# Patient Record
Sex: Female | Born: 1990 | Race: White | Hispanic: No | Marital: Single | Smoking: Current every day smoker
Health system: Southern US, Community
[De-identification: ages and names within clinical notes are randomized; demographics above are authoritative.]

## PROBLEM LIST (undated history)

## (undated) DIAGNOSIS — I471 Supraventricular tachycardia, unspecified: Secondary | ICD-10-CM

## (undated) DIAGNOSIS — K219 Gastro-esophageal reflux disease without esophagitis: Secondary | ICD-10-CM

## (undated) DIAGNOSIS — M25569 Pain in unspecified knee: Secondary | ICD-10-CM

## (undated) DIAGNOSIS — N83209 Unspecified ovarian cyst, unspecified side: Secondary | ICD-10-CM

## (undated) DIAGNOSIS — Q760 Spina bifida occulta: Secondary | ICD-10-CM

## (undated) DIAGNOSIS — I499 Cardiac arrhythmia, unspecified: Secondary | ICD-10-CM

## (undated) DIAGNOSIS — I2699 Other pulmonary embolism without acute cor pulmonale: Secondary | ICD-10-CM

## (undated) DIAGNOSIS — F419 Anxiety disorder, unspecified: Secondary | ICD-10-CM

## (undated) DIAGNOSIS — T7840XA Allergy, unspecified, initial encounter: Secondary | ICD-10-CM

## (undated) DIAGNOSIS — I82439 Acute embolism and thrombosis of unspecified popliteal vein: Secondary | ICD-10-CM

## (undated) DIAGNOSIS — S82891A Other fracture of right lower leg, initial encounter for closed fracture: Secondary | ICD-10-CM

## (undated) DIAGNOSIS — I82409 Acute embolism and thrombosis of unspecified deep veins of unspecified lower extremity: Secondary | ICD-10-CM

## (undated) DIAGNOSIS — K429 Umbilical hernia without obstruction or gangrene: Secondary | ICD-10-CM

## (undated) DIAGNOSIS — R1032 Left lower quadrant pain: Secondary | ICD-10-CM

## (undated) DIAGNOSIS — K589 Irritable bowel syndrome without diarrhea: Secondary | ICD-10-CM

## (undated) DIAGNOSIS — R0609 Other forms of dyspnea: Secondary | ICD-10-CM

## (undated) DIAGNOSIS — R002 Palpitations: Secondary | ICD-10-CM

## (undated) DIAGNOSIS — E7212 Methylenetetrahydrofolate reductase deficiency: Secondary | ICD-10-CM

## (undated) DIAGNOSIS — M797 Fibromyalgia: Secondary | ICD-10-CM

## (undated) DIAGNOSIS — S82899A Other fracture of unspecified lower leg, initial encounter for closed fracture: Secondary | ICD-10-CM

## (undated) DIAGNOSIS — G43109 Migraine with aura, not intractable, without status migrainosus: Secondary | ICD-10-CM

## (undated) DIAGNOSIS — Z1589 Genetic susceptibility to other disease: Secondary | ICD-10-CM

## (undated) HISTORY — DX: Supraventricular tachycardia, unspecified: I47.10

## (undated) HISTORY — DX: Unspecified ovarian cyst, unspecified side: N83.209

## (undated) HISTORY — DX: Other fracture of unspecified lower leg, initial encounter for closed fracture: S82.899A

## (undated) HISTORY — PX: COLONOSCOPY: SHX174

## (undated) HISTORY — DX: Pain in unspecified knee: M25.569

## (undated) HISTORY — DX: Allergy, unspecified, initial encounter: T78.40XA

## (undated) HISTORY — DX: Supraventricular tachycardia: I47.1

## (undated) HISTORY — DX: Acute embolism and thrombosis of unspecified popliteal vein: I82.439

## (undated) HISTORY — DX: Umbilical hernia without obstruction or gangrene: K42.9

## (undated) HISTORY — DX: Left lower quadrant pain: R10.32

## (undated) HISTORY — DX: Irritable bowel syndrome, unspecified: K58.9

## (undated) HISTORY — PX: OVARIAN CYST SURGERY: SHX726

## (undated) HISTORY — DX: Other fracture of right lower leg, initial encounter for closed fracture: S82.891A

## (undated) HISTORY — DX: Other forms of dyspnea: R06.09

## (undated) HISTORY — DX: Migraine with aura, not intractable, without status migrainosus: G43.109

## (undated) HISTORY — DX: Palpitations: R00.2

## (undated) HISTORY — DX: Spina bifida occulta: Q76.0

## (undated) HISTORY — PX: UPPER GI ENDOSCOPY: SHX6162

## (undated) HISTORY — DX: Other pulmonary embolism without acute cor pulmonale: I26.99

---
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
## 2012-11-06 ENCOUNTER — Emergency Department: Payer: Self-pay | Admitting: Emergency Medicine

## 2012-11-06 LAB — CBC
HGB: 13.7 g/dL (ref 12.0–16.0)
MCH: 30.5 pg (ref 26.0–34.0)
MCHC: 32.8 g/dL (ref 32.0–36.0)
MCV: 93 fL (ref 80–100)

## 2012-11-06 LAB — COMPREHENSIVE METABOLIC PANEL
Albumin: 3.9 g/dL (ref 3.4–5.0)
BUN: 10 mg/dL (ref 7–18)
Chloride: 106 mmol/L (ref 98–107)
EGFR (African American): >60
Glucose: 87 mg/dL (ref 65–99)
Osmolality: 278 (ref 275–301)
Potassium: 3.6 mmol/L (ref 3.5–5.1)
SGOT(AST): 19 U/L (ref 15–37)
SGPT (ALT): 20 U/L (ref 12–78)
Sodium: 140 mmol/L (ref 136–145)
Total Protein: 7.7 g/dL (ref 6.4–8.2)

## 2012-11-06 LAB — URINALYSIS, COMPLETE
Bilirubin,UR: NEGATIVE
Glucose,UR: NEGATIVE mg/dL (ref 0–75)
Ketone: NEGATIVE
Ph: 8 (ref 4.5–8.0)
Protein: NEGATIVE
Specific Gravity: 1.017 (ref 1.003–1.030)
WBC UR: 2 /HPF (ref 0–5)

## 2013-02-02 ENCOUNTER — Emergency Department: Payer: Self-pay | Admitting: Internal Medicine

## 2013-02-27 ENCOUNTER — Emergency Department: Payer: Self-pay | Admitting: Emergency Medicine

## 2013-02-27 LAB — COMPREHENSIVE METABOLIC PANEL
Albumin: 3.9 g/dL (ref 3.4–5.0)
Calcium, Total: 8.7 mg/dL (ref 8.5–10.1)
Co2: 24 mmol/L (ref 21–32)
Creatinine: 0.76 mg/dL (ref 0.60–1.30)
EGFR (African American): >60
Glucose: 83 mg/dL (ref 65–99)
Osmolality: 282 (ref 275–301)
SGOT(AST): 20 U/L (ref 15–37)
SGPT (ALT): 17 U/L (ref 12–78)
Sodium: 142 mmol/L (ref 136–145)
Total Protein: 7.4 g/dL (ref 6.4–8.2)

## 2013-02-27 LAB — URINALYSIS, COMPLETE
Bilirubin,UR: NEGATIVE
Blood: NEGATIVE
Glucose,UR: NEGATIVE mg/dL (ref 0–75)
Ketone: NEGATIVE
Nitrite: NEGATIVE
Ph: 6 (ref 4.5–8.0)
RBC,UR: <1 /HPF (ref 0–5)
Specific Gravity: 1.021 (ref 1.003–1.030)
Squamous Epithelial: 2

## 2013-02-27 LAB — CBC
HCT: 38.2 % (ref 35.0–47.0)
HGB: 13.4 g/dL (ref 12.0–16.0)
MCHC: 34.9 g/dL (ref 32.0–36.0)
MCV: 90 fL (ref 80–100)
RBC: 4.25 10*6/uL (ref 3.80–5.20)
WBC: 7 10*3/uL (ref 3.6–11.0)

## 2013-03-01 ENCOUNTER — Ambulatory Visit: Payer: Self-pay | Admitting: Family Medicine

## 2013-03-01 LAB — CBC WITH DIFFERENTIAL/PLATELET
Basophil #: 0 10*3/uL (ref 0.0–0.1)
Basophil %: 0.4 %
Eosinophil #: 0.1 10*3/uL (ref 0.0–0.7)
Eosinophil %: 1.8 %
HCT: 39.3 % (ref 35.0–47.0)
HGB: 13.3 g/dL (ref 12.0–16.0)
Lymphocyte #: 2.3 10*3/uL (ref 1.0–3.6)
Lymphocyte %: 29.8 %
MCHC: 34 g/dL (ref 32.0–36.0)
MCV: 91 fL (ref 80–100)
Monocyte #: 0.5 x10 3/mm (ref 0.2–0.9)
Neutrophil %: 61.9 %
Platelet: 158 10*3/uL (ref 150–440)
RDW: 12.7 % (ref 11.5–14.5)
WBC: 7.9 10*3/uL (ref 3.6–11.0)

## 2013-03-01 LAB — BASIC METABOLIC PANEL
Anion Gap: 6 — ABNORMAL LOW (ref 7–16)
BUN: 14 mg/dL (ref 7–18)
Calcium, Total: 8.5 mg/dL (ref 8.5–10.1)
Chloride: 111 mmol/L — ABNORMAL HIGH (ref 98–107)
Co2: 25 mmol/L (ref 21–32)
EGFR (Non-African Amer.): >60
Glucose: 83 mg/dL (ref 65–99)
Osmolality: 283 (ref 275–301)
Potassium: 3.4 mmol/L — ABNORMAL LOW (ref 3.5–5.1)
Sodium: 142 mmol/L (ref 136–145)

## 2013-03-01 LAB — HEPATIC FUNCTION PANEL A (ARMC)
Albumin: 4 g/dL (ref 3.4–5.0)
Alkaline Phosphatase: 72 U/L (ref 50–136)
Bilirubin, Direct: 0.3 mg/dL — ABNORMAL HIGH (ref 0.00–0.20)
SGOT(AST): 20 U/L (ref 15–37)
SGPT (ALT): 18 U/L (ref 12–78)
Total Protein: 7.4 g/dL (ref 6.4–8.2)

## 2013-03-01 LAB — AMYLASE: Amylase: 41 U/L (ref 25–115)

## 2013-03-02 ENCOUNTER — Emergency Department: Payer: Self-pay | Admitting: Unknown Physician Specialty

## 2013-03-02 LAB — CBC
MCH: 31.5 pg (ref 26.0–34.0)
MCV: 91 fL (ref 80–100)
Platelet: 139 10*3/uL — ABNORMAL LOW (ref 150–440)
RBC: 4.12 10*6/uL (ref 3.80–5.20)
RDW: 12.5 % (ref 11.5–14.5)
WBC: 8.7 10*3/uL (ref 3.6–11.0)

## 2013-03-02 LAB — COMPREHENSIVE METABOLIC PANEL
Albumin: 3.8 g/dL (ref 3.4–5.0)
Alkaline Phosphatase: 78 U/L (ref 50–136)
Anion Gap: 6 — ABNORMAL LOW (ref 7–16)
BUN: 11 mg/dL (ref 7–18)
Bilirubin,Total: 2 mg/dL — ABNORMAL HIGH (ref 0.2–1.0)
Creatinine: 0.77 mg/dL (ref 0.60–1.30)
EGFR (African American): >60
EGFR (Non-African Amer.): >60
Osmolality: 279 (ref 275–301)
Potassium: 3.5 mmol/L (ref 3.5–5.1)
Sodium: 141 mmol/L (ref 136–145)
Total Protein: 7.1 g/dL (ref 6.4–8.2)

## 2013-03-02 LAB — SEDIMENTATION RATE: Erythrocyte Sed Rate: 6 mm/hr (ref 0–20)

## 2013-03-02 LAB — LIPASE, BLOOD: Lipase: 123 U/L (ref 73–393)

## 2013-03-16 ENCOUNTER — Inpatient Hospital Stay: Payer: Self-pay | Admitting: Surgery

## 2013-03-16 LAB — URINALYSIS, COMPLETE
Bilirubin,UR: NEGATIVE
Blood: NEGATIVE
Ketone: NEGATIVE
Nitrite: NEGATIVE
Ph: 8 (ref 4.5–8.0)
Protein: NEGATIVE
RBC,UR: 1 /HPF (ref 0–5)
Specific Gravity: 1.02 (ref 1.003–1.030)
Squamous Epithelial: 1

## 2013-03-16 LAB — CBC
HCT: 38.6 % (ref 35.0–47.0)
MCH: 31.5 pg (ref 26.0–34.0)
MCV: 91 fL (ref 80–100)
Platelet: 133 10*3/uL — ABNORMAL LOW (ref 150–440)
RDW: 12.7 % (ref 11.5–14.5)
WBC: 6.2 10*3/uL (ref 3.6–11.0)

## 2013-03-16 LAB — COMPREHENSIVE METABOLIC PANEL
Alkaline Phosphatase: 60 U/L (ref 50–136)
Anion Gap: 6 — ABNORMAL LOW (ref 7–16)
Calcium, Total: 8.6 mg/dL (ref 8.5–10.1)
Co2: 27 mmol/L (ref 21–32)
Creatinine: 0.78 mg/dL (ref 0.60–1.30)
EGFR (African American): >60
Glucose: 76 mg/dL (ref 65–99)
Potassium: 3.8 mmol/L (ref 3.5–5.1)
SGOT(AST): 23 U/L (ref 15–37)

## 2013-03-16 LAB — LIPASE, BLOOD: Lipase: 143 U/L (ref 73–393)

## 2013-03-17 LAB — COMPREHENSIVE METABOLIC PANEL
Albumin: 3.8 g/dL (ref 3.4–5.0)
Alkaline Phosphatase: 63 U/L (ref 50–136)
Anion Gap: 6 — ABNORMAL LOW (ref 7–16)
Bilirubin,Total: 1.8 mg/dL — ABNORMAL HIGH (ref 0.2–1.0)
Co2: 27 mmol/L (ref 21–32)
EGFR (Non-African Amer.): >60
Glucose: 78 mg/dL (ref 65–99)
Osmolality: 280 (ref 275–301)
Potassium: 4.1 mmol/L (ref 3.5–5.1)
SGPT (ALT): 152 U/L — ABNORMAL HIGH (ref 12–78)
Sodium: 141 mmol/L (ref 136–145)
Total Protein: 7 g/dL (ref 6.4–8.2)

## 2013-03-17 LAB — CBC WITH DIFFERENTIAL/PLATELET
Comment - H1-Com1: NORMAL
Eosinophil: 5 %
HCT: 40.1 % (ref 35.0–47.0)
Lymphocytes: 44 %
MCH: 31.1 pg (ref 26.0–34.0)
MCHC: 34.3 g/dL (ref 32.0–36.0)
MCV: 91 fL (ref 80–100)
Platelet: 139 10*3/uL — ABNORMAL LOW (ref 150–440)
RBC: 4.42 10*6/uL (ref 3.80–5.20)
RDW: 12.8 % (ref 11.5–14.5)
WBC: 6.4 10*3/uL (ref 3.6–11.0)

## 2013-03-22 ENCOUNTER — Emergency Department: Payer: Self-pay | Admitting: Emergency Medicine

## 2013-03-22 ENCOUNTER — Ambulatory Visit: Payer: Self-pay | Admitting: Gastroenterology

## 2013-03-22 LAB — CBC WITH DIFFERENTIAL/PLATELET
Basophil #: 0 10*3/uL (ref 0.0–0.1)
Basophil %: 0.4 %
Eosinophil #: 0.3 10*3/uL (ref 0.0–0.7)
Eosinophil %: 3.9 %
HCT: 39.1 % (ref 35.0–47.0)
HGB: 13.4 g/dL (ref 12.0–16.0)
Lymphocyte #: 3.1 10*3/uL (ref 1.0–3.6)
MCV: 90 fL (ref 80–100)
Monocyte #: 0.5 x10 3/mm (ref 0.2–0.9)
Monocyte %: 6.6 %
Neutrophil #: 3.2 10*3/uL (ref 1.4–6.5)
Neutrophil %: 45.3 %
Platelet: 133 10*3/uL — ABNORMAL LOW (ref 150–440)
RDW: 12.9 % (ref 11.5–14.5)

## 2013-03-22 LAB — URINALYSIS, COMPLETE
Glucose,UR: NEGATIVE mg/dL (ref 0–75)
Ketone: NEGATIVE
Leukocyte Esterase: NEGATIVE
Nitrite: NEGATIVE
Protein: NEGATIVE
Squamous Epithelial: NONE SEEN

## 2013-03-22 LAB — GC/CHLAMYDIA PROBE AMP

## 2013-05-12 ENCOUNTER — Emergency Department: Payer: Self-pay | Admitting: Emergency Medicine

## 2013-05-12 LAB — COMPREHENSIVE METABOLIC PANEL
Alkaline Phosphatase: 83 U/L (ref 50–136)
Anion Gap: 6 — ABNORMAL LOW (ref 7–16)
BUN: 11 mg/dL (ref 7–18)
Bilirubin,Total: 0.8 mg/dL (ref 0.2–1.0)
Calcium, Total: 8.9 mg/dL (ref 8.5–10.1)
Chloride: 109 mmol/L — ABNORMAL HIGH (ref 98–107)
EGFR (African American): >60
EGFR (Non-African Amer.): >60
Glucose: 82 mg/dL (ref 65–99)
Osmolality: 278 (ref 275–301)
SGOT(AST): 17 U/L (ref 15–37)
SGPT (ALT): 14 U/L (ref 12–78)
Total Protein: 7.5 g/dL (ref 6.4–8.2)

## 2013-05-12 LAB — CBC
HCT: 39.1 % (ref 35.0–47.0)
HGB: 13.3 g/dL (ref 12.0–16.0)
Platelet: 149 10*3/uL — ABNORMAL LOW (ref 150–440)
RDW: 12.3 % (ref 11.5–14.5)
WBC: 7.2 10*3/uL (ref 3.6–11.0)

## 2013-06-01 ENCOUNTER — Emergency Department: Payer: Self-pay | Admitting: Emergency Medicine

## 2013-06-01 LAB — BASIC METABOLIC PANEL
BUN: 12 mg/dL (ref 7–18)
Calcium, Total: 8.9 mg/dL (ref 8.5–10.1)
Chloride: 110 mmol/L — ABNORMAL HIGH (ref 98–107)
EGFR (African American): >60
EGFR (Non-African Amer.): >60
Glucose: 106 mg/dL — ABNORMAL HIGH (ref 65–99)
Osmolality: 280 (ref 275–301)
Potassium: 3.8 mmol/L (ref 3.5–5.1)

## 2013-06-01 LAB — CBC
HCT: 38.2 % (ref 35.0–47.0)
HGB: 13.1 g/dL (ref 12.0–16.0)
MCH: 31.2 pg (ref 26.0–34.0)
MCHC: 34.3 g/dL (ref 32.0–36.0)
MCV: 91 fL (ref 80–100)
Platelet: 153 10*3/uL (ref 150–440)
WBC: 7.7 10*3/uL (ref 3.6–11.0)

## 2013-06-01 LAB — TROPONIN I: Troponin-I: <0.02 ng/mL

## 2013-07-14 ENCOUNTER — Emergency Department: Payer: Self-pay | Admitting: Internal Medicine

## 2013-08-04 ENCOUNTER — Emergency Department: Payer: Self-pay | Admitting: Emergency Medicine

## 2013-08-23 ENCOUNTER — Emergency Department: Payer: Self-pay | Admitting: Emergency Medicine

## 2013-08-24 HISTORY — PX: APPENDECTOMY: SHX54

## 2013-08-24 HISTORY — PX: KNEE ARTHROSCOPY: SUR90

## 2013-09-17 ENCOUNTER — Emergency Department: Payer: Self-pay | Admitting: Emergency Medicine

## 2013-09-17 LAB — CBC
HCT: 41.4 % (ref 35.0–47.0)
HGB: 13.9 g/dL (ref 12.0–16.0)
MCH: 30.6 pg (ref 26.0–34.0)
MCHC: 33.6 g/dL (ref 32.0–36.0)
MCV: 91 fL (ref 80–100)
PLATELETS: 154 10*3/uL (ref 150–440)
RBC: 4.54 10*6/uL (ref 3.80–5.20)
RDW: 12.2 % (ref 11.5–14.5)
WBC: 6.9 10*3/uL (ref 3.6–11.0)

## 2013-09-17 LAB — COMPREHENSIVE METABOLIC PANEL
ALK PHOS: 71 U/L
Albumin: 3.9 g/dL (ref 3.4–5.0)
Anion Gap: 7 (ref 7–16)
BUN: 13 mg/dL (ref 7–18)
Bilirubin,Total: 0.4 mg/dL (ref 0.2–1.0)
Calcium, Total: 8.8 mg/dL (ref 8.5–10.1)
Chloride: 109 mmol/L — ABNORMAL HIGH (ref 98–107)
Co2: 22 mmol/L (ref 21–32)
Creatinine: 0.66 mg/dL (ref 0.60–1.30)
EGFR (African American): >60
EGFR (Non-African Amer.): >60
GLUCOSE: 86 mg/dL (ref 65–99)
Osmolality: 275 (ref 275–301)
POTASSIUM: 3.3 mmol/L — AB (ref 3.5–5.1)
SGOT(AST): 21 U/L (ref 15–37)
SGPT (ALT): 15 U/L (ref 12–78)
Sodium: 138 mmol/L (ref 136–145)
TOTAL PROTEIN: 7.5 g/dL (ref 6.4–8.2)

## 2013-09-17 LAB — TROPONIN I: Troponin-I: <0.02 ng/mL

## 2013-09-25 ENCOUNTER — Emergency Department: Payer: Self-pay | Admitting: Emergency Medicine

## 2013-11-08 ENCOUNTER — Ambulatory Visit: Payer: Self-pay | Admitting: Surgery

## 2013-11-13 ENCOUNTER — Ambulatory Visit: Payer: Self-pay | Admitting: Surgery

## 2013-11-14 ENCOUNTER — Emergency Department: Payer: Self-pay | Admitting: Emergency Medicine

## 2013-11-14 LAB — COMPREHENSIVE METABOLIC PANEL
ALT: 52 U/L (ref 12–78)
ANION GAP: 7 (ref 7–16)
Albumin: 4 g/dL (ref 3.4–5.0)
Alkaline Phosphatase: 76 U/L
BILIRUBIN TOTAL: 2.2 mg/dL — AB (ref 0.2–1.0)
BUN: 17 mg/dL (ref 7–18)
CO2: 23 mmol/L (ref 21–32)
Calcium, Total: 8.5 mg/dL (ref 8.5–10.1)
Chloride: 108 mmol/L — ABNORMAL HIGH (ref 98–107)
Creatinine: 0.84 mg/dL (ref 0.60–1.30)
Glucose: 85 mg/dL (ref 65–99)
Osmolality: 276 (ref 275–301)
Potassium: 3 mmol/L — ABNORMAL LOW (ref 3.5–5.1)
SGOT(AST): 85 U/L — ABNORMAL HIGH (ref 15–37)
Sodium: 138 mmol/L (ref 136–145)
Total Protein: 7.5 g/dL (ref 6.4–8.2)

## 2013-11-14 LAB — DRUG SCREEN, URINE
AMPHETAMINES, UR SCREEN: NEGATIVE (ref ?–1000)
BARBITURATES, UR SCREEN: NEGATIVE (ref ?–200)
Benzodiazepine, Ur Scrn: NEGATIVE (ref ?–200)
Cannabinoid 50 Ng, Ur ~~LOC~~: NEGATIVE (ref ?–50)
Cocaine Metabolite,Ur ~~LOC~~: NEGATIVE (ref ?–300)
MDMA (ECSTASY) UR SCREEN: NEGATIVE (ref ?–500)
METHADONE, UR SCREEN: NEGATIVE (ref ?–300)
OPIATE, UR SCREEN: NEGATIVE (ref ?–300)
PHENCYCLIDINE (PCP) UR S: NEGATIVE (ref ?–25)
TRICYCLIC, UR SCREEN: NEGATIVE (ref ?–1000)

## 2013-11-14 LAB — URINALYSIS, COMPLETE
BILIRUBIN, UR: NEGATIVE
Bacteria: NONE SEEN
Blood: NEGATIVE
Glucose,UR: NEGATIVE mg/dL (ref 0–75)
LEUKOCYTE ESTERASE: NEGATIVE
Nitrite: NEGATIVE
PH: 5 (ref 4.5–8.0)
Protein: NEGATIVE
RBC,UR: 1 /HPF (ref 0–5)
SPECIFIC GRAVITY: 1.028 (ref 1.003–1.030)

## 2013-11-14 LAB — LIPASE, BLOOD: LIPASE: 212 U/L (ref 73–393)

## 2013-11-14 LAB — CBC
HCT: 41.1 % (ref 35.0–47.0)
HGB: 13.8 g/dL (ref 12.0–16.0)
MCH: 30.6 pg (ref 26.0–34.0)
MCHC: 33.5 g/dL (ref 32.0–36.0)
MCV: 92 fL (ref 80–100)
Platelet: 115 10*3/uL — ABNORMAL LOW (ref 150–440)
RBC: 4.5 10*6/uL (ref 3.80–5.20)
RDW: 12.6 % (ref 11.5–14.5)
WBC: 6.6 10*3/uL (ref 3.6–11.0)

## 2013-11-14 LAB — TROPONIN I

## 2013-12-18 ENCOUNTER — Ambulatory Visit: Payer: Self-pay | Admitting: Surgery

## 2013-12-18 LAB — CBC WITH DIFFERENTIAL/PLATELET
Basophil #: 0 10*3/uL (ref 0.0–0.1)
Basophil %: 0.6 %
Eosinophil #: 0.4 10*3/uL (ref 0.0–0.7)
Eosinophil %: 5.1 %
HCT: 39.8 % (ref 35.0–47.0)
HGB: 13.6 g/dL (ref 12.0–16.0)
Lymphocyte #: 2.1 10*3/uL (ref 1.0–3.6)
Lymphocyte %: 28.6 %
MCH: 31 pg (ref 26.0–34.0)
MCHC: 34.1 g/dL (ref 32.0–36.0)
MCV: 91 fL (ref 80–100)
MONO ABS: 0.4 x10 3/mm (ref 0.2–0.9)
Monocyte %: 5.9 %
Neutrophil #: 4.4 10*3/uL (ref 1.4–6.5)
Neutrophil %: 59.8 %
PLATELETS: 153 10*3/uL (ref 150–440)
RBC: 4.38 10*6/uL (ref 3.80–5.20)
RDW: 12.4 % (ref 11.5–14.5)
WBC: 7.3 10*3/uL (ref 3.6–11.0)

## 2013-12-18 LAB — BASIC METABOLIC PANEL
Anion Gap: 7 (ref 7–16)
BUN: 13 mg/dL (ref 7–18)
Calcium, Total: 8.8 mg/dL (ref 8.5–10.1)
Chloride: 109 mmol/L — ABNORMAL HIGH (ref 98–107)
Co2: 25 mmol/L (ref 21–32)
Creatinine: 0.52 mg/dL — ABNORMAL LOW (ref 0.60–1.30)
EGFR (African American): >60
EGFR (Non-African Amer.): >60
Glucose: 78 mg/dL (ref 65–99)
OSMOLALITY: 280 (ref 275–301)
Potassium: 3.9 mmol/L (ref 3.5–5.1)
Sodium: 141 mmol/L (ref 136–145)

## 2013-12-25 ENCOUNTER — Ambulatory Visit: Payer: Self-pay | Admitting: Surgery

## 2014-01-18 ENCOUNTER — Other Ambulatory Visit: Payer: Self-pay | Admitting: Neurology

## 2014-02-26 ENCOUNTER — Emergency Department: Payer: Self-pay | Admitting: Emergency Medicine

## 2014-05-15 ENCOUNTER — Ambulatory Visit: Payer: Self-pay | Admitting: Family Medicine

## 2014-06-15 ENCOUNTER — Ambulatory Visit: Payer: Self-pay

## 2014-06-21 ENCOUNTER — Ambulatory Visit: Payer: Self-pay | Admitting: Gastroenterology

## 2014-06-23 ENCOUNTER — Emergency Department: Payer: Self-pay | Admitting: Emergency Medicine

## 2014-07-02 ENCOUNTER — Ambulatory Visit: Payer: Self-pay | Admitting: Family Medicine

## 2014-07-20 ENCOUNTER — Emergency Department: Payer: Self-pay | Admitting: Emergency Medicine

## 2014-07-20 LAB — HEPATIC FUNCTION PANEL A (ARMC)
ALBUMIN: 3.9 g/dL (ref 3.4–5.0)
ALK PHOS: 80 U/L
BILIRUBIN DIRECT: 0.2 mg/dL (ref 0.0–0.2)
BILIRUBIN TOTAL: 1.1 mg/dL — AB (ref 0.2–1.0)
SGOT(AST): 13 U/L — ABNORMAL LOW (ref 15–37)
SGPT (ALT): 17 U/L
TOTAL PROTEIN: 7.3 g/dL (ref 6.4–8.2)

## 2014-07-20 LAB — URINALYSIS, COMPLETE
BILIRUBIN, UR: NEGATIVE
BLOOD: NEGATIVE
GLUCOSE, UR: NEGATIVE mg/dL (ref 0–75)
Leukocyte Esterase: NEGATIVE
NITRITE: NEGATIVE
PROTEIN: NEGATIVE
Ph: 5 (ref 4.5–8.0)
RBC,UR: 2 /HPF (ref 0–5)
SPECIFIC GRAVITY: 1.023 (ref 1.003–1.030)
WBC UR: 1 /HPF (ref 0–5)

## 2014-07-20 LAB — CBC
HCT: 39.9 % (ref 35.0–47.0)
HGB: 13.1 g/dL (ref 12.0–16.0)
MCH: 30.5 pg (ref 26.0–34.0)
MCHC: 33 g/dL (ref 32.0–36.0)
MCV: 93 fL (ref 80–100)
PLATELETS: 149 10*3/uL — AB (ref 150–440)
RBC: 4.3 10*6/uL (ref 3.80–5.20)
RDW: 12.4 % (ref 11.5–14.5)
WBC: 8.2 10*3/uL (ref 3.6–11.0)

## 2014-07-20 LAB — BASIC METABOLIC PANEL
ANION GAP: 7 (ref 7–16)
BUN: 12 mg/dL (ref 7–18)
Calcium, Total: 8.5 mg/dL (ref 8.5–10.1)
Chloride: 109 mmol/L — ABNORMAL HIGH (ref 98–107)
Co2: 24 mmol/L (ref 21–32)
Creatinine: 0.74 mg/dL (ref 0.60–1.30)
EGFR (Non-African Amer.): >60
Glucose: 91 mg/dL (ref 65–99)
Osmolality: 279 (ref 275–301)
POTASSIUM: 3.3 mmol/L — AB (ref 3.5–5.1)
SODIUM: 140 mmol/L (ref 136–145)

## 2014-07-20 LAB — LIPASE, BLOOD: Lipase: 155 U/L (ref 73–393)

## 2014-07-20 LAB — TROPONIN I

## 2014-08-24 HISTORY — PX: HERNIA REPAIR: SHX51

## 2014-10-16 DIAGNOSIS — M25569 Pain in unspecified knee: Secondary | ICD-10-CM

## 2014-10-16 HISTORY — DX: Pain in unspecified knee: M25.569

## 2014-11-05 ENCOUNTER — Emergency Department: Payer: Self-pay | Admitting: Student

## 2014-12-14 NOTE — Consult Note (Signed)
Brief Consult Note: Diagnosis: RLQ pain.   Patient was seen by consultant.   Discussed with Attending MD.   Comments: 5 day h/o RLQ pain, getting worse, no change in character or location. Assoc w/ nausea, no vomiting, and 3 days of diarrhea. Stool color now almost white. No fever. No sick contacts. abdomen slightly distended, completely soft and very non-tender, even to forceful RLQ palpation. No rebound, no guarding, no peritoneal irritation. Reviewed CT and report. Extremely low probability for acute appendicitis. Advised hydration, avoiding milk and milk products for 1 - 2 weeks, return for fever (> 100.5), and f/u w/ GI doc as scheduled in 13 days.  Electronic Signatures: Consuela Mimes (MD)  (Signed 10-Jul-14 18:55)  Authored: Brief Consult Note   Last Updated: 10-Jul-14 18:55 by Consuela Mimes (MD)

## 2014-12-14 NOTE — H&P (Signed)
   Subjective/Chief Complaint RLQ pain x 3 weeks, subjective chills, poor appetite, ? pericecal mass vs appendix vs ovary on CT   History of Present Illness Ellen Jackson is a pleasant 24 yo F with a history of ovarian cysts and cesarian sections who presents with now 2-3 weeks of RLQ pain.  She presented initially on July 7.  Ultrasound did not visualize right ovary and CT scan showed pericecal soft tissue mass.  clinically not felt to have appendicitis at that time.  Since then she has seen Dr. Star Age of [facility] who felt that this was not consistent with ovarian cysts.  She has subjective chills, Tempt to 99.6.  Poor appetite.  Has been constipated secondary to PO pain meds and has been taken miralax.  Is convinced that it is her appendix.  CT shows similar findings as before.   Past History s/p cesarian sections x 2 h/o ovarian cysts h/o MTHFR heterogenicity s/p cholecystectomy   Past Med/Surgical Hx:  MTHFR:   Ovarian Cyst:   Irritable Bowel Syndrome:   Migraines:   Cyst on Ovaries:   SVT:   gallbladder removed:   ALLERGIES:  Morphine: Chest Pain, Hives  nubain: Resp. Distress  shakes,  allegery to compaizine: Other, Agitation  ondansetron: Hives  Codeine: Dizzy/Fainting  Latex: Hives, Itching  Reglan: Other  Phenergan: Other  Family and Social History:  Family History Hypertension  Diabetes Mellitus  Cancer  F/h of MTHFR mutation in mother, pancreatitic cancer, hereditary pancreatitis   Social History positive  tobacco, negative ETOH, 2-3 cigarettes per day   + Tobacco Current (within 1 year)   Place of Living [place] children   Review of Systems:  Subjective/Chief Complaint RLQ pain, anorexia, subjective chillls   Fever/Chills Yes   Cough No   Sputum No   Abdominal Pain Yes   Diarrhea No   Constipation Yes   Nausea/Vomiting Yes   SOB/DOE No   Chest Pain No   Dysuria No   Tolerating Diet No  Nauseated   Physical Exam:  GEN well  developed, well nourished, no acute distress, thin   HEENT pink conjunctivae, PERRL, hearing intact to voice   RESP normal resp effort  clear BS  no use of accessory muscles   CARD regular rate  no murmur  no thrills  No LE edema   ABD positive tenderness  denies Flank Tenderness  no liver/spleen enlargement  no hernia  soft  distended  normal BS  no Adominal Mass   SKIN normal to palpation, No rashes, No ulcers   NEURO cranial nerves intact, negative Babinski R/L, negative rigidity, negative tremor, follows commands   PSYCH A+O to time, place, person, good insight, anxious    Assessment/Admission Diagnosis Ms. Penix is a pleasant 24 yo F with persistent RLQ pain.  ? ovarian cyst.  Low suspicion for appendicitis but will consider diagnostic laparoscopy with appendectomy.  Would like GYN input and availability in the case of non general surgery finding.   Plan Admit for workup of RLQ pain, GYN consult, possible diagnostic laparoscopy with appendectomy.   Electronic Signatures: Floyde Parkins (MD)  (Signed 24-Jul-14 17:42)  Authored: CHIEF COMPLAINT and HISTORY, PAST MEDICAL/SURGIAL HISTORY, ALLERGIES, FAMILY AND SOCIAL HISTORY, REVIEW OF SYSTEMS, PHYSICAL EXAM, ASSESSMENT AND PLAN   Last Updated: 24-Jul-14 17:42 by Floyde Parkins (MD)

## 2014-12-14 NOTE — Consult Note (Signed)
Brief Consult Note: Diagnosis: pelvic pain right sided.   Patient was seen by consultant.   Consult note dictated.   Comments: doubt ovarian cyst etiology of the pain, the cyst is only 1.3 cm. no signs of torsion or restricted blood flow.  Electronic Signatures: Erik Obey (MD)  (Signed 24-Jul-14 22:39)  Authored: Brief Consult Note   Last Updated: 24-Jul-14 22:39 by Erik Obey (MD)

## 2014-12-14 NOTE — Op Note (Signed)
PATIENT NAME:  Ellen Jackson, Ellen Jackson MR#:  [id number] DATE OF BIRTH:  09/12/1990  DATE OF PROCEDURE:  03/17/2013  PREOPERATIVE DIAGNOSIS:  Right lower quadrant pain, ovarian cyst on ultrasound, questionable right pelvic mass.   POSTOPERATIVE DIAGNOSIS:  Right lower quadrant pain.   PROCEDURE PERFORMED:  Diagnostic laparoscopy and appendectomy.   ANESTHESIA:  General.   SPECIMENS:  Appendix.   INTRAOPERATIVE FINDINGS:  Unremarkable appendix.  No pelvic mass on exploration.  Normal but "boggy" uterus.  Normal ovaries.  No Meckel's diverticulum.   SURGEON:  Dr. Rexene Edison.   ASSISTANTGardiner Sleeper, PA, student.   INTRAOPERATIVE CONSULTATIONS:  Dr. Ferne Reus for evaluation of the uterus and adnexal structures.   ESTIMATED BLOOD LOSS:  Minimal.   COMPLICATIONS:  None.   INDICATION FOR SURGERY:  Ellen Jackson is a pleasant 24 year old female with recurrent persistent right lower quadrant pain.  She had been evaluated previously on multiple occasions in our Emergency Room and there was concern for a right lower quadrant pelvic mass which could be causing her pain.  There was also a concern that this could be due to a periappendiceal abscess or periappendiceal mass as her pain did not resolve.  I had spoken with her about diagnostic laparoscopy with incidental appendectomy and management dependent on operative findings.   DETAILS OF PROCEDURE:  Informed consent was obtained.  Ellen Jackson was laid supine on the Operating Room table.  She was induced.  Endotracheal tube was placed, general anesthesia was administered.  Her abdomen was then prepped and draped in a standard surgical fashion.  A timeout was then performed correctly identifying patient name, operative site and procedure to be performed.  A supraumbilical incision was made.  This was deepened down to the fascia.  The fascia was incised.  The peritoneum was entered.  Two stay sutures were placed through the fasciotomy and a Hassan trocar was placed in the  abdomen.  The abdomen was insufflated.  A left lower quadrant and trocars were placed.  I began my examination by evaluating the patient's appendix at the base of the cecum.  Cecum was in the pelvis.  Appendix did not appear to be grossly inflamed, however on multiple occasions it had been discussed that the appendix may be a source of pain and therefore I did remove her appendix laparoscopically.  I used a IT consultant to place a hole in the base of the mesoappendix.  I then placed an endoscopic stapler with a blue load across the base of the appendix.  I then used a single fire of a laparoscopic white load to take the mesoappendix.  I then evaluated the appendiceal stump and mesoappendix and found that it was hemostatic.  I then ran approximately 2 to 3 feet of ileum to look for Meckel's diverticulum and also to look for creeping fat associated with Crohn's.  This bowel was unremarkable.  I then focused my attention to the pelvic floor and could follow the sigmoid colon down to the pelvic outlet and did not see any obvious masses.  A looked extensively at both pelvic sidewalls and did not see any masses.  I then used a grasper to lift up the uterus and looked all around the uterus.  I did evaluate her uterus.  I also evaluated her ovaries.  There were no obvious abnormalities.  There were no obvious chocolate cysts associated with endometriosis on the ovaries or adnexal or anywhere in the pelvis.  I then looked completely around her abdomen and did  not see any obvious abnormalities.  There are no hernias.  No adhesions, relatively whatsoever.  I then consulted Dr. Ferne Reus with gynecology to evaluate her adnexal structures and uterus to ensure that were not abnormalities beyond what I was able to see.  She did mention that the uterus was slightly boggy, but there were no obvious abnormalities.  No chocolate cyst.  Did mention there may be the beginning of a corpus luteum cyst on the patient's right ovary,  but otherwise no obvious etiology for patient's pain.  At that time I elected to remove all trocars and decrease the patient's pneumoperitoneum.  I did close the supraumbilical fascia site with a figure-of-eight 0 Vicryl suture in ER 6.  I then closed all skin incisions with an inverted 4-0 Monocryl deep dermal suture.  I then placed dressings over the wounds consisting of Steri-Strips, Telfa gauze and Tegaderm.  The patient was then awoken, extubated and brought to the postanesthesia care unit.  There were no immediate complications.  Needle, sponge and instrument counts were correct at the end of the procedure.    ____________________________ Ellen Jackson. Ellen Houpt, MD cal:ea D: 03/17/2013 18:00:43 ET T: 03/18/2013 02:19:08 ET JOB#: [id number]  cc: Harrell Gave A. Jozalyn Baglio, MD, <Dictator> Floyde Parkins MD ELECTRONICALLY SIGNED 03/18/2013 11:31

## 2014-12-15 NOTE — Op Note (Signed)
PATIENT NAME:  Ellen Jackson, Ellen Jackson MR#:  [id number] DATE OF BIRTH:  [birth year]  DATE OF PROCEDURE:  12/25/2013  PREOPERATIVE DIAGNOSIS: Symptomatic ventral incisional hernia centered at the umbilicus.   POSTOPERATIVE DIAGNOSIS: Symptomatic ventral incisional hernia centered at the umbilicus.   PROCEDURE PERFORMED: Ventral hernia repair with mesh, 4.3 cm circular Ventralex mesh.   SPECIMENS: None.   ESTIMATED BLOOD LOSS: Minimal.   DESCRIPTION OF PROCEDURE: With informed consent, supine position and general endotracheal anesthesia, the patient's abdomen was widely clipped of hair, prepped and draped with ChloraPrep solution followed by [place]. Timeout was observed. A curvilinear incision was fashioned below the umbilicus and carried down with sharp dissection to the fascia. The umbilical stalk was then encircled with blunt technique. The umbilical stalk was then transected off the hernia sac with a scalpel. Preperitoneal fat was then excised and discarded from the hernia sac contents. The hernia sac was resected back to the fascial edges. The fascial edges measured approximately a centimeter. The fascia was then incised superiorly along the midline for 1 cm. Finger was then inserted into the peritoneal cavity demonstrating no evidence of adhesions.  A 4.3 cm Ventralex patch was brought into the field, soaked in antibiotic solution, and inserted coated side down and secured at 4 points utilizing #0 Ethibond sutures. The hernia sac and fascia were then reapproximated along a transverse orientation utilizing interrupted figure-of-eight #0 Vicryl suture. The umbilical skin is reapproximated to the fascia utilizing 2-0 Vicryl suture. Inverted deep dermal 2-0 Vicryl sutures were placed followed by 4-0 Vicryl subcuticular in the skin followed by Steri-Strips. Sterile dressing was then applied. The patient was subsequently extubated and taken to the recovery room in stable and satisfactory condition by anesthesia  service.    ____________________________ Jeannette How Marina Gravel, MD mab:dd D: 12/25/2013 17:43:32 ET T: 12/26/2013 05:36:01 ET JOB#: [id number]  cc: Elta Guadeloupe A. Marina Gravel, MD, <Dictator> Hortencia Conradi MD ELECTRONICALLY SIGNED 12/28/2013 17:09

## 2014-12-17 LAB — SURGICAL PATHOLOGY

## 2015-01-08 ENCOUNTER — Other Ambulatory Visit: Payer: Self-pay | Admitting: Family Medicine

## 2015-01-08 DIAGNOSIS — R634 Abnormal weight loss: Secondary | ICD-10-CM

## 2015-01-08 DIAGNOSIS — R1011 Right upper quadrant pain: Secondary | ICD-10-CM

## 2015-01-08 DIAGNOSIS — R1013 Epigastric pain: Secondary | ICD-10-CM

## 2015-01-10 ENCOUNTER — Ambulatory Visit: Payer: Medicaid Other

## 2015-01-31 ENCOUNTER — Ambulatory Visit (INDEPENDENT_AMBULATORY_CARE_PROVIDER_SITE_OTHER): Payer: Medicaid Other

## 2015-01-31 DIAGNOSIS — Z308 Encounter for other contraceptive management: Secondary | ICD-10-CM

## 2015-01-31 MED ORDER — MEDROXYPROGESTERONE ACETATE 150 MG/ML IM SUSP
150.0000 mg | Freq: Once | INTRAMUSCULAR | Status: AC
  Administered 2015-01-31: 150 mg via INTRAMUSCULAR

## 2015-02-05 ENCOUNTER — Encounter: Payer: Self-pay | Admitting: Family Medicine

## 2015-02-05 ENCOUNTER — Other Ambulatory Visit: Payer: Self-pay | Admitting: Family Medicine

## 2015-02-05 ENCOUNTER — Telehealth: Payer: Self-pay | Admitting: Family Medicine

## 2015-02-05 ENCOUNTER — Ambulatory Visit (INDEPENDENT_AMBULATORY_CARE_PROVIDER_SITE_OTHER): Payer: Medicaid Other | Admitting: Family Medicine

## 2015-02-05 ENCOUNTER — Ambulatory Visit
Admission: RE | Admit: 2015-02-05 | Discharge: 2015-02-05 | Disposition: A | Discharge status: Home / Self Care | Payer: Medicaid Other | Source: Ambulatory Visit | Attending: Family Medicine | Admitting: Family Medicine

## 2015-02-05 VITALS — BP 110/72 | HR 70 | Temp 98.3°F | Ht 66.5 in | Wt 128.4 lb

## 2015-02-05 DIAGNOSIS — F329 Major depressive disorder, single episode, unspecified: Secondary | ICD-10-CM | POA: Insufficient documentation

## 2015-02-05 DIAGNOSIS — F419 Anxiety disorder, unspecified: Secondary | ICD-10-CM | POA: Insufficient documentation

## 2015-02-05 DIAGNOSIS — G43909 Migraine, unspecified, not intractable, without status migrainosus: Secondary | ICD-10-CM | POA: Insufficient documentation

## 2015-02-05 DIAGNOSIS — K589 Irritable bowel syndrome without diarrhea: Secondary | ICD-10-CM | POA: Insufficient documentation

## 2015-02-05 DIAGNOSIS — Z885 Allergy status to narcotic agent status: Secondary | ICD-10-CM | POA: Insufficient documentation

## 2015-02-05 DIAGNOSIS — R1032 Left lower quadrant pain: Secondary | ICD-10-CM

## 2015-02-05 DIAGNOSIS — Z1589 Genetic susceptibility to other disease: Secondary | ICD-10-CM | POA: Insufficient documentation

## 2015-02-05 DIAGNOSIS — Z888 Allergy status to other drugs, medicaments and biological substances status: Secondary | ICD-10-CM | POA: Diagnosis not present

## 2015-02-05 DIAGNOSIS — F32A Depression, unspecified: Secondary | ICD-10-CM | POA: Insufficient documentation

## 2015-02-05 DIAGNOSIS — R634 Abnormal weight loss: Secondary | ICD-10-CM | POA: Diagnosis present

## 2015-02-05 DIAGNOSIS — Z880 Allergy status to penicillin: Secondary | ICD-10-CM | POA: Insufficient documentation

## 2015-02-05 DIAGNOSIS — E7212 Methylenetetrahydrofolate reductase deficiency: Secondary | ICD-10-CM | POA: Insufficient documentation

## 2015-02-05 DIAGNOSIS — Z91040 Latex allergy status: Secondary | ICD-10-CM | POA: Insufficient documentation

## 2015-02-05 DIAGNOSIS — R109 Unspecified abdominal pain: Secondary | ICD-10-CM | POA: Insufficient documentation

## 2015-02-05 DIAGNOSIS — I471 Supraventricular tachycardia: Secondary | ICD-10-CM | POA: Insufficient documentation

## 2015-02-05 HISTORY — DX: Left lower quadrant pain: R10.32

## 2015-02-05 MED ORDER — OXYCODONE-ACETAMINOPHEN 5-325 MG PO TABS: 1.0000 | ORAL_TABLET | Freq: Three times a day (TID) | ORAL | Status: DC | PRN

## 2015-02-05 NOTE — Patient Instructions (Addendum)
Ovarian Cyst An ovarian cyst is a sac filled with fluid or blood. This sac is attached to the ovary. Some cysts go away on their own. Other cysts need treatment.  HOME CARE   Only take medicine as told by your doctor.  Follow up with your doctor as told.  Get regular pelvic exams and Pap tests. GET HELP IF:  Your periods are late, not regular, or painful.  You stop having periods.  Your belly (abdominal) or pelvic pain does not go away.  Your belly becomes large or puffy (swollen).  You have a hard time peeing (totally emptying your bladder).  You have pressure on your bladder.  You have pain during sex.  You feel fullness, pressure, or discomfort in your belly.  You lose weight for no reason.  You feel sick most of the time.  You have a hard time pooping (constipation).  You do not feel like eating.  You develop pimples (acne).  You have an increase in hair on your body and face.  You are gaining weight for no reason.  You think you are pregnant. GET HELP RIGHT AWAY IF:   Your belly pain gets worse.  You feel sick to your stomach (nauseous), and you throw up (vomit).  You have a fever that comes on fast.  You have belly pain while pooping (bowel movement).  Your periods are heavier than usual. MAKE SURE YOU:   Understand these instructions.  Will watch your condition.  Will get help right away if you are not doing well or get worse. Document Released: 01/27/2008 Document Revised: 05/31/2013 Document Reviewed: 04/17/2013 [facility] Patient Information 2015 [place], [place]. This information is not intended to replace advice given to you by your health care provider. Make sure you discuss any questions you have with your health care provider. Ovarian Cyst An ovarian cyst is a sac filled with fluid or blood. This sac is attached to the ovary. Some cysts go away on their own. Other cysts need treatment.  HOME CARE   Only take medicine as told by your  doctor.  Follow up with your doctor as told.  Get regular pelvic exams and Pap tests. GET HELP IF:  Your periods are late, not regular, or painful.  You stop having periods.  Your belly (abdominal) or pelvic pain does not go away.  Your belly becomes large or puffy (swollen).  You have a hard time peeing (totally emptying your bladder).  You have pressure on your bladder.  You have pain during sex.  You feel fullness, pressure, or discomfort in your belly.  You lose weight for no reason.  You feel sick most of the time.  You have a hard time pooping (constipation).  You do not feel like eating.  You develop pimples (acne).  You have an increase in hair on your body and face.  You are gaining weight for no reason.  You think you are pregnant. GET HELP RIGHT AWAY IF:   Your belly pain gets worse.  You feel sick to your stomach (nauseous), and you throw up (vomit).  You have a fever that comes on fast.  You have belly pain while pooping (bowel movement).  Your periods are heavier than usual. MAKE SURE YOU:   Understand these instructions.  Will watch your condition.  Will get help right away if you are not doing well or get worse. Document Released: 01/27/2008 Document Revised: 05/31/2013 Document Reviewed: 04/17/2013 [facility] Patient Information 2015 [place], [place]. This information  is not intended to replace advice given to you by your health care provider. Make sure you discuss any questions you have with your health care provider.  

## 2015-02-05 NOTE — Telephone Encounter (Signed)
Called to let patient know that ultrasound was normal. No sign of any cysts. No torsion. No need to go to ER. Will call again tomorrow and discuss further work up. Possibly related to her IBS. OK to fill percocet for pain control.

## 2015-02-05 NOTE — Assessment & Plan Note (Addendum)
Patient has history of ovarian cysts and several surgeries related to them in the past. Will obtain ultrasound with doppler to rule out torsion and cyst at this time. UA negative today- unlikely kidney stone without hematuria. Pregnancy test negative. Will await results of ultrasound and treat accordingly, Rx for percocet given today- only to be filled if cyst and patient not sent to the ER.

## 2015-02-05 NOTE — Progress Notes (Signed)
BP 110/72 mmHg  Pulse 70  Temp(Src) 98.3 F (36.8 C)  Ht 5' 6.5" (1.689 m)  Wt 128 lb 6.4 oz (58.242 kg)  BMI 20.42 kg/m2  SpO2 100%  LMP 01/24/2015 (Exact Date)   Subjective:    Patient ID: Ellen Jackson, female    DOB: 01-28-1991, 24 y.o.   MRN: [id number]  HPI: Ellen Jackson is a 24 y.o. female  Chief Complaint  Patient presents with  . Abdominal Pain    left lower abdomen   ABDOMINAL PAIN- Ellen Jackson notes that she has been having LLQ abdominal pain that has been getting progressively worse over the past 2 weeks and is now a 6-7/10. She notes that she has had several cysts in the past and thought it would be better when she got her depo shot on the 9th, but it didn't, it got worse. She notes that she is afraid it has torsed as she has had several surgeries and cysts removed in the past. She is otherwise feeling OK with no other concerns or complaints at this time.   Duration:2 weeks but has been getting progressively worse over that time.  Onset: gradual Severity: 6/10 Quality: sharp Location:  LLQ  Radiation: down into her thigh and into her back Frequency: constant Alleviating factors: laying on the L side Aggravating factors: bending over and tight pants Status: worse Treatments attempted: ibuprofen, advil, tylenol Fever: no Nausea: yes Vomiting: no Weight loss: no Decreased appetite: yes Diarrhea: no Constipation: no Blood in stool: no Heartburn: no Jaundice: no Rash: no Dysuria/urinary frequency: no Hematuria: no History of sexually transmitted disease: no Recurrent NSAID use: no  Relevant past medical, surgical, family and social history reviewed and updated as indicated. Interim medical history since our last visit reviewed. Allergies and medications reviewed and updated.  Review of Systems  Constitutional: Negative.   Respiratory: Negative.   Cardiovascular: Negative.   Gastrointestinal: Positive for nausea and abdominal pain. Negative for vomiting,  diarrhea, constipation, blood in stool, abdominal distention, anal bleeding and rectal pain.  Genitourinary: Positive for pelvic pain. Negative for dysuria, urgency, frequency, hematuria, flank pain, decreased urine volume, vaginal bleeding, vaginal discharge, enuresis, difficulty urinating, genital sores, vaginal pain, menstrual problem and dyspareunia.  Psychiatric/Behavioral: Negative.     Per HPI unless specifically indicated above     Objective:    BP 110/72 mmHg  Pulse 70  Temp(Src) 98.3 F (36.8 C)  Ht 5' 6.5" (1.689 m)  Wt 128 lb 6.4 oz (58.242 kg)  BMI 20.42 kg/m2  SpO2 100%  LMP 01/24/2015 (Exact Date)  Wt Readings from Last 3 Encounters:  02/05/15 128 lb 6.4 oz (58.242 kg)  01/04/15 128 lb (58.06 kg)    Physical Exam  Constitutional: She appears well-developed and well-nourished. She appears distressed.  Visibly uncomfortable, leaning forward, not wanting to walk  Cardiovascular: Normal rate, regular rhythm and normal heart sounds.  Exam reveals no gallop and no friction rub.   No murmur heard. Pulmonary/Chest: Effort normal and breath sounds normal. No respiratory distress. She has no wheezes. She has no rales. She exhibits no tenderness.  Abdominal: Soft. Bowel sounds are normal. She exhibits no distension and no mass. There is tenderness. There is guarding. There is no rebound.  Tenderness in the LLQ with some guarding, no rebound  Skin: Skin is warm and dry. She is not diaphoretic.  Psychiatric: She has a normal mood and affect. Her behavior is normal. Judgment and thought content normal.  Assessment & Plan:   Problem List Items Addressed This Visit      Other   LLQ abdominal pain - Primary    Patient has history of ovarian cysts and several surgeries related to them in the past. Will obtain ultrasound with doppler to rule out torsion and cyst at this time. UA negative today- unlikely kidney stone without hematuria. Pregnancy test negative. Will await  results of ultrasound and treat accordingly, Rx for percocet given today- only to be filled if cyst and patient not sent to the ER.       Relevant Orders   US Transvaginal Non-OB   US Pelvis Complete   UA/M w/rflx Culture, Routine   [place] Art/Ven Flow Abd Pelv Doppler   Pregnancy, urine       Follow up plan: Return if symptoms worsen or fail to improve. and pending results of her tests.

## 2015-02-06 ENCOUNTER — Telehealth: Payer: Self-pay | Admitting: Family Medicine

## 2015-02-06 LAB — UA/M W/RFLX CULTURE, ROUTINE
BILIRUBIN UA: NEGATIVE
Glucose, UA: NEGATIVE
KETONES UA: NEGATIVE
Leukocytes, UA: NEGATIVE
NITRITE UA: NEGATIVE
PH UA: 6.5 (ref 5.0–7.5)
Protein, UA: NEGATIVE
RBC UA: NEGATIVE
SPEC GRAV UA: 1.02 (ref 1.005–1.030)
UUROB: 0.2 mg/dL (ref 0.2–1.0)

## 2015-02-06 LAB — PREGNANCY, URINE: Preg Test, Ur: NEGATIVE

## 2015-02-06 NOTE — Telephone Encounter (Signed)
Called and spoke to patient. Ultrasound normal. Pain may be due to IBS. If not feeling better in a couple of days, she will let us know and we will do further work up.

## 2015-03-07 ENCOUNTER — Emergency Department: Payer: Medicaid Other

## 2015-03-07 ENCOUNTER — Encounter: Payer: Self-pay | Admitting: Emergency Medicine

## 2015-03-07 ENCOUNTER — Emergency Department
Admission: EM | Admit: 2015-03-07 | Discharge: 2015-03-07 | Disposition: A | Discharge status: Home / Self Care | Payer: Medicaid Other | Attending: Emergency Medicine | Admitting: Emergency Medicine

## 2015-03-07 DIAGNOSIS — Z880 Allergy status to penicillin: Secondary | ICD-10-CM | POA: Insufficient documentation

## 2015-03-07 DIAGNOSIS — M7981 Nontraumatic hematoma of soft tissue: Secondary | ICD-10-CM | POA: Insufficient documentation

## 2015-03-07 DIAGNOSIS — Z91040 Latex allergy status: Secondary | ICD-10-CM | POA: Insufficient documentation

## 2015-03-07 DIAGNOSIS — G8918 Other acute postprocedural pain: Secondary | ICD-10-CM | POA: Diagnosis not present

## 2015-03-07 DIAGNOSIS — Z720 Tobacco use: Secondary | ICD-10-CM | POA: Insufficient documentation

## 2015-03-07 DIAGNOSIS — M79672 Pain in left foot: Secondary | ICD-10-CM | POA: Diagnosis present

## 2015-03-07 DIAGNOSIS — M792 Neuralgia and neuritis, unspecified: Secondary | ICD-10-CM | POA: Diagnosis not present

## 2015-03-07 MED ORDER — OXYCODONE-ACETAMINOPHEN 5-325 MG PO TABS
ORAL_TABLET | ORAL | Status: AC
  Administered 2015-03-07: 2 via ORAL
  Filled 2015-03-07: qty 2

## 2015-03-07 MED ORDER — OXYCODONE-ACETAMINOPHEN 5-325 MG PO TABS
2.0000 | ORAL_TABLET | Freq: Once | ORAL | Status: AC
  Administered 2015-03-07: 2 via ORAL

## 2015-03-07 NOTE — Discharge Instructions (Signed)

## 2015-03-07 NOTE — ED Provider Notes (Signed)
[facility] Emergency Department Provider Note     Time seen: ----------------------------------------- 7:31 PM on 03/07/2015 -----------------------------------------    I have reviewed the triage vital signs and the nursing notes.   HISTORY  Chief Complaint Foot Pain    HPI Ellen Jackson is a 24 y.o. female who presents ER for pain in her left foot. Patient states she had left knee surgery almost a week ago and is having some left foot pain that starts in her foot and a sense to just above her ankle. She has not had any recent fall or trauma. She does describe some discoloration of the foot, is only here because of worsening pain. She states the left knee has been healing appropriately, has not causing her significant problems.   Past Medical History  Diagnosis Date  . SVT (supraventricular tachycardia)   . Migraine with aura   . Allergy   . Hernia, umbilical   . IBS (irritable bowel syndrome)   . Spina bifida occulta   . Ovarian cyst     Patient Active Problem List   Diagnosis Date Noted  . Anxiety 02/05/2015  . Headache, migraine 02/05/2015  . Clinical depression 02/05/2015  . 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 Paroxysmal supraventricular tachycardia 02/05/2015  . LLQ abdominal pain 02/05/2015  . Gonalgia 10/16/2014    Past Surgical History  Procedure Laterality Date  . Knee arthroscopy      X 3  . Cholecystectomy    . Cesarean section  2011  . Appendectomy    . Hernia repair      Allergies Codeine; Morphine; Nalbuphine; Ondansetron hcl; Penicillins; Latex; Metoclopramide; Prochlorperazine; and Promethazine  Social History History  Substance Use Topics  . Smoking status: Current Every Day Smoker  . Smokeless tobacco: Not on file  . Alcohol Use: No    Review of Systems Constitutional: Negative for fever. Eyes: Negative for visual changes. ENT: Negative for sore  throat. Cardiovascular: Negative for chest pain. Respiratory: Negative for shortness of breath. Gastrointestinal: Negative for abdominal pain, vomiting and diarrhea. Genitourinary: Negative for dysuria. Musculoskeletal: Positive for left foot pain Skin: Negative for rash. Neurological: Negative for headaches, focal weakness or numbness.  10-point ROS otherwise negative.  ____________________________________________   PHYSICAL EXAM:  VITAL SIGNS: ED Triage Vitals  Enc Vitals Group     BP 03/07/15 1856 106/71 mmHg     Pulse Rate 03/07/15 1856 83     Resp 03/07/15 1856 18     Temp 03/07/15 1856 98.1 F (36.7 C)     Temp Source 03/07/15 1856 Oral     SpO2 03/07/15 1856 98 %     Weight 03/07/15 1856 125 lb (56.7 kg)     Height 03/07/15 1856 5\' 7"  (1.702 m)     Head Cir --      Peak Flow --      Pain Score 03/07/15 1857 9     Pain Loc --      Pain Edu? --      Excl. in Home? --     Constitutional: Alert and oriented. Well appearing and in no distress. Eyes: Conjunctivae are normal. PERRL. Normal extraocular movements. ENT   Head: Normocephalic and atraumatic.   Nose: No congestion/rhinnorhea.   Mouth/Throat: Mucous membranes are moist.   Neck: No stridor. Hematological/Lymphatic/Immunilogical: No cervical lymphadenopathy. Cardiovascular: Normal rate, regular rhythm. Normal and symmetric distal pulses are present in all extremities. No murmurs, rubs, or  gallops. Respiratory: Normal respiratory effort without tachypnea nor retractions. Breath sounds are clear and equal bilaterally. No wheezes/rales/rhonchi. Gastrointestinal: Soft and nontender. No distention. No abdominal bruits. There is no CVA tenderness. Musculoskeletal: There is effusion and ecchymosis of the left knee joint, surgical sites appear clean dry and intact. Left lower extremity exams otherwise unremarkable, left foot is tender to touch. Neurologic:  Normal speech and language. No gross focal  neurologic deficits are appreciated. Speech is normal. No gait instability. Skin:  Skin is warm, dry and intact. No rash noted. Some ecchymosis is noted around the left knee Psychiatric: Mood and affect are normal. Speech and behavior are normal. Patient exhibits appropriate insight and judgment. ____________________________________________  ED COURSE:  Pertinent labs & imaging results that were available during my care of the patient were reviewed by me and considered in my medical decision making (see chart for details). I'm unclear as to the etiology of his postoperative pain. Patient ruled receive an ultrasound to rule out DVT in this leg ____________________________________________     RADIOLOGY Images were viewed by me  Ultrasound left lower extremity Left foot x-rays are normal ____________________________________________  FINAL ASSESSMENT AND PLAN  Postoperative left foot and leg pain  Plan: Patient has been given pain medication here, ultrasound finding and x-ray findings as dictated above. Likely patient is having neuropathic pain from left knee surgery and swelling around the left knee.   Earleen Newport, MD    Earleen Newport, MD 03/07/15 [phone]

## 2015-03-07 NOTE — ED Notes (Signed)
Left knee surg on 7/8, today having pain in left foot.  +pulse noted

## 2015-04-04 ENCOUNTER — Encounter: Payer: Self-pay | Admitting: Family Medicine

## 2015-04-04 ENCOUNTER — Other Ambulatory Visit: Payer: Self-pay | Admitting: Family Medicine

## 2015-04-04 ENCOUNTER — Ambulatory Visit (INDEPENDENT_AMBULATORY_CARE_PROVIDER_SITE_OTHER): Payer: Medicaid Other | Admitting: Family Medicine

## 2015-04-04 DIAGNOSIS — R300 Dysuria: Secondary | ICD-10-CM

## 2015-04-04 LAB — UA/M W/RFLX CULTURE, ROUTINE
Bilirubin, UA: NEGATIVE
Glucose, UA: NEGATIVE
LEUKOCYTES UA: NEGATIVE
Nitrite, UA: NEGATIVE
PH UA: 5.5 (ref 5.0–7.5)
RBC, UA: NEGATIVE
Specific Gravity, UA: 1.03 (ref 1.005–1.030)
Urobilinogen, Ur: 0.2 mg/dL (ref 0.2–1.0)

## 2015-04-04 LAB — MICROSCOPIC EXAMINATION

## 2015-04-04 NOTE — Progress Notes (Signed)
BP 104/74 mmHg  Pulse 83  Temp(Src) 98.6 F (37 C)  Wt 127 lb (57.607 kg)  SpO2 97%  LMP 01/24/2015   Subjective:    Patient ID: Ellen Jackson, female    DOB: [birth year], 24 y.o.   MRN: [id number]  HPI: Ellen Jackson is a 25 y.o. female  Chief Complaint  Patient presents with  . Back Pain   URINARY SYMPTOMS- has been having flank pain again for about a week, severe pain, got up to a 10, radiated around to her groin and was giving her sharp pains in her urethra Dysuria: no Urinary frequency: yes Urgency: yes Small volume voids: no Symptom severity: 10/10 at times, but has gotten better. Urinary incontinence: no Foul odor: no Hematuria: no Abdominal pain: no Back pain: yes Suprapubic pain/pressure: no Flank pain: yes Fever:  subjective Vomiting: no Relief with cranberry juice: no Relief with pyridium: no Status: better Previous urinary tract infection: yes Recurrent urinary tract infection: no History of sexually transmitted disease: no Treatments attempted: pyridium, cranberry and increasing fluids   Still getting some neuropathic pain in her foot after surgery, but better now than it was   Relevant past medical, surgical, family and social history reviewed and updated as indicated. Interim medical history since our last visit reviewed. Allergies and medications reviewed and updated.  Review of Systems  Constitutional: Negative.   Respiratory: Negative.   Cardiovascular: Negative.   Gastrointestinal: Negative.   Genitourinary: Negative.   Musculoskeletal: Positive for back pain. Negative for myalgias, joint swelling, arthralgias, gait problem, neck pain and neck stiffness.   Per HPI unless specifically indicated above    Objective:    BP 104/74 mmHg  Pulse 83  Temp(Src) 98.6 F (37 C)  Wt 127 lb (57.607 kg)  SpO2 97%  LMP 01/24/2015  Wt Readings from Last 3 Encounters:  04/04/15 127 lb (57.607 kg)  03/07/15 125 lb (56.7 kg)  02/05/15 128 lb 6.4 oz  (58.242 kg)    Physical Exam  Constitutional: She is oriented to person, place, and time. She appears well-developed and well-nourished. No distress.  HENT:  Head: Normocephalic and atraumatic.  Right Ear: Hearing normal.  Left Ear: Hearing normal.  Nose: Nose normal.  Eyes: Conjunctivae and lids are normal. Right eye exhibits no discharge. Left eye exhibits no discharge. No scleral icterus.  Cardiovascular: Normal rate, regular rhythm and normal heart sounds.  Exam reveals no gallop and no friction rub.   No murmur heard. Pulmonary/Chest: Effort normal and breath sounds normal. No respiratory distress. She has no wheezes. She has no rales. She exhibits no tenderness.  Abdominal: Soft. Bowel sounds are normal. She exhibits no distension and no mass. There is no tenderness. There is no rebound, no guarding and no CVA tenderness.  Mild R CVA tenderness, not severe  Musculoskeletal: Normal range of motion.  Neurological: She is alert and oriented to person, place, and time.  Skin: Skin is warm, dry and intact. No rash noted. No erythema. No pallor.  Psychiatric: She has a normal mood and affect. Her speech is normal and behavior is normal. Judgment and thought content normal. Cognition and memory are normal.  Nursing note and vitals reviewed.   Results for orders placed or performed in visit on 02/05/15  UA/M w/rflx Culture, Routine  Result Value Ref Range   Specific Gravity, UA 1.020 1.005 - 1.030   pH, UA 6.5 5.0 - 7.5   Color, UA Yellow Yellow   Appearance Ur Clear Clear  Leukocytes, UA Negative Negative   Protein, UA Negative Negative/Trace   Glucose, UA Negative Negative   Ketones, UA Negative Negative   RBC, UA Negative Negative   Bilirubin, UA Negative Negative   Urobilinogen, Ur 0.2 0.2 - 1.0 mg/dL   Nitrite, UA Negative Negative  Pregnancy, urine  Result Value Ref Range   Preg Test, Ur Negative Negative      Assessment & Plan:   Problem List Items Addressed This  Visit    None    Visit Diagnoses    Dysuria        Urine normal today, no sign of kidney stone. Could be that she already passed it. Continue to monitor. Dietary guidelines given. F/U PRN.        Follow up plan: Return if symptoms worsen or fail to improve.

## 2015-04-04 NOTE — Patient Instructions (Signed)
Dietary Guidelines to Help Prevent Kidney Stones  Your risk of kidney stones can be decreased by adjusting the foods you eat. The most important thing you can do is drink enough fluid. You should drink enough fluid to keep your urine clear or pale yellow. The following guidelines provide specific information for the type of kidney stone you have had.  GUIDELINES ACCORDING TO TYPE OF KIDNEY STONE  Calcium Oxalate Kidney Stones  · Reduce the amount of salt you eat. Foods that have a lot of salt cause your body to release excess calcium into your urine. The excess calcium can combine with a substance called oxalate to form kidney stones.  · Reduce the amount of animal protein you eat if the amount you eat is excessive. Animal protein causes your body to release excess calcium into your urine. Ask your dietitian how much protein from animal sources you should be eating.  · Avoid foods that are high in oxalates. If you take vitamins, they should have less than 500 mg of vitamin C. Your body turns vitamin C into oxalates. You do not need to avoid fruits and vegetables high in vitamin C.  Calcium Phosphate Kidney Stones  · Reduce the amount of salt you eat to help prevent the release of excess calcium into your urine.  · Reduce the amount of animal protein you eat if the amount you eat is excessive. Animal protein causes your body to release excess calcium into your urine. Ask your dietitian how much protein from animal sources you should be eating.  · Get enough calcium from food or take a calcium supplement (ask your dietitian for recommendations). Food sources of calcium that do not increase your risk of kidney stones include:  ¨ Broccoli.  ¨ Dairy products, such as cheese and yogurt.  ¨ Pudding.  Uric Acid Kidney Stones  · Do not have more than 6 oz of animal protein per day.  FOOD SOURCES  Animal Protein Sources  · Meat (all types).  · Poultry.  · Eggs.  · Fish, seafood.  Foods High in Salt  · Salt seasonings.  · Soy  sauce.  · Teriyaki sauce.  · Cured and processed meats.  · Salted crackers and snack foods.  · Fast food.  · Canned soups and most canned foods.  Foods High in Oxalates  · Grains:  ¨ Amaranth.  ¨ Barley.  ¨ Grits.  ¨ Wheat germ.  ¨ Bran.  ¨ Buckwheat flour.  ¨ All bran cereals.  ¨ Pretzels.  ¨ Whole wheat bread.  · Vegetables:  ¨ Beans (wax).  ¨ Beets and beet greens.  ¨ Collard greens.  ¨ Eggplant.  ¨ Escarole.  ¨ Leeks.  ¨ Okra.  ¨ Parsley.  ¨ Rutabagas.  ¨ Spinach.  ¨ Swiss chard.  ¨ Tomato paste.  ¨ Fried potatoes.  ¨ Sweet potatoes.  · Fruits:  ¨ Red currants.  ¨ Figs.  ¨ Kiwi.  ¨ Rhubarb.  · Meat and Other Protein Sources:  ¨ Beans (dried).  ¨ Soy burgers and other soybean products.  ¨ Miso.  ¨ Nuts (peanuts, almonds, pecans, cashews, hazelnuts).  ¨ Nut butters.  ¨ Sesame seeds and tahini (paste made of sesame seeds).  ¨ Poppy seeds.  · Beverages:  ¨ Chocolate drink mixes.  ¨ Soy milk.  ¨ Instant iced tea.  ¨ Juices made from high-oxalate fruits or vegetables.  · Other:  ¨ Carob.  ¨ Chocolate.  ¨ Fruitcake.  ¨ Marmalades.  Document Released:   12/05/2010 Document Revised: 08/15/2013 Document Reviewed: 07/07/2013  ExitCare® Patient Information ©2015 [employer]. This information is not intended to replace advice given to you by your health care provider. Make sure you discuss any questions you have with your health care provider.

## 2015-04-18 ENCOUNTER — Ambulatory Visit (INDEPENDENT_AMBULATORY_CARE_PROVIDER_SITE_OTHER): Payer: Medicaid Other

## 2015-04-18 DIAGNOSIS — Z308 Encounter for other contraceptive management: Secondary | ICD-10-CM

## 2015-04-18 MED ORDER — MEDROXYPROGESTERONE ACETATE 150 MG/ML IM SUSP
150.0000 mg | Freq: Once | INTRAMUSCULAR | Status: AC
  Administered 2015-04-18: 150 mg via INTRAMUSCULAR

## 2015-06-20 ENCOUNTER — Ambulatory Visit: Payer: Medicaid Other | Attending: Orthopedic Surgery | Admitting: Physical Therapy

## 2015-06-20 ENCOUNTER — Encounter: Payer: Self-pay | Admitting: Physical Therapy

## 2015-06-20 DIAGNOSIS — R29898 Other symptoms and signs involving the musculoskeletal system: Secondary | ICD-10-CM | POA: Diagnosis present

## 2015-06-20 DIAGNOSIS — R262 Difficulty in walking, not elsewhere classified: Secondary | ICD-10-CM | POA: Diagnosis present

## 2015-06-20 DIAGNOSIS — M25562 Pain in left knee: Secondary | ICD-10-CM | POA: Diagnosis present

## 2015-06-20 NOTE — Patient Instructions (Signed)
Quad Set    With other leg bent, foot flat, slowly tighten muscles on thigh of straight leg while counting out loud to __5__. Repeat with other leg. Repeat ___10_ times. Do __2__ sessions per day.  http://gt2.exer.us/276   Copyright  VHI. All rights reserved.  Hip Flexion / Knee Extension: Straight-Leg Raise (Eccentric)    Lie on back. Lift leg with knee straight. Slowly lower leg for 3-5 seconds. _10__ reps per set, _2__ sets per day, _6__ days per week. Lower like elevator, stopping at each floor.   Copyright  VHI. All rights reserved.  Bracing With Heel Slides (Supine)    With neutral spine, tighten pelvic floor and abdominals and hold. Alternating legs, slide heel to bottom. Repeat __12_ times. Do __3_ times a day.   Copyright  VHI. All rights reserved.

## 2015-06-21 NOTE — Therapy (Signed)
[place] MAIN [facility] SERVICES [address] [place], [place], [address] Phone: [phone]   Fax:  [phone]  Physical Therapy Evaluation  Patient Details  Name: Ellen Jackson MRN: [id number] Date of Birth: 1991/06/01 Referring Provider: Stark Jock   Encounter Date: 06/20/2015      PT End of Session - 06/21/15 0734    Visit Number 1   Number of Visits 17   Date for PT Re-Evaluation 08/16/15   PT Start Time 1105   PT Stop Time 1205   PT Time Calculation (min) 60 min   Equipment Utilized During Treatment Gait belt   Activity Tolerance Patient tolerated treatment well   Behavior During Therapy [facility] for tasks assessed/performed      Past Medical History  Diagnosis Date  . SVT (supraventricular tachycardia) ([place])   . Migraine with aura   . Allergy   . Hernia, umbilical   . IBS (irritable bowel syndrome)   . Spina bifida occulta   . Ovarian cyst     Past Surgical History  Procedure Laterality Date  . Knee arthroscopy      X 3  . Cholecystectomy    . Cesarean section  2011  . Appendectomy    . Hernia repair      There were no vitals filed for this visit.  Visit Diagnosis:  Difficulty walking - Plan: PT plan of care cert/re-cert  Left leg weakness - Plan: PT plan of care cert/re-cert  Left knee pain - Plan: PT plan of care cert/re-cert      Subjective Assessment - 06/20/15 1114    Subjective Patient is a pleasant 24 year old female S/P knee arthroscopy to the L LE. She states that he knee has been bothering her since 2001, where she hurt her knee roller skating. She states that she has had 4 surgeries on the L knee including 3 knee "scopes" 1 VMO adjustment. She also reports that she has been doing nearly no exercise of the L LE following surgeries and feels very weak on the L LE.   Patient is accompained by: Family member   Pertinent History Tachycardia, Migranes, multiple knee surgeries, spinabifida,    Limitations  Sitting;Lifting;Standing;Walking;Writing   How long can you sit comfortably? 20-30 minutes    How long can you stand comfortably? 20 minutes at most.    How long can you walk comfortably? 15-20 minutes.    Patient Stated Goals Move aroud more to be able play with children, improve walking to allow return to school, start running.    Currently in Pain? No/denies            [facility] PT Assessment - 06/21/15 0001    Assessment   Medical Diagnosis L knee surgery    Referring Provider Stark Jock    Onset Date/Surgical Date 05/13/15   Hand Dominance Right   Next MD Visit November 2016    Prior Therapy Prior PT for knee pain in 2002. none since them    Precautions   Precautions Knee   Precaution Comments weight bearing as tolerate for first 8 weeks    Required Braces or Orthoses Knee Immobilizer - Left   Balance Screen   Has the patient fallen in the past 6 months No   Has the patient had a decrease in activity level because of a fear of falling?  Yes   Is the patient reluctant to leave their home because of a fear of falling?  Yes   [place] Private residence   Living Arrangements Children;Parent   Available Help at Discharge Family   Type of [place] to enter   Entrance Stairs-Number of Steps 2   Entrance Stairs-Rails None   Home Layout One level   Home Equipment Wheelchair - manual;Tub bench;Crutches   Prior Function   Level of [place] Requirements walk around campus,    Leisure spending time with family, run    Cognition   Overall Cognitive Status Within Functional Limits for tasks assessed   Observation/Other Assessments   Lower Extremity Functional Scale  15/80 ( increased score indicates improved function)    Sensation   Light Touch Impaired by gross assessment   Additional Comments Decreased sensation in the distal L LE compared to the R LE; does not follow  dermatome pattern   Functional Tests   Functional tests Squat   Squat   Comments Decreased weight bearing on the LLE with squats   AROM   Overall AROM Comments L knee extension -44 degrees full extension in sitting . knee flexion 86 degrees. In supine: full knee extension, 93 degrees flexion.      Strength   Overall Strength Comments L LE hip flexion 3-/5, knee flexion 3/5, knee extension 3/5. L hip abduction 4-/5, L hip abduction 4/5. R LE 5/5 for all motions   Palpation   Palpation comment girth at quad 10cm proximal to knee jt line. R 38.6cm L quad 37.8cm    Transfers   Comments Required use of UE to perform transfers.    Ambulation/Gait   Gait Comments ambulates with locking knee brace on the L LE. mild antalgic gait pattern on the L LE, increased hip hike and circumduction on the L for swing through with minimal knee and hip flexion.    Standardized Balance Assessment   Five times sit to stand comments  23 seconds (>12 seconds for age indicates increased fall risk.)    10 Meter Walk .75 m/s ( less than 1.53m/s indicates decreaesed community ambulation and increaed fall risk.)        Treatment:   L LE quad set x 12  L LE Heel slides x 10 with sheet under foot to reduce friction L LE SLR x 10  Squats x 5 with knee brace at 110 degrees flexion   PT provided Moderate verbal and tactile instruction to improve exercise technique including increased quadriceps activation, improved weight bearing and increased ROM as tolerates. Patient demonstrated improved quad activation and increased weight bearing following instruction from PT.                     PT Education - 06/21/15 0733    Education provided Yes   Education Details Plan of care. L LE strengthening exercises.    Person(s) Educated Patient;Parent(s)   Methods Explanation;Demonstration;Tactile cues;Verbal cues   Comprehension Verbalized understanding;Returned demonstration;Verbal cues required;Tactile cues  required             PT Long Term Goals - 06/21/15 0744    PT LONG TERM GOAL #1   Title Patient will be independent with HEP to increase strength and gait to allow return to PLOF. by 08/16/15   Time 8   Period Weeks   Status New   PT LONG TERM GOAL #2   Title Patient will decrease 5 x sit<>stand to < 12 seconds to  indicate improved LE function and decreased fall risk by 08/16/15   Baseline 28 seconds   Time 8   Period Weeks   Status New   PT LONG TERM GOAL #3   Title Patient will improve AROM of knee flexion and extension in sitting to within normal limitis to allow increaed play on the floor with children by 08/16/15   Baseline Extension -44 degrees. Flexion 89 degrees.    Time 8   Period Weeks   Status New   PT LONG TERM GOAL #4   Title Patient will increase knee extension and flexion strength to at least 4+/5 to allow her to negotiate stairs at school by 08/16/15   Baseline Extension and flexion 3/5    Time 8   Period Weeks   Status New   PT LONG TERM GOAL #5   Title Patient will improve Gait speed to >1.0 m/s without knee brace to indicate improved function in the community and decreased fall risk by 08/16/15    Baseline .59m/s   Time 8   Period Weeks   Status New               Plan - 06/21/15 0735    Clinical Impression Statement Patient is a 24 year old female that reports to rehab secondary to L knee arthroscopy. Patient demonstrates significant decrease in L LE strength via manual muscle testing. Gait speed was reduced and found to have significant deviations including antalgic pattern on the L LE, increased hip hike and circumduction, as well as decreased hip and knee flexion with swing through. Functional testing including squats and 5 x STS were also found to be impaired with decreased weight bearing on the L LE and increased time indicating fall risk. Pt was instructed in home exercises to improve knee strength and stability which required moderate verbal  and tactile instructions to perform properly. Based on impairments found at PT evaluation, this patient would benefit from skilled PT in order to increase LE strength, and improve gait, to allow return to PLOF.   Pt will benefit from skilled therapeutic intervention in order to improve on the following deficits Decreased activity tolerance;Decreased balance;Decreased endurance;Decreased knowledge of precautions;Decreased mobility;Decreased range of motion;Decreased safety awareness;Decreased scar mobility;Decreased strength;Difficulty walking;Hypomobility;Increased fascial restricitons;Impaired perceived functional ability;Impaired sensation;Improper body mechanics;Postural dysfunction;Pain   Rehab Potential Good   Clinical Impairments Affecting Rehab Potential Positive: age, motivated to improve.  Negative: smoking, multiple knee surgeries,    PT Frequency 2x / week   PT Duration 8 weeks   PT Treatment/Interventions ADLs/Self Care Home Management;Cryotherapy;Electrical Stimulation;Moist Heat;Gait training;Stair training;Functional mobility training;Therapeutic activities;Therapeutic exercise;Balance training;Neuromuscular re-education;Patient/family education;Manual techniques;Compression bandaging;Scar mobilization;Passive range of motion;Energy conservation   PT Next Visit Plan LEFS, 6 minute walk, LE stabiliation exercises, manual    PT Home Exercise Plan see patient instructions.    Consulted and Agree with Plan of Care Patient;Family member/caregiver   Family Member Consulted mother         Problem List Patient Active Problem List   Diagnosis Date Noted  . Anxiety 02/05/2015  . Headache, migraine 02/05/2015  . Clinical depression 02/05/2015  . Disorder of sulfur-bearing amino acid metabolism ([place]) 02/05/2015  . 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 Paroxysmal supraventricular tachycardia ([place]) 02/05/2015  . LLQ abdominal pain 02/05/2015  . Gonalgia 10/16/2014   Barrie Folk  SPT 06/21/2015   10:48 AM  This entire session was performed under direct supervision and direction of a licensed therapist . I have personally read, edited and approve  of the note as written.  Hopkins,Margaret PT, DPT 06/21/2015, 10:48 AM  [place] MAIN [facility] SERVICES [address] [place], [place], [address] Phone: [phone]   Fax:  [phone]  Name: Ellen Jackson MRN: [id number] Date of Birth: 1991/04/27

## 2015-07-03 ENCOUNTER — Encounter: Payer: Self-pay | Admitting: Physical Therapy

## 2015-07-03 ENCOUNTER — Ambulatory Visit: Payer: Medicaid Other | Attending: Orthopedic Surgery | Admitting: Physical Therapy

## 2015-07-03 DIAGNOSIS — M25562 Pain in left knee: Secondary | ICD-10-CM | POA: Diagnosis present

## 2015-07-03 DIAGNOSIS — R262 Difficulty in walking, not elsewhere classified: Secondary | ICD-10-CM | POA: Diagnosis present

## 2015-07-03 DIAGNOSIS — R29898 Other symptoms and signs involving the musculoskeletal system: Secondary | ICD-10-CM

## 2015-07-03 NOTE — Therapy (Signed)
[place] MAIN [facility] SERVICES [address] [place], [place], [address] Phone: [phone]   Fax:  [phone]  Physical Therapy Treatment  Patient Details  Name: Ellen Jackson MRN: [id number] Date of Birth: [birth year] Referring Provider: Stark Jock   Encounter Date: 07/03/2015      PT End of Session - 07/03/15 0852    Visit Number 2   Number of Visits 17   Date for PT Re-Evaluation 08/16/15   PT Start Time 0800   PT Stop Time 0846   PT Time Calculation (min) 46 min   Equipment Utilized During Treatment Gait belt   Activity Tolerance Patient tolerated treatment well   Behavior During Therapy [facility] for tasks assessed/performed      Past Medical History  Diagnosis Date  . SVT (supraventricular tachycardia) ([place])   . Migraine with aura   . Allergy   . Hernia, umbilical   . IBS (irritable bowel syndrome)   . Spina bifida occulta   . Ovarian cyst     Past Surgical History  Procedure Laterality Date  . Knee arthroscopy      X 3  . Cholecystectomy    . Cesarean section  2011  . Appendectomy    . Hernia repair      There were no vitals filed for this visit.  Visit Diagnosis:  Difficulty walking  Left leg weakness  Left knee pain      Subjective Assessment - 07/03/15 0806    Subjective Patient reports that that she is doing well upon arrival to PT. She states that she has been doing her exercises at least 2 times per day. She states that her leg feels stronger, and that her knee is moving much better since PT evaluation.    Patient is accompained by: Family member   Pertinent History Tachycardia, Migranes, multiple knee surgeries, spinabifida,    Limitations Sitting;Lifting;Standing;Walking;Writing   How long can you sit comfortably? 20-30 minutes    How long can you stand comfortably? 20 minutes at most.    How long can you walk comfortably? 15-20 minutes.    Patient Stated Goals Move aroud more to be able play with  children, improve walking to allow return to school, start running.    Currently in Pain? No/denies           supine therex:  Quad set 2x 10 L LE  SLR 2x 10 L LE Bridges 2x 12  Hip abduction red tband  2x 10 BLE   Seated therex Long arc qaud set 2x 10 L LE Hip abduction  Red tband 2x 10 BLE  Sit to stand 2x 10  Marches red tband 2x 10 BLE  PF/DF R LE red tband 2x 12   Standing Terminal knee extension yellow tband  2 x 10 L LE  PT provided moderate verbal instruction for proper LE positioning, decreaes compensation of the hip and trunk, improved speed of movement, increased weight bearing on the L LE with terminal knee extension and sit to stand. Patient responded very well to instruction from PT and was able to perform all exercises with minimal compensation.                          PT Long Term Goals - 06/21/15 0744    PT LONG TERM GOAL #1   Title Patient will be independent with HEP to increase strength and gait to allow return to PLOF.  by 08/16/15   Time 8   Period Weeks   Status New   PT LONG TERM GOAL #2   Title Patient will decrease 5 x sit<>stand to < 12 seconds to indicate improved LE function and decreased fall risk by 08/16/15   Baseline 28 seconds   Time 8   Period Weeks   Status New   PT LONG TERM GOAL #3   Title Patient will improve AROM of knee flexion and extension in sitting to within normal limitis to allow increaed play on the floor with children by 08/16/15   Baseline Extension -44 degrees. Flexion 89 degrees.    Time 8   Period Weeks   Status New   PT LONG TERM GOAL #4   Title Patient will increase knee extension and flexion strength to at least 4+/5 to allow her to negotiate stairs at school by 08/16/15   Baseline Extension and flexion 3/5    Time 8   Period Weeks   Status New   PT LONG TERM GOAL #5   Title Patient will improve Gait speed to >1.0 m/s without knee brace to indicate improved function in the community and  decreased fall risk by 08/16/15    Baseline .24m/s   Time 8   Period Weeks   Status New               Plan - 07/03/15 0933    Clinical Impression Statement Patient educated in continued plan of care due to limited number of visits. PT instructed in LE strengthening and knee stabilization exercises. PT was required to provide min-moderate verbal and tactile instruction for proper exercise positioning, proper speed of movement and decreased compensation from the hip with LE knee and ankle strengthening. Patient responded very well to instruction with improved exercise technique. AROM was assessed on this day and was noted to improve with flexion and extension. Gait mechanics were noted to also have improved with only mild antalgic pattern noted. Continued skilled PT is recommended to improve LE strength and improve gait to allow return to PLOF.   Pt will benefit from skilled therapeutic intervention in order to improve on the following deficits Decreased activity tolerance;Decreased balance;Decreased endurance;Decreased knowledge of precautions;Decreased mobility;Decreased range of motion;Decreased safety awareness;Decreased scar mobility;Decreased strength;Difficulty walking;Hypomobility;Increased fascial restricitons;Impaired perceived functional ability;Impaired sensation;Improper body mechanics;Postural dysfunction;Pain   Rehab Potential Good   Clinical Impairments Affecting Rehab Potential Positive: age, motivated to improve.  Negative: smoking, multiple knee surgeries,    PT Frequency 2x / week   PT Duration 8 weeks   PT Treatment/Interventions ADLs/Self Care Home Management;Cryotherapy;Electrical Stimulation;Moist Heat;Gait training;Stair training;Functional mobility training;Therapeutic activities;Therapeutic exercise;Balance training;Neuromuscular re-education;Patient/family education;Manual techniques;Compression bandaging;Scar mobilization;Passive range of motion;Energy conservation    PT Next Visit Plan LEFS, 6 minute walk, LE stabiliation exercises, manual    PT Home Exercise Plan see patient instructions.    Consulted and Agree with Plan of Care Patient;Family member/caregiver   Family Member Consulted mother        Problem List Patient Active Problem List   Diagnosis Date Noted  . Anxiety 02/05/2015  . Headache, migraine 02/05/2015  . Clinical depression 02/05/2015  . 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 Paroxysmal supraventricular tachycardia ([place]) 02/05/2015  . LLQ abdominal pain 02/05/2015  . Gonalgia 10/16/2014   Barrie Folk SPT 07/04/2015   10:55 AM  This entire session was performed under direct supervision and direction of a licensed therapist . I have personally read,  edited and approve of the note as written.  Hopkins,Margaret PT, DPT 07/04/2015, 10:55 AM  [place] MAIN [facility] SERVICES [address] [place], [place], [address] Phone: [phone]   Fax:  [phone]  Name: Ellen Jackson MRN: [id number] Date of Birth: 1991-07-20

## 2015-07-03 NOTE — Patient Instructions (Signed)
KNEE: Extension, Long Arc Quads - Sitting    Raise leg until knee is straight. _10__ reps per set, _2__ sets per day, _5__ days per week  Copyright  VHI. All rights reserved.  ANKLE: Plantarflexion - Sitting (Band)    Place band around foot; hold other end. Sit at edge of sitting surface. Keep heel in place. Push foot down against band. Hold __2_ seconds. Use __red______ band. _15__ reps per set, 2_ sets per day, _5__ days per week  Copyright  VHI. All rights reserved.  ANKLE: Dorsiflexion (Band)    Sit at edge of surface. Place band around top of foot. Keeping heel on floor, raise toes of banded foot. Hold _2__ seconds. Use ____red____ band. __15_ reps per set, _2__ sets per day, __5_ days per week  Copyright  VHI. All rights reserved.  Bridge    Lie back, legs bent. Inhale, pressing hips up. Keeping ribs in, lengthen lower back. Exhale, rolling down along spine from top. Repeat __12__ times. Do _2___ sessions per day.  http://pm.exer.us/55   Copyright  VHI. All rights reserved.  Quad Strength: Terminal Knee Extension With Tubing    With tubing behind involved knee or just above, and foot out at 45, bend knee to 30. Use thigh muscles to straighten knee. Repeat __10-12__ times Do _2___ sessions per day.  http://cc.exer.us/22   Copyright  VHI. All rights reserved.  Functional Quadriceps: Sit to Stand    Sit on edge of chair, feet flat on floor. Stand upright, extending knees fully. Repeat __10-12__ times per set. Do ___2_ sets per session. Do _2___ sessions per day.  http://orth.exer.us/735   Copyright  VHI. All rights reserved.

## 2015-07-04 ENCOUNTER — Ambulatory Visit (INDEPENDENT_AMBULATORY_CARE_PROVIDER_SITE_OTHER): Payer: Medicaid Other

## 2015-07-04 DIAGNOSIS — Z3042 Encounter for surveillance of injectable contraceptive: Secondary | ICD-10-CM | POA: Diagnosis not present

## 2015-07-04 MED ORDER — MEDROXYPROGESTERONE ACETATE 150 MG/ML IM SUSP: 150.0000 mg | INTRAMUSCULAR | Status: DC

## 2015-07-04 MED ORDER — MEDROXYPROGESTERONE ACETATE 150 MG/ML IM SUSP
150.0000 mg | Freq: Once | INTRAMUSCULAR | Status: AC
  Administered 2015-07-04: 150 mg via INTRAMUSCULAR

## 2015-07-11 ENCOUNTER — Ambulatory Visit: Payer: Medicaid Other | Admitting: Physical Therapy

## 2015-07-11 ENCOUNTER — Encounter: Payer: Self-pay | Admitting: Physical Therapy

## 2015-07-11 DIAGNOSIS — R262 Difficulty in walking, not elsewhere classified: Secondary | ICD-10-CM | POA: Diagnosis not present

## 2015-07-11 DIAGNOSIS — M25562 Pain in left knee: Secondary | ICD-10-CM

## 2015-07-11 DIAGNOSIS — R29898 Other symptoms and signs involving the musculoskeletal system: Secondary | ICD-10-CM

## 2015-07-11 NOTE — Therapy (Signed)
[place] MAIN [facility] SERVICES [address] [place], [place], [address] Phone: [phone]   Fax:  [phone]  Physical Therapy Treatment  Patient Details  Name: Ellen Jackson MRN: [id number] Date of Birth: [birth year] Referring Provider: Stark Jock   Encounter Date: 07/11/2015      PT End of Session - 07/11/15 1510    Visit Number 3   Number of Visits 17   Date for PT Re-Evaluation 08/16/15   PT Start Time 1030   PT Stop Time 1115   PT Time Calculation (min) 45 min   Equipment Utilized During Treatment Gait belt   Activity Tolerance Patient tolerated treatment well   Behavior During Therapy [facility] for tasks assessed/performed      Past Medical History  Diagnosis Date  . SVT (supraventricular tachycardia) ([place])   . Migraine with aura   . Allergy   . Hernia, umbilical   . IBS (irritable bowel syndrome)   . Spina bifida occulta   . Ovarian cyst     Past Surgical History  Procedure Laterality Date  . Knee arthroscopy      X 3  . Cholecystectomy    . Cesarean section  2011  . Appendectomy    . Hernia repair      There were no vitals filed for this visit.  Visit Diagnosis:  Difficulty walking  Left leg weakness  Left knee pain      Subjective Assessment - 07/11/15 1034    Subjective Patient reports that she is in a little pain upon arrival to PT. She reports that she has some pain in the lateral knee that radiates down the side of the distal LE. As well as pain in the posterior aspect of the knee. She reports that she fully discontinued use of brace last Saturday.    Patient is accompained by: Family member   Pertinent History Tachycardia, Migranes, multiple knee surgeries, spinabifida,    Limitations Sitting;Lifting;Standing;Walking;Writing   How long can you sit comfortably? 20-30 minutes    How long can you stand comfortably? 20 minutes at most.    How long can you walk comfortably? 15-20 minutes.    Patient  Stated Goals Move aroud more to be able play with children, improve walking to allow return to school, start running.    Currently in Pain? Yes   Pain Score 6    Pain Location Knee   Pain Orientation Right   Pain Descriptors / Indicators Shooting;Aching;Dull   Pain Type Acute pain   Pain Onset In the past 7 days   Pain Frequency Intermittent            Manual therapy  STM with trigger point release with "the stick" x 5 minutes  AROM assessed LLE Knee:  flexion in prone 125 degrees,  Extension in sitting -4 degrees.  Patient reports that she has decreased pain in the knee at insertion of HS and gastroc following manual therapy.   Standing Calf stretch at stairs 2 x 20 seconds BLE Standing Hamstring stretch 2x 20 seconds BLE Prone quad stretch 2x 20 seconds with theraband pull. LLE  Quantum leg press 30# 2x 10 L LE Supine L LE SLR 2x 10  Sitting long arc quad 2x 10 LLE Standing terminal knee extension x 10 with red tband, x 8 green tband LLE Side lunge x 7 L LE.  Step ups to 5 inch step x 8 LLE Mini squat x 8   Cues for  improved posterior chain movement with squat, and lunge, cues also provided for improved positioning and speed of movement. Patient responded very well to instruction and was able to demonstrate increased control of the knee                           PT Long Term Goals - 06/21/15 0744    PT LONG TERM GOAL #1   Title Patient will be independent with HEP to increase strength and gait to allow return to PLOF. by 08/16/15   Time 8   Period Weeks   Status New   PT LONG TERM GOAL #2   Title Patient will decrease 5 x sit<>stand to < 12 seconds to indicate improved LE function and decreased fall risk by 08/16/15   Baseline 28 seconds   Time 8   Period Weeks   Status New   PT LONG TERM GOAL #3   Title Patient will improve AROM of knee flexion and extension in sitting to within normal limitis to allow increaed play on the floor with  children by 08/16/15   Baseline Extension -44 degrees. Flexion 89 degrees.    Time 8   Period Weeks   Status New   PT LONG TERM GOAL #4   Title Patient will increase knee extension and flexion strength to at least 4+/5 to allow her to negotiate stairs at school by 08/16/15   Baseline Extension and flexion 3/5    Time 8   Period Weeks   Status New   PT LONG TERM GOAL #5   Title Patient will improve Gait speed to >1.0 m/s without knee brace to indicate improved function in the community and decreased fall risk by 08/16/15    Baseline .67m/s   Time 8   Period Weeks   Status New               Plan - 07/11/15 1510    Clinical Impression Statement Patient instructed in LE strengthening/stabilization exercises. Pt was able to demonstrate improved gait pattern with normalized stance time on BLE. Patient was able to demonstrate proper exercise technique for advanced LE strengthening follow cues from PT to increase hip and knee control as well as increase positioning to reduce stress on knee. Continued skilled PT is recommended to improve Strength, knee stability, and gait to allow return to PLOF.   Pt will benefit from skilled therapeutic intervention in order to improve on the following deficits Decreased activity tolerance;Decreased balance;Decreased endurance;Decreased knowledge of precautions;Decreased mobility;Decreased range of motion;Decreased safety awareness;Decreased scar mobility;Decreased strength;Difficulty walking;Hypomobility;Increased fascial restricitons;Impaired perceived functional ability;Impaired sensation;Improper body mechanics;Postural dysfunction;Pain   Rehab Potential Good   Clinical Impairments Affecting Rehab Potential Positive: age, motivated to improve.  Negative: smoking, multiple knee surgeries,    PT Frequency 2x / week   PT Duration 8 weeks   PT Treatment/Interventions ADLs/Self Care Home Management;Cryotherapy;Electrical Stimulation;Moist Heat;Gait  training;Stair training;Functional mobility training;Therapeutic activities;Therapeutic exercise;Balance training;Neuromuscular re-education;Patient/family education;Manual techniques;Compression bandaging;Scar mobilization;Passive range of motion;Energy conservation   PT Next Visit Plan Check GOALS. [place] [facility]?   PT Home Exercise Plan see patient instructions.    Consulted and Agree with Plan of Care Patient;Family member/caregiver   Family Member Consulted mother        Problem List Patient Active Problem List   Diagnosis Date Noted  . Anxiety 02/05/2015  . Headache, migraine 02/05/2015  . Clinical depression 02/05/2015  . 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 Paroxysmal supraventricular tachycardia ([place]) 02/05/2015  . LLQ abdominal pain 02/05/2015  . Gonalgia 10/16/2014   Barrie Folk SPT 07/11/2015   6:17 PM  This entire session was performed under direct supervision and direction of a licensed therapist/therapist assistant . I have personally read, edited and approve of the note as written.  Hopkins,Margaret PT, DPT 07/11/2015, 6:17 PM  [place] MAIN [facility] SERVICES [address] [place], [place], [address] Phone: [phone]   Fax:  [phone]  Name: Ellen Jackson MRN: [id number] Date of Birth: 02/16/1991

## 2015-07-23 ENCOUNTER — Ambulatory Visit: Payer: Medicaid Other | Admitting: Physical Therapy

## 2015-07-24 ENCOUNTER — Ambulatory Visit: Payer: Medicaid Other | Admitting: Physical Therapy

## 2015-07-30 ENCOUNTER — Ambulatory Visit: Payer: Medicaid Other | Attending: Orthopedic Surgery | Admitting: Physical Therapy

## 2015-07-30 ENCOUNTER — Encounter: Payer: Self-pay | Admitting: Physical Therapy

## 2015-07-30 DIAGNOSIS — R29898 Other symptoms and signs involving the musculoskeletal system: Secondary | ICD-10-CM | POA: Diagnosis present

## 2015-07-30 DIAGNOSIS — R262 Difficulty in walking, not elsewhere classified: Secondary | ICD-10-CM | POA: Diagnosis not present

## 2015-07-30 DIAGNOSIS — M25562 Pain in left knee: Secondary | ICD-10-CM

## 2015-07-31 NOTE — Therapy (Signed)
[place] MAIN [facility] SERVICES [address] [place], [place], [address] Phone: [phone]   Fax:  [phone]  Physical Therapy Treatment/Discharge summary  Patient Details  Name: Ellen Jackson MRN: [id number] Date of Birth: 18-Apr-1991 Referring Provider: Stark Jock   Encounter Date: 07/30/2015      PT End of Session - 07/31/15 1116    Visit Number 4   Number of Visits 17   Date for PT Re-Evaluation 08/16/15   PT Start Time 1110   PT Stop Time 1155   PT Time Calculation (min) 45 min   Equipment Utilized During Treatment Gait belt   Activity Tolerance Patient tolerated treatment well   Behavior During Therapy [facility] for tasks assessed/performed      Past Medical History  Diagnosis Date  . SVT (supraventricular tachycardia) ([place])   . Migraine with aura   . Allergy   . Hernia, umbilical   . IBS (irritable bowel syndrome)   . Spina bifida occulta   . Ovarian cyst     Past Surgical History  Procedure Laterality Date  . Knee arthroscopy      X 3  . Cholecystectomy    . Cesarean section  2011  . Appendectomy    . Hernia repair      There were no vitals filed for this visit.  Visit Diagnosis:  Difficulty walking  Left leg weakness  Left knee pain      Subjective Assessment - 07/30/15 1115    Subjective Patient reports that her leg is getting a little better. She feels stronger and can see an improvement in her walking. Patient reports compliance with HEP; Worst pain of 5/10 in left knee   Patient is accompained by: Family member   Pertinent History Tachycardia, Migranes, multiple knee surgeries, spinabifida,    Limitations Sitting;Lifting;Standing;Walking;Writing   How long can you sit comfortably? 20-30 minutes    How long can you stand comfortably? 20 minutes at most.    How long can you walk comfortably? 15-20 minutes.    Patient Stated Goals Move aroud more to be able play with children, improve walking to allow  return to school, start running.    Currently in Pain? No/denies   Pain Onset In the past 7 days            [facility] PT Assessment - 07/31/15 0001    Observation/Other Assessments   Lower Extremity Functional Scale  40/80 The higher the score the less disability; impoved   as compared to initial eval on 06/20/15 which was 15/80   AROM   Overall AROM Comments in supine: LLE: knee extension 0 degrees, knee flexion 136 degrees; RLE: knee extension 0 degrees, knee flexion 142 degrees;    Strength   Overall Strength Comments RLE 5/5; LLE: hip grossly 4/5, knee: ext 4/5, flex 4-/5, ankle 4/5   Standardized Balance Assessment   Five times sit to stand comments  17 sec without HHA (>10 sec indicates increased fall risk); improved from initial eval on 06/20/15 which was 23 sec;   10 Meter Walk 1.25 m/s without AD; community ambulator; improved from initial eval on 06/20/15 which was0.75 m/s       Warm up on Nustep level 2 BUE/BLE x4 min (Unbilled);  Instructed patient in LEFs, 5 times sit<>Stand and 10 meter walk etc to address progress towards goals; Please see above. She required min VCs for correct activity technique;  Also assessed knee AROM and strength, see above.  Patient exhibits increased discomfort along left medial hamstring tendon. Educated patient and caregiver on cross friction massage and importance of ice/rest/elevation to reduce discomfort. PT concerned that pain could be attributed to inflammation from overuse. Educated patient on safety of LE strengthening exercise and importance of hamstring stretches to reduce tightness.  Also educated patient on ways to progress LE strengthening within community such as using a Physiological scientist at the gym and safe gym equipment to use.                  PT Education - 07/31/15 1115    Education provided Yes   Education Details progress towards goals, plan of care, ways to increase HEP advancement   Person(s) Educated Patient    Methods Explanation;Verbal cues   Comprehension Verbalized understanding;Returned demonstration;Verbal cues required             PT Long Term Goals - 07/30/15 1124    PT LONG TERM GOAL #1   Title Patient will be independent with HEP to increase strength and gait to allow return to PLOF. by 08/16/15   Baseline initiated HEP, still advancing   Time 8   Period Weeks   Status On-going   PT LONG TERM GOAL #2   Title Patient will decrease 5 x sit<>stand to < 12 seconds to indicate improved LE function and decreased fall risk by 08/16/15   Baseline 17 seconds improved from 23 sec at evaluation   Time 8   Period Weeks   Status Partially Met   PT LONG TERM GOAL #3   Title Patient will improve AROM of knee flexion and extension in sitting to within normal limitis to allow increaed play on the floor with children by 08/16/15   Baseline extension 0 degrees, flexion 136 degrees   Time 8   Period Weeks   Status Achieved   PT LONG TERM GOAL #4   Title Patient will increase knee extension and flexion strength to at least 4+/5 to allow her to negotiate stairs at school by 08/16/15   Baseline RLE 5/5; LLE: hip grossly 4/5, knee: ext 4/5, flex 4-/5, ankle 4/5   Time 8   Period Weeks   Status Partially Met   PT LONG TERM GOAL #5   Title Patient will improve Gait speed to >1.0 m/s without knee brace to indicate improved function in the community and decreased fall risk by 08/16/15    Baseline 1.25 m/s   Time 8   Period Weeks   Status Achieved               Plan - 07/31/15 1116    Clinical Impression Statement Patient instructed in outcome measures to assess progress towards goals. She demonstrates significant improvement in LLE knee AROM. Patient also demonstrates improved LE strength although still has weakness in knee flexors. Patient is able to ambulate at community ambulator speed without AD demonstrating good gait safety. She also reports improved functional mobility as evidenced  on LEFs. Patient has made progress towards all goals but has not quite met all of them. However she has exhausted all insurance visits. PT recommends discharge from therapy at this time as patient is unable to self pay for additional therapy. Patient agreeable.   Pt will benefit from skilled therapeutic intervention in order to improve on the following deficits Decreased activity tolerance;Decreased balance;Decreased endurance;Decreased knowledge of precautions;Decreased mobility;Decreased range of motion;Decreased safety awareness;Decreased scar mobility;Decreased strength;Difficulty walking;Hypomobility;Increased fascial restricitons;Impaired perceived functional ability;Impaired sensation;Improper body mechanics;Postural dysfunction;Pain  Rehab Potential Good   Clinical Impairments Affecting Rehab Potential Positive: age, motivated to improve.  Negative: smoking, multiple knee surgeries,    PT Frequency 2x / week   PT Duration 8 weeks   PT Treatment/Interventions ADLs/Self Care Home Management;Cryotherapy;Electrical Stimulation;Moist Heat;Gait training;Stair training;Functional mobility training;Therapeutic activities;Therapeutic exercise;Balance training;Neuromuscular re-education;Patient/family education;Manual techniques;Compression bandaging;Scar mobilization;Passive range of motion;Energy conservation   PT Next Visit Plan discharge from PT at this time   PT Home Exercise Plan continue as previously given, educated patient on safe ways to exercise at local gym   Consulted and Agree with Plan of Care Patient;Family member/caregiver   Family Member Consulted mother        Problem List Patient Active Problem List   Diagnosis Date Noted  . Anxiety 02/05/2015  . Headache, migraine 02/05/2015  . Clinical depression 02/05/2015  . 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 Paroxysmal supraventricular tachycardia ([place]) 02/05/2015  . LLQ  abdominal pain 02/05/2015  . Gonalgia 10/16/2014    Cailen Mihalik PT, DPT 07/31/2015, 11:19 AM  [place] MAIN [facility] SERVICES [address] [place], [place], [address] Phone: [phone]   Fax:  [phone]  Name: Ellen Jackson MRN: [id number] Date of Birth: 07-05-1991

## 2015-08-20 ENCOUNTER — Emergency Department
Admission: EM | Admit: 2015-08-20 | Discharge: 2015-08-20 | Disposition: A | Discharge status: Home / Self Care | Payer: Medicaid Other | Attending: Emergency Medicine | Admitting: Emergency Medicine

## 2015-08-20 ENCOUNTER — Emergency Department: Payer: Medicaid Other

## 2015-08-20 ENCOUNTER — Encounter: Payer: Self-pay | Admitting: Emergency Medicine

## 2015-08-20 DIAGNOSIS — F17200 Nicotine dependence, unspecified, uncomplicated: Secondary | ICD-10-CM | POA: Insufficient documentation

## 2015-08-20 DIAGNOSIS — Y9301 Activity, walking, marching and hiking: Secondary | ICD-10-CM | POA: Diagnosis not present

## 2015-08-20 DIAGNOSIS — S93401A Sprain of unspecified ligament of right ankle, initial encounter: Secondary | ICD-10-CM | POA: Insufficient documentation

## 2015-08-20 DIAGNOSIS — Y92830 Public park as the place of occurrence of the external cause: Secondary | ICD-10-CM | POA: Diagnosis not present

## 2015-08-20 DIAGNOSIS — Y998 Other external cause status: Secondary | ICD-10-CM | POA: Diagnosis not present

## 2015-08-20 DIAGNOSIS — Z880 Allergy status to penicillin: Secondary | ICD-10-CM | POA: Diagnosis not present

## 2015-08-20 DIAGNOSIS — S9031XA Contusion of right foot, initial encounter: Secondary | ICD-10-CM | POA: Diagnosis not present

## 2015-08-20 DIAGNOSIS — Z91040 Latex allergy status: Secondary | ICD-10-CM | POA: Insufficient documentation

## 2015-08-20 DIAGNOSIS — S99911A Unspecified injury of right ankle, initial encounter: Secondary | ICD-10-CM | POA: Diagnosis present

## 2015-08-20 DIAGNOSIS — X58XXXA Exposure to other specified factors, initial encounter: Secondary | ICD-10-CM | POA: Diagnosis not present

## 2015-08-20 DIAGNOSIS — Z79899 Other long term (current) drug therapy: Secondary | ICD-10-CM | POA: Insufficient documentation

## 2015-08-20 MED ORDER — TRAMADOL HCL 50 MG PO TABS: 50.0000 mg | ORAL_TABLET | Freq: Two times a day (BID) | ORAL | Status: DC

## 2015-08-20 NOTE — ED Notes (Signed)
Pt discharged home after verbalizing understanding of discharge instructions; nad noted. 

## 2015-08-20 NOTE — ED Notes (Signed)
Pt reports that she rolled her ankle while on a visit to a park on Thursday. Pt was seen at [facility] and referred to her pcp for follow up but can't get an appointment until 29th. Pt states that pain is getting worse and that she is having pressure and tingling in the foot. Pt alert & oriented with NAD noted.

## 2015-08-20 NOTE — Discharge Instructions (Signed)
Ankle Sprain °An ankle sprain is an injury to the strong, fibrous tissues (ligaments) that hold the bones of your ankle joint together.  °CAUSES °An ankle sprain is usually caused by a fall or by twisting your ankle. Ankle sprains most commonly occur when you step on the outer edge of your foot, and your ankle turns inward. People who participate in sports are more prone to these types of injuries.  °SYMPTOMS  °· Pain in your ankle. The pain may be present at rest or only when you are trying to stand or walk. °· Swelling. °· Bruising. Bruising may develop immediately or within 1 to 2 days after your injury. °· Difficulty standing or walking, particularly when turning corners or changing directions. °DIAGNOSIS  °Your caregiver will ask you details about your injury and perform a physical exam of your ankle to determine if you have an ankle sprain. During the physical exam, your caregiver will press on and apply pressure to specific areas of your foot and ankle. Your caregiver will try to move your ankle in certain ways. An X-ray exam may be done to be sure a bone was not broken or a ligament did not separate from one of the bones in your ankle (avulsion fracture).  °TREATMENT  °Certain types of braces can help stabilize your ankle. Your caregiver can make a recommendation for this. Your caregiver may recommend the use of medicine for pain. If your sprain is severe, your caregiver may refer you to a surgeon who helps to restore function to parts of your skeletal system (orthopedist) or a physical therapist. °HOME CARE INSTRUCTIONS  °· Apply ice to your injury for 1-2 days or as directed by your caregiver. Applying ice helps to reduce inflammation and pain. °· Put ice in a plastic bag. °· Place a towel between your skin and the bag. °· Leave the ice on for 15-20 minutes at a time, every 2 hours while you are awake. °· Only take over-the-counter or prescription medicines for pain, discomfort, or fever as directed by  your caregiver. °· Elevate your injured ankle above the level of your heart as much as possible for 2-3 days. °· If your caregiver recommends crutches, use them as instructed. Gradually put weight on the affected ankle. Continue to use crutches or a cane until you can walk without feeling pain in your ankle. °· If you have a plaster splint, wear the splint as directed by your caregiver. Do not rest it on anything harder than a pillow for the first 24 hours. Do not put weight on it. Do not get it wet. You may take it off to take a shower or bath. °· You may have been given an elastic bandage to wear around your ankle to provide support. If the elastic bandage is too tight (you have numbness or tingling in your foot or your foot becomes cold and blue), adjust the bandage to make it comfortable. °· If you have an air splint, you may blow more air into it or let air out to make it more comfortable. You may take your splint off at night and before taking a shower or bath. Wiggle your toes in the splint several times per day to decrease swelling. °SEEK MEDICAL CARE IF:  °· You have rapidly increasing bruising or swelling. °· Your toes feel extremely cold or you lose feeling in your foot. °· Your pain is not relieved with medicine. °SEEK IMMEDIATE MEDICAL CARE IF: °· Your toes are numb or blue. °·   You have severe pain that is increasing. MAKE SURE YOU:   Understand these instructions.  Will watch your condition.  Will get help right away if you are not doing well or get worse.   This information is not intended to replace advice given to you by your health care provider. Make sure you discuss any questions you have with your health care provider.   Document Released: 08/10/2005 Document Revised: 08/31/2014 Document Reviewed: 08/22/2011 Elsevier Interactive Patient Education 2016 [employer]  Generic Ankle Exercises EXERCISES RANGE OF MOTION (ROM) AND STRETCHING EXERCISES These exercises may help you  when beginning to rehabilitate your injury. Your symptoms may resolve with or without further involvement from your physician, physical therapist or athletic trainer. While completing these exercises, remember:   Restoring tissue flexibility helps normal motion to return to the joints. This allows healthier, less painful movement and activity.  An effective stretch should be held for at least 30 seconds.  A stretch should never be painful. You should only feel a gentle lengthening or release in the stretched tissue. RANGE OF MOTION - Dorsi/Plantar Flexion  While sitting with your right / left knee straight, draw the top of your foot upwards by flexing your ankle. Then reverse the motion, pointing your toes downward.  Hold each position for __________ seconds.  After completing your first set of exercises, repeat this exercise with your knee bent. Repeat __________ times. Complete this exercise __________ times per day.  RANGE OF MOTION - Ankle Alphabet  Imagine your right / left big toe is a pen.  Keeping your hip and knee still, write out the entire alphabet with your "pen." Make the letters as large as you can without increasing any discomfort. Repeat __________ times. Complete this exercise __________ times per day.  RANGE OF MOTION - Ankle Dorsiflexion, Active Assisted   Remove shoes and sit on a chair that is preferably not on a carpeted surface.  Place right / left foot under knee. Extend your opposite leg for support.  Keeping your heel down, slide your right / left foot back toward the chair until you feel a stretch at your ankle or calf. If you do not feel a stretch, slide your bottom forward to the edge of the chair while still keeping your heel down.  Hold this stretch for __________ seconds. Repeat __________ times. Complete this stretch __________ times per day.  STRENGTHENING EXERCISES  These exercises may help you when beginning to rehabilitate your injury. They may  resolve your symptoms with or without further involvement from your physician, physical therapist or athletic trainer. While completing these exercises, remember:   Muscles can gain both the endurance and the strength needed for everyday activities through controlled exercises.  Complete these exercises as instructed by your physician, physical therapist or athletic trainer. Progress the resistance and repetitions only as guided.  You may experience muscle soreness or fatigue, but the pain or discomfort you are trying to eliminate should never worsen during these exercises. If this pain does worsen, stop and make certain you are following the directions exactly. If the pain is still present after adjustments, discontinue the exercise until you can discuss the trouble with your clinician. STRENGTH - Dorsiflexors  Secure a rubber exercise band/tubing to a fixed object (table, pole) and loop the other end around your right / left foot.  Sit on the floor facing the fixed object. The band/tubing should be slightly tense when your foot is relaxed.  Slowly draw your foot back  toward you using your ankle and toes.  Hold this position for __________ seconds. Slowly release the tension in the band and return your foot to the starting position. Repeat __________ times. Complete this exercise __________ times per day.  STRENGTH - Plantar-flexors  Sit with your right / left leg extended. Holding onto both ends of a rubber exercise band/tubing, loop it around the ball of your foot. Keep a slight tension in the band.  Slowly push your toes away from you, pointing them downward.  Hold this position for __________ seconds. Return slowly, controlling the tension in the band/tubing. Repeat __________ times. Complete this exercise __________ times per day.  STRENGTH - Ankle Eversion  Secure one end of a rubber exercise band/tubing to a fixed object (table, pole). Loop the other end around your foot just  before your toes.  Place your fists between your knees. This will focus your strengthening at your ankle.  Drawing the band/tubing across your opposite foot, slowly, pull your little toe out and up. Make sure the band/tubing is positioned to resist the entire motion.  Hold this position for __________ seconds.  Have your muscles resist the band/tubing as it slowly pulls your foot back to the starting position. Repeat __________ times. Complete this exercise __________ times per day.  STRENGTH - Ankle Inversion  Secure one end of a rubber exercise band/tubing to a fixed object (table, pole). Loop the other end around your foot just before your toes.  Place your fists between your knees. This will focus your strengthening at your ankle.  Slowly, pull your big toe up and in, making sure the band/tubing is positioned to resist the entire motion.  Hold this position for __________ seconds.  Have your muscles resist the band/tubing as it slowly pulls your foot back to the starting position. Repeat __________ times. Complete this exercises __________ times per day.  STRENGTH - Towel Curls  Sit in a chair positioned on a non-carpeted surface.  Place your foot on a towel, keeping your heel on the floor.  Pull the towel toward your heel by only curling your toes. Keep your heel on the floor. If instructed by your physician, physical therapist or athletic trainer, add weight to the end of the towel. Repeat __________ times. Complete this exercise __________ times per day. STRENGTH - Plantar-flexors, Standing  Stand with your feet shoulder width apart. Steady yourself with a wall or table using as little support as needed.  Keeping your weight evenly spread over the width of your feet, rise up on your toes.*  Hold this position for __________ seconds. Repeat __________ times. Complete this exercise __________ times per day.  *If this is too easy, shift your weight toward your right / left  leg until you feel challenged. Ultimately, you may be asked to do this exercise with your right / left foot only. BALANCE - Tandem Walking  Place your uninjured foot on a line 2-4 inches wide and at least 10 feet long.  Keeping your balance without using anything for extra support, place your right / left heel directly in front of your other foot.  Slowly raise your back foot up, lifting from the heel to the toes, and place it directly in front of the right / left foot.  Continue to walk along the line slowly. Walk for ____________________ feet. Repeat ____________________ times. Complete ____________________ times per day.   This information is not intended to replace advice given to you by your health care provider. Make  sure you discuss any questions you have with your health care provider.   Document Released: 06/24/2005 Document Revised: 08/31/2014 Document Reviewed: 11/22/2008 Elsevier Interactive Patient Education 2016 [place] with your provider or Dr. Marry Guan as needed for ongoing symptoms.

## 2015-08-20 NOTE — ED Provider Notes (Signed)
[facility] Emergency Department Provider Note ____________________________________________  Time seen: 67  I have reviewed the triage vital signs and the nursing notes.  HISTORY  Chief Complaint  Ankle Pain  HPI Ellen Jackson is a 24 y.o. female reports to the ED for evaluation of continued pain and swelling to the right ankle. She describes a rolling mechanism to the right ankle on Thursday, days prior to arrival while walking through a park. She was evaluated and treated at the [facility] in [place] with an x-ray, Velcro splint, and crutches.She describes that the ED provider is unclear whether she had a fracture to the ankle or not. She was advised to follow-up with her primary care provider, but advised that she was unable to do so until next month.   Past Medical History  Diagnosis Date  . SVT (supraventricular tachycardia) ([place])   . Migraine with aura   . Allergy   . Hernia, umbilical   . IBS (irritable bowel syndrome)   . Spina bifida occulta   . Ovarian cyst     Patient Active Problem List   Diagnosis Date Noted  . Anxiety 02/05/2015  . Headache, migraine 02/05/2015  . Clinical depression 02/05/2015  . 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 Paroxysmal supraventricular tachycardia ([place]) 02/05/2015  . LLQ abdominal pain 02/05/2015  . Gonalgia 10/16/2014    Past Surgical History  Procedure Laterality Date  . Knee arthroscopy      X 3  . Cholecystectomy    . Cesarean section  2011  . Appendectomy    . Hernia repair      Current Outpatient Rx  Name  Route  Sig  Dispense  Refill  . citalopram (CELEXA) 20 MG tablet               . ibuprofen (ADVIL,MOTRIN) 600 MG tablet      TK 1 T PO Q 6 H PRN P      0   . medroxyPROGESTERone (DEPO-PROVERA) 150 MG/ML injection   Intramuscular   Inject 150 mg into the muscle every 3 (three) months.         . metoprolol  succinate (TOPROL-XL) 25 MG 24 hr tablet   Oral   Take by mouth.         Marland Kitchen omeprazole (PRILOSEC) 40 MG capsule   Oral   Take 40 mg by mouth.         . oxyCODONE-acetaminophen (ROXICET) 5-325 MG per tablet   Oral   Take 1 tablet by mouth every 8 (eight) hours as needed for severe pain. Patient not taking: Reported on 06/20/2015   20 tablet   0   . traMADol (ULTRAM) 50 MG tablet   Oral   Take 1 tablet (50 mg total) by mouth 2 (two) times daily.   10 tablet   0    Allergies Codeine; Morphine; Nalbuphine; Ondansetron hcl; Other; Penicillins; Latex; Metoclopramide; Prochlorperazine; and Promethazine  Family History  Problem Relation Age of Onset  . Arthritis Mother   . Hyperlipidemia Mother   . Hypertension Mother   . Migraines Mother   . Arthritis Father   . Autism Son   . Cancer Maternal Grandfather     pancreatic  . Stroke Maternal Grandfather   . Diabetes Paternal Grandmother     Social History Social History  Substance Use Topics  . Smoking status: Current Every Day Smoker -- 0.50 packs/day for  3 years  . Smokeless tobacco: None  . Alcohol Use: No   Review of Systems  Constitutional: Negative for fever. Eyes: Negative for visual changes. ENT: Negative for sore throat. Cardiovascular: Negative for chest pain. Respiratory: Negative for shortness of breath. Gastrointestinal: Negative for abdominal pain, vomiting and diarrhea. Genitourinary: Negative for dysuria. Musculoskeletal: Right ankle pain as above. Negative for back pain. Skin: Negative for rash. Neurological: Negative for headaches, focal weakness or numbness. ____________________________________________  PHYSICAL EXAM:  VITAL SIGNS: ED Triage Vitals  Enc Vitals Group     BP 08/20/15 1034 103/68 mmHg     Pulse Rate 08/20/15 1034 85     Resp 08/20/15 1034 16     Temp 08/20/15 1034 97.3 F (36.3 C)     Temp Source 08/20/15 1034 Oral     SpO2 08/20/15 1034 99 %     Weight 08/20/15 1034 127  lb (57.607 kg)     Height 08/20/15 1034 5\' 7"  (1.702 m)     Head Cir --      Peak Flow --      Pain Score 08/20/15 1058 8     Pain Loc --      Pain Edu? --      Excl. in [place]? --    Constitutional: Alert and oriented. Well appearing and in no distress. Head: Normocephalic and atraumatic.      Eyes: Conjunctivae are normal. PERRL. Normal extraocular movements      Ears: Canals clear. TMs intact bilaterally.   Nose: No congestion/rhinorrhea.   Mouth/Throat: Mucous membranes are moist.   Neck: Supple. No thyromegaly. Hematological/Lymphatic/Immunological: No cervical lymphadenopathy. Cardiovascular: Normal rate, regular rhythm. Normal distal pulses and cap refill. Respiratory: Normal respiratory effort. No wheezes/rales/rhonchi. Gastrointestinal: Soft and nontender. No distention. Musculoskeletal: Right ankle with obvious swelling noted to the lateral aspects. There is lateral ecchymosis appreciated about the malleolus and distally to the plantar surface of the foot. Patient with normal ankle flexion and extension. Nontender with normal range of motion in all extremities.  Neurologic:  Normal gait without ataxia. Normal speech and language. No gross focal neurologic deficits are appreciated. Skin:  Skin is warm, dry and intact. No rash noted. Psychiatric: Mood and affect are normal. Patient exhibits appropriate insight and judgment. ____________________________________________   RADIOLOGY Right Ankle IMPRESSION: No acute osseous injury of the right ankle.  I, Xenia Nile, Dannielle Karvonen, personally viewed and evaluated these images (plain radiographs) as part of my medical decision making, as well as reviewing the written report by the radiologist. ____________________________________________  PROCEDURES  Ace bandage Replace patient's ankle splint  ____________________________________________  INITIAL IMPRESSION / ASSESSMENT AND PLAN / ED COURSE  Grade 2 ankle sprain without  radiologic evidence of fracture or dislocation. Patient is again discharged to follow-up with her primary care provider as scheduled or orthopedics for ongoing symptoms. She is given instructions on RICE management of ankle sprains.A prescription for Ultram to dose with her daily IBU is provided.  ____________________________________________  FINAL CLINICAL IMPRESSION(S) / ED DIAGNOSES  Final diagnoses:  Ankle sprain, right, initial encounter      Melvenia Needles, PA-C 08/20/15 1315  Lavonia Drafts, MD 08/20/15 [phone]

## 2015-08-22 ENCOUNTER — Ambulatory Visit (INDEPENDENT_AMBULATORY_CARE_PROVIDER_SITE_OTHER): Payer: Medicaid Other | Admitting: Family Medicine

## 2015-08-22 ENCOUNTER — Encounter: Payer: Self-pay | Admitting: Family Medicine

## 2015-08-22 VITALS — BP 123/81 | HR 72 | Temp 98.3°F | Ht 67.3 in | Wt 137.0 lb

## 2015-08-22 DIAGNOSIS — M545 Low back pain, unspecified: Secondary | ICD-10-CM

## 2015-08-22 DIAGNOSIS — Z113 Encounter for screening for infections with a predominantly sexual mode of transmission: Secondary | ICD-10-CM

## 2015-08-22 DIAGNOSIS — S93401A Sprain of unspecified ligament of right ankle, initial encounter: Secondary | ICD-10-CM | POA: Diagnosis not present

## 2015-08-22 DIAGNOSIS — Z23 Encounter for immunization: Secondary | ICD-10-CM | POA: Diagnosis not present

## 2015-08-22 NOTE — Progress Notes (Signed)
BP 123/81 mmHg  Pulse 72  Temp(Src) 98.3 F (36.8 C)  Ht 5' 7.3" (1.709 m)  Wt 137 lb (62.143 kg)  BMI 21.28 kg/m2  SpO2 98%   Subjective:    Patient ID: Ellen Jackson, female    DOB: 1991/04/15, 24 y.o.   MRN: [id number]  HPI: Ellen Jackson is a 24 y.o. female  Chief Complaint  Patient presents with  . Back Pain    Patient would like to be referred to someone that can do back injections, she has had them in the past and they really helped her   BACK PAIN- had SI joint injections in [place] in the past about 5 years ago Duration: chronic, but has been bad for about a month and half Mechanism of injury: no trauma, has been in boot for her ankle today, and has been having her gait off Location: bilateral and low back Onset: sudden Severity: severe Quality: dull and aching Frequency: constant Radiation: none Aggravating factors: walking Alleviating factors: rest, ice, heat, NSAIDs and APAP Status: worse Treatments attempted: rest, ice, heat, APAP, ibuprofen and aleve  Relief with NSAIDs?: no Nighttime pain:  yes Paresthesias / decreased sensation:  no Bowel / bladder incontinence:  no Fevers:  no Dysuria / urinary frequency:  no   Relevant past medical, surgical, family and social history reviewed and updated as indicated. Interim medical history since our last visit reviewed. Allergies and medications reviewed and updated.  Review of Systems  Constitutional: Negative.   Respiratory: Negative.   Cardiovascular: Negative.   Musculoskeletal: Positive for myalgias, back pain, joint swelling, arthralgias and gait problem. Negative for neck pain and neck stiffness.  Psychiatric/Behavioral: Negative.     Per HPI unless specifically indicated above     Objective:    BP 123/81 mmHg  Pulse 72  Temp(Src) 98.3 F (36.8 C)  Ht 5' 7.3" (1.709 m)  Wt 137 lb (62.143 kg)  BMI 21.28 kg/m2  SpO2 98%  Wt Readings from Last 3 Encounters:  08/22/15 137 lb (62.143 kg)  08/20/15  127 lb (57.607 kg)  04/04/15 127 lb (57.607 kg)    Physical Exam  Constitutional: She is oriented to person, place, and time. She appears well-developed and well-nourished. No distress.  HENT:  Head: Normocephalic and atraumatic.  Right Ear: Hearing normal.  Left Ear: Hearing normal.  Nose: Nose normal.  Eyes: Conjunctivae and lids are normal. Right eye exhibits no discharge. Left eye exhibits no discharge. No scleral icterus.  Pulmonary/Chest: Effort normal. No respiratory distress.  Musculoskeletal: She exhibits edema and tenderness.  Severe bruising and decreased ROM, edema and tenderness to palpation of R ankle  Neurological: She is alert and oriented to person, place, and time.  Skin: Skin is warm, dry and intact. No rash noted. No erythema. No pallor.  Psychiatric: She has a normal mood and affect. Her speech is normal and behavior is normal. Judgment and thought content normal. Cognition and memory are normal.  Nursing note and vitals reviewed. Back Exam:    Inspection:  Normal spinal curvature.  No deformity, ecchymosis, erythema, or lesions     Palpation:     Midline spinal tenderness: no      Paralumbar tenderness: yes bilateral     Parathoracic tenderness: no      Buttocks tenderness: no     Range of Motion:      Flexion: Fingers to Knees     Extension:Decreased     Lateral bending:Decreased    Rotation:Decreased  Neuro Exam:Lower extremity DTRs normal & symmetric.  Strength and sensation intact.    Special Tests:      Straight leg raise:negative   Results for orders placed or performed in visit on 08/22/15  HIV antibody  Result Value Ref Range   HIV Screen 4th Generation wRfx Non Reactive Non Reactive      Assessment & Plan:   Problem List Items Addressed This Visit    None    Visit Diagnoses    Bilateral low back pain without sciatica    -  Primary    Acute on chronic low back pain. WIll obtain x-ray of low back. Has done SI joint injections in the past  with good results. Will refer for that. Recheck 2 weeks.    Relevant Orders    AMB referral to orthopedics    DG Lumbar Spine Complete    Immunization due        Tdap and flu given today.     Relevant Orders    Tdap vaccine greater than or equal to 7yo IM (Completed)    Flu vaccine greater than or equal to 3yo preservative free IM (Completed)    Routine screening for STI (sexually transmitted infection)        HIV screening today. Await results.     Relevant Orders    HIV antibody (Completed)    Ankle sprain, right, initial encounter        Off ankle for 2 weeks, RICE. Return in 2 weeks for reeval, if still so painful, consider MRI.         Follow up plan: Return in about 2 weeks (around 09/05/2015) for follow up ankle.

## 2015-08-23 ENCOUNTER — Encounter: Payer: Self-pay | Admitting: Family Medicine

## 2015-08-23 LAB — HIV ANTIBODY (ROUTINE TESTING W REFLEX): HIV Screen 4th Generation wRfx: NONREACTIVE

## 2015-09-05 ENCOUNTER — Ambulatory Visit: Payer: Medicaid Other | Admitting: Family Medicine

## 2015-09-12 ENCOUNTER — Encounter: Payer: Self-pay | Admitting: Emergency Medicine

## 2015-09-12 ENCOUNTER — Emergency Department
Admission: EM | Admit: 2015-09-12 | Discharge: 2015-09-12 | Disposition: A | Discharge status: Home / Self Care | Payer: Medicaid Other | Attending: Emergency Medicine | Admitting: Emergency Medicine

## 2015-09-12 DIAGNOSIS — Z79899 Other long term (current) drug therapy: Secondary | ICD-10-CM | POA: Diagnosis not present

## 2015-09-12 DIAGNOSIS — Z880 Allergy status to penicillin: Secondary | ICD-10-CM | POA: Insufficient documentation

## 2015-09-12 DIAGNOSIS — Z3202 Encounter for pregnancy test, result negative: Secondary | ICD-10-CM | POA: Diagnosis not present

## 2015-09-12 DIAGNOSIS — Z91040 Latex allergy status: Secondary | ICD-10-CM | POA: Diagnosis not present

## 2015-09-12 DIAGNOSIS — Z791 Long term (current) use of non-steroidal anti-inflammatories (NSAID): Secondary | ICD-10-CM | POA: Diagnosis not present

## 2015-09-12 DIAGNOSIS — G43809 Other migraine, not intractable, without status migrainosus: Secondary | ICD-10-CM

## 2015-09-12 DIAGNOSIS — F17200 Nicotine dependence, unspecified, uncomplicated: Secondary | ICD-10-CM | POA: Diagnosis not present

## 2015-09-12 DIAGNOSIS — R51 Headache: Secondary | ICD-10-CM | POA: Diagnosis present

## 2015-09-12 LAB — POCT PREGNANCY, URINE: Preg Test, Ur: NEGATIVE

## 2015-09-12 MED ORDER — KETOROLAC TROMETHAMINE 30 MG/ML IJ SOLN: 30.0000 mg | Freq: Once | INTRAMUSCULAR | Status: DC

## 2015-09-12 MED ORDER — SODIUM CHLORIDE 0.9 % IV BOLUS (SEPSIS)
1000.0000 mL | Freq: Once | INTRAVENOUS | Status: AC
  Administered 2015-09-12: 1000 mL via INTRAVENOUS

## 2015-09-12 MED ORDER — MAGNESIUM SULFATE 2 GM/50ML IV SOLN
2.0000 g | Freq: Once | INTRAVENOUS | Status: AC
  Administered 2015-09-12: 2 g via INTRAVENOUS
  Filled 2015-09-12: qty 50

## 2015-09-12 MED ORDER — KETOROLAC TROMETHAMINE 30 MG/ML IJ SOLN
INTRAMUSCULAR | Status: AC
  Administered 2015-09-12: 30 mg via INTRAVENOUS
  Filled 2015-09-12: qty 1

## 2015-09-12 MED ORDER — KETOROLAC TROMETHAMINE 30 MG/ML IJ SOLN
30.0000 mg | Freq: Once | INTRAMUSCULAR | Status: AC
  Administered 2015-09-12: 30 mg via INTRAVENOUS

## 2015-09-12 NOTE — Discharge Instructions (Signed)
Please seek medical attention for any high fevers, chest pain, shortness of breath, change in behavior, persistent vomiting, bloody stool or any other new or concerning symptoms.   Migraine Headache A migraine headache is an intense, throbbing pain on one or both sides of your head. A migraine can last for 30 minutes to several hours. CAUSES  The exact cause of a migraine headache is not always known. However, a migraine may be caused when nerves in the brain become irritated and release chemicals that cause inflammation. This causes pain. Certain things may also trigger migraines, such as:  Alcohol.  Smoking.  Stress.  Menstruation.  Aged cheeses.  Foods or drinks that contain nitrates, glutamate, aspartame, or tyramine.  Lack of sleep.  Chocolate.  Caffeine.  Hunger.  Physical exertion.  Fatigue.  Medicines used to treat chest pain (nitroglycerine), birth control pills, estrogen, and some blood pressure medicines. SIGNS AND SYMPTOMS  Pain on one or both sides of your head.  Pulsating or throbbing pain.  Severe pain that prevents daily activities.  Pain that is aggravated by any physical activity.  Nausea, vomiting, or both.  Dizziness.  Pain with exposure to bright lights, loud noises, or activity.  General sensitivity to bright lights, loud noises, or smells. Before you get a migraine, you may get warning signs that a migraine is coming (aura). An aura may include:  Seeing flashing lights.  Seeing bright spots, halos, or zigzag lines.  Having tunnel vision or blurred vision.  Having feelings of numbness or tingling.  Having trouble talking.  Having muscle weakness. DIAGNOSIS  A migraine headache is often diagnosed based on:  Symptoms.  Physical exam.  A CT scan or MRI of your head. These imaging tests cannot diagnose migraines, but they can help rule out other causes of headaches. TREATMENT Medicines may be given for pain and nausea.  Medicines can also be given to help prevent recurrent migraines.  HOME CARE INSTRUCTIONS  Only take over-the-counter or prescription medicines for pain or discomfort as directed by your health care provider. The use of long-term narcotics is not recommended.  Lie down in a dark, quiet room when you have a migraine.  Keep a journal to find out what may trigger your migraine headaches. For example, write down:  What you eat and drink.  How much sleep you get.  Any change to your diet or medicines.  Limit alcohol consumption.  Quit smoking if you smoke.  Get 7-9 hours of sleep, or as recommended by your health care provider.  Limit stress.  Keep lights dim if bright lights bother you and make your migraines worse. SEEK IMMEDIATE MEDICAL CARE IF:   Your migraine becomes severe.  You have a fever.  You have a stiff neck.  You have vision loss.  You have muscular weakness or loss of muscle control.  You start losing your balance or have trouble walking.  You feel faint or pass out.  You have severe symptoms that are different from your first symptoms. MAKE SURE YOU:   Understand these instructions.  Will watch your condition.  Will get help right away if you are not doing well or get worse.   This information is not intended to replace advice given to you by your health care provider. Make sure you discuss any questions you have with your health care provider.   Document Released: 08/10/2005 Document Revised: 08/31/2014 Document Reviewed: 04/17/2013 Elsevier Interactive Patient Education [employer].

## 2015-09-12 NOTE — ED Notes (Signed)
Responded to call bell.  Pt c/o of headache. Requesting to see MD.  MD made aware.  Apologies made for wait.

## 2015-09-12 NOTE — ED Notes (Signed)
Pt reports that she has relief from nausea and that she does feel some better. Reports that she is still having pain behind right eye which she states is typical for her migraines.

## 2015-09-12 NOTE — ED Provider Notes (Signed)
[facility] Emergency Department Provider Note    ____________________________________________  Time seen: 1545  I have reviewed the triage vital signs and the nursing notes.   HISTORY  Chief Complaint Headache   History limited by: Not Limited   HPI Ellen Jackson is a 25 y.o. female with history of migraines who presents to the emergency department today because of migraine headache. She states that it started two days ago and has been constant. She says it is severe. Located primarily on the right side and behind her right ear. Has had some nausea. Denies any fevers. States it feels like her previous migraines.    Past Medical History  Diagnosis Date  . SVT (supraventricular tachycardia) ([place])   . Migraine with aura   . Allergy   . Hernia, umbilical   . IBS (irritable bowel syndrome)   . Spina bifida occulta   . Ovarian cyst     Patient Active Problem List   Diagnosis Date Noted  . Anxiety 02/05/2015  . Headache, migraine 02/05/2015  . Clinical depression 02/05/2015  . 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 Paroxysmal supraventricular tachycardia ([place]) 02/05/2015  . LLQ abdominal pain 02/05/2015  . Gonalgia 10/16/2014    Past Surgical History  Procedure Laterality Date  . Knee arthroscopy      X 3  . Cholecystectomy    . Cesarean section  2011  . Appendectomy    . Hernia repair      Current Outpatient Rx  Name  Route  Sig  Dispense  Refill  . citalopram (CELEXA) 20 MG tablet               . ibuprofen (ADVIL,MOTRIN) 600 MG tablet      TK 1 T PO Q 6 H PRN P      0   . medroxyPROGESTERone (DEPO-PROVERA) 150 MG/ML injection   Intramuscular   Inject 150 mg into the muscle every 3 (three) months.         . metoprolol succinate (TOPROL-XL) 25 MG 24 hr tablet   Oral   Take by mouth.         . ranitidine (ZANTAC) 150 MG tablet   Oral   Take by mouth.            Allergies Codeine; Morphine; Nalbuphine; Ondansetron hcl; Other; Penicillins; Latex; Metoclopramide; Prochlorperazine; and Promethazine  Family History  Problem Relation Age of Onset  . Arthritis Mother   . Hyperlipidemia Mother   . Hypertension Mother   . Migraines Mother   . Arthritis Father   . Autism Son   . Cancer Maternal Grandfather     pancreatic  . Stroke Maternal Grandfather   . Diabetes Paternal Grandmother     Social History Social History  Substance Use Topics  . Smoking status: Current Every Day Smoker -- 0.50 packs/day for 3 years  . Smokeless tobacco: None  . Alcohol Use: No    Review of Systems  Constitutional: Negative for fever. Cardiovascular: Negative for chest pain. Respiratory: Negative for shortness of breath. Gastrointestinal: Negative for abdominal pain, vomiting and diarrhea. Neurological: Positive for headache.   10-point ROS otherwise negative.  ____________________________________________   PHYSICAL EXAM:  VITAL SIGNS: ED Triage Vitals  Enc Vitals Group     BP 09/12/15 1400 101/71 mmHg     Pulse Rate 09/12/15 1400 86     Resp 09/12/15 1400 18     Temp  09/12/15 1400 97.8 F (36.6 C)     Temp src --      SpO2 09/12/15 1400 97 %     Weight 09/12/15 1400 127 lb (57.607 kg)     Height 09/12/15 1400 5\' 7"  (1.702 m)     Head Cir --      Peak Flow --      Pain Score 09/12/15 1359 10   Constitutional: Alert and oriented. Well appearing and in no distress. Eyes: Conjunctivae are normal. PERRL. Normal extraocular movements. ENT   Head: Normocephalic and atraumatic.   Nose: No congestion/rhinnorhea.   Mouth/Throat: Mucous membranes are moist.   Neck: No stridor. Hematological/Lymphatic/Immunilogical: No cervical lymphadenopathy. Cardiovascular: Normal rate, regular rhythm.  No murmurs, rubs, or gallops. Respiratory: Normal respiratory effort without tachypnea nor retractions. Breath sounds are clear and equal  bilaterally. No wheezes/rales/rhonchi. Gastrointestinal: Soft and nontender. No distention. There is no CVA tenderness. Genitourinary: Deferred Musculoskeletal: Normal range of motion in all extremities. No joint effusions.   Neurologic:  Normal speech and language. No gross focal neurologic deficits are appreciated.  Skin:  Skin is warm, dry and intact. No rash noted. Psychiatric: Mood and affect are normal. Speech and behavior are normal. Patient exhibits appropriate insight and judgment.  ____________________________________________    LABS (pertinent positives/negatives)  Labs Reviewed  POCT PREGNANCY, URINE     ____________________________________________   EKG  None  ____________________________________________    RADIOLOGY  None   ____________________________________________   PROCEDURES  Procedure(s) performed: None  Critical Care performed: No  ____________________________________________   INITIAL IMPRESSION / ASSESSMENT AND PLAN / ED COURSE  Pertinent labs & imaging results that were available during my care of the patient were reviewed by me and considered in my medical decision making (see chart for details).  Patient presents to the emergency department today because of concerns for migraine headache for 2 days. Patient has a history of a unique headaches and no focal neuro exam findings. I do not feel neuro imaging is not warranted at this time on an emergent basis. Will plan on giving IV fluids and medications to help relieve the patient's pain.  ----------------------------------------- 6:34 PM on 09/12/2015 -----------------------------------------  Patient states she feels better at this time. Patient states she will follow up with primary care doctor. Will discharge.  ____________________________________________   FINAL CLINICAL IMPRESSION(S) / ED DIAGNOSES  Final diagnoses:  Other type of migraine     Nance Pear,  MD 09/12/15 [id number]

## 2015-09-12 NOTE — ED Notes (Signed)
States she developed a headache  2 days ago   Pos nausea  And photosensitivty

## 2015-09-12 NOTE — ED Notes (Signed)
States migrane for 2 days, states hx of migrans, right side behind her eye, taken OTC meds but with no relief, 10/10 pain

## 2015-09-19 ENCOUNTER — Ambulatory Visit (INDEPENDENT_AMBULATORY_CARE_PROVIDER_SITE_OTHER): Payer: Medicaid Other

## 2015-09-19 ENCOUNTER — Telehealth: Payer: Self-pay

## 2015-09-19 ENCOUNTER — Other Ambulatory Visit: Payer: Self-pay | Admitting: Family Medicine

## 2015-09-19 DIAGNOSIS — Z3049 Encounter for surveillance of other contraceptives: Secondary | ICD-10-CM | POA: Diagnosis not present

## 2015-09-19 DIAGNOSIS — Z3042 Encounter for surveillance of injectable contraceptive: Secondary | ICD-10-CM

## 2015-09-19 MED ORDER — MEDROXYPROGESTERONE ACETATE 150 MG/ML IM SUSP
150.0000 mg | INTRAMUSCULAR | Status: DC
  Administered 2015-09-19: 150 mg via INTRAMUSCULAR

## 2015-09-19 NOTE — Telephone Encounter (Signed)
Pt scheduled for tomorrow 09/20/15 @ 9:30am. Thanks.

## 2015-09-19 NOTE — Telephone Encounter (Signed)
Patient came in today for her depo-shot.   Patient stated she had a follow-up appointment for her ankle injury last week, and stated she was told that if she was no better, an MRI would be ordered  Patient said that she missed her appointment, but she isn't any better. Could Dr. Wynetta Emery order an MRI?  I explained to her that Dr. Wynetta Emery may would want to see you before the MRI or she may want to see you after the MRI.  I told her that I would send a message to see what is decided, and would call her regarding the next step that needs to be taken.

## 2015-09-19 NOTE — Telephone Encounter (Signed)
Routing to scheduler

## 2015-09-19 NOTE — Telephone Encounter (Signed)
I really need to see her for insurance purposes

## 2015-09-20 ENCOUNTER — Encounter: Payer: Self-pay | Admitting: Family Medicine

## 2015-09-20 ENCOUNTER — Ambulatory Visit (INDEPENDENT_AMBULATORY_CARE_PROVIDER_SITE_OTHER): Payer: Medicaid Other | Admitting: Family Medicine

## 2015-09-20 VITALS — BP 113/77 | HR 79 | Temp 98.7°F | Ht 67.1 in | Wt 139.0 lb

## 2015-09-20 DIAGNOSIS — S93401D Sprain of unspecified ligament of right ankle, subsequent encounter: Secondary | ICD-10-CM | POA: Diagnosis not present

## 2015-09-20 NOTE — Progress Notes (Signed)
BP 113/77 mmHg  Pulse 79  Temp(Src) 98.7 F (37.1 C)  Ht 5' 7.1" (1.704 m)  Wt 139 lb (63.05 kg)  BMI 21.71 kg/m2  SpO2 99%   Subjective:    Patient ID: Ellen Jackson, female    DOB: [birth year], 25 y.o.   MRN: [id number]  HPI: Ellen Jackson is a 25 y.o. female  Chief Complaint  Patient presents with  . Ankle Pain    Patient has had ankle pain since 08/16/15.   ANKLE PAIN- stayed off of it for the first couple of weeks, but has been on it again for the past 2 weeks. Has been elevating it and icing it. Much of the bruising and the swelling has resolved, but it's starting to bruise again and the pain has really not resolved at all. She is concerned that something else is going on  Duration: 1 month Involved ankle: right Mechanism of injury: trauma Location: lateral and deep inside at the tibio-tallar joint Onset: sudden  Severity: moderate  Quality:  sharp and aching Frequency: constant Radiation: yes into fibula on the R Aggravating factors: weight bearing, walking, running, stairs, bending and movement  Alleviating factors: ice, APAP, NSAIDs, crutches and rest  Status: stable Treatments attempted: rest, ice, heat, APAP, ibuprofen and aleve  Relief with NSAIDs?:  mild Weakness with weight bearing or walking: yes Morning stiffness: no Swelling: yes Redness: no Bruising: yes Paresthesias / decreased sensation: yes  Fevers:no  Relevant past medical, surgical, family and social history reviewed and updated as indicated. Interim medical history since our last visit reviewed. Allergies and medications reviewed and updated.  Review of Systems  Constitutional: Negative.   Respiratory: Negative.   Cardiovascular: Negative.   Genitourinary: Negative.   Musculoskeletal: Positive for myalgias, back pain, joint swelling, arthralgias and gait problem. Negative for neck pain and neck stiffness.  Psychiatric/Behavioral: Negative.     Per HPI unless specifically indicated above     Objective:    BP 113/77 mmHg  Pulse 79  Temp(Src) 98.7 F (37.1 C)  Ht 5' 7.1" (1.704 m)  Wt 139 lb (63.05 kg)  BMI 21.71 kg/m2  SpO2 99%  Wt Readings from Last 3 Encounters:  09/20/15 139 lb (63.05 kg)  09/12/15 127 lb (57.607 kg)  08/22/15 137 lb (62.143 kg)    Physical Exam  Constitutional: She is oriented to person, place, and time. She appears well-developed and well-nourished. No distress.  HENT:  Head: Normocephalic and atraumatic.  Right Ear: Hearing normal.  Left Ear: Hearing normal.  Nose: Nose normal.  Eyes: Conjunctivae and lids are normal. Right eye exhibits no discharge. Left eye exhibits no discharge. No scleral icterus.  Pulmonary/Chest: Effort normal. No respiratory distress.  Neurological: She is alert and oriented to person, place, and time.  Skin: Skin is warm, dry and intact. No rash noted. No erythema. No pallor.  Psychiatric: She has a normal mood and affect. Her speech is normal and behavior is normal. Judgment and thought content normal. Cognition and memory are normal.  Nursing note and vitals reviewed.  Ankle Exam: Right    Inspection: Ankle inspection normal, slight swelling at lateral maleolus and metatarsals     Tenderness to Palpation:       Proximal fibula: no    Distal fibula: yes    Medial malleolus: no    Lateral malleolus: yes    Base of 5th metatarsal: yes     Anterior talofibular ligament: yes    Calcaneofibular ligament: yes  Posterior talofibular ligament: yes    Deltoid ligament: yes    Syndesmosis: no     Range of Motion:     Dorsiflexion: Decreased    Plantar flexion: Decreased     Strength:  Abnormal    Dorsiflexion:  4/5    Plantar flexion:  4/5     Special Tests:    Anterior drawer: positive    Talar tilt: negative    Syndesmosis squeeze test: positive     Gait:  moderate antalgic    Results for orders placed or performed during the hospital encounter of 09/12/15  Pregnancy, urine POC  Result Value Ref  Range   Preg Test, Ur NEGATIVE NEGATIVE      Assessment & Plan:   Problem List Items Addressed This Visit    None    Visit Diagnoses    Ankle sprain, right, subsequent encounter    -  Primary    Possibly still slower healing high grade sprain. She is very concerned. Will see if we can get her into ortho, if not will consider MRI of the ankle.     Relevant Orders    Ambulatory referral to Orthopedic Surgery        Follow up plan: Return if symptoms worsen or fail to improve.

## 2015-10-16 ENCOUNTER — Ambulatory Visit
Admission: RE | Admit: 2015-10-16 | Discharge: 2015-10-16 | Disposition: A | Discharge status: Home / Self Care | Payer: Medicaid Other | Source: Ambulatory Visit | Attending: Family Medicine | Admitting: Family Medicine

## 2015-10-16 ENCOUNTER — Telehealth: Payer: Self-pay | Admitting: Family Medicine

## 2015-10-16 DIAGNOSIS — M545 Low back pain, unspecified: Secondary | ICD-10-CM

## 2015-10-16 DIAGNOSIS — M4186 Other forms of scoliosis, lumbar region: Secondary | ICD-10-CM | POA: Diagnosis not present

## 2015-10-16 NOTE — Telephone Encounter (Signed)
Patient notified

## 2015-10-16 NOTE — Telephone Encounter (Signed)
Please let her know that her x-ray of her back came back normal. Thanks!

## 2015-10-18 ENCOUNTER — Other Ambulatory Visit: Payer: Self-pay | Admitting: Orthopedic Surgery

## 2015-10-18 DIAGNOSIS — M5136 Other intervertebral disc degeneration, lumbar region: Secondary | ICD-10-CM

## 2015-11-07 ENCOUNTER — Ambulatory Visit: Admission: RE | Admit: 2015-11-07 | Payer: Medicaid Other | Source: Ambulatory Visit

## 2015-11-21 ENCOUNTER — Telehealth: Payer: Self-pay | Admitting: Family Medicine

## 2015-11-21 MED ORDER — METOPROLOL SUCCINATE ER 25 MG PO TB24: 25.0000 mg | ORAL_TABLET | Freq: Every day | ORAL | Status: DC

## 2015-11-21 NOTE — Telephone Encounter (Signed)
Christan: Please let patient know that medication was sent and get her scheduled.

## 2015-11-21 NOTE — Telephone Encounter (Signed)
metoprolol succinate (TOPROL-XL) 25 MG 24 hr tablet Pharmacy:[id number] - [place], [place] [address] AT [facility] [address]  Patient needs refill on her medication sent to her pharmacy, thanks.

## 2015-11-21 NOTE — Telephone Encounter (Signed)
Rx sent to her pharmacy. Needs a regular follow up please.

## 2015-11-21 NOTE — Telephone Encounter (Signed)
Forward to provider

## 2015-12-05 ENCOUNTER — Encounter: Payer: Medicaid Other | Admitting: Family Medicine

## 2015-12-05 ENCOUNTER — Ambulatory Visit: Payer: Medicaid Other

## 2015-12-08 ENCOUNTER — Emergency Department
Admission: EM | Admit: 2015-12-08 | Discharge: 2015-12-08 | Disposition: A | Discharge status: Home / Self Care | Payer: Medicaid Other | Attending: Emergency Medicine | Admitting: Emergency Medicine

## 2015-12-08 ENCOUNTER — Encounter: Payer: Self-pay | Admitting: Emergency Medicine

## 2015-12-08 ENCOUNTER — Emergency Department: Payer: Medicaid Other

## 2015-12-08 DIAGNOSIS — R079 Chest pain, unspecified: Secondary | ICD-10-CM | POA: Diagnosis present

## 2015-12-08 DIAGNOSIS — K429 Umbilical hernia without obstruction or gangrene: Secondary | ICD-10-CM | POA: Diagnosis not present

## 2015-12-08 DIAGNOSIS — Z79899 Other long term (current) drug therapy: Secondary | ICD-10-CM | POA: Insufficient documentation

## 2015-12-08 DIAGNOSIS — Q760 Spina bifida occulta: Secondary | ICD-10-CM | POA: Diagnosis not present

## 2015-12-08 DIAGNOSIS — I471 Supraventricular tachycardia: Secondary | ICD-10-CM | POA: Diagnosis not present

## 2015-12-08 DIAGNOSIS — G43709 Chronic migraine without aura, not intractable, without status migrainosus: Secondary | ICD-10-CM | POA: Insufficient documentation

## 2015-12-08 DIAGNOSIS — K589 Irritable bowel syndrome without diarrhea: Secondary | ICD-10-CM | POA: Diagnosis not present

## 2015-12-08 DIAGNOSIS — F17200 Nicotine dependence, unspecified, uncomplicated: Secondary | ICD-10-CM | POA: Insufficient documentation

## 2015-12-08 DIAGNOSIS — A084 Viral intestinal infection, unspecified: Secondary | ICD-10-CM | POA: Diagnosis not present

## 2015-12-08 DIAGNOSIS — G43109 Migraine with aura, not intractable, without status migrainosus: Secondary | ICD-10-CM | POA: Diagnosis not present

## 2015-12-08 LAB — CBC
HEMATOCRIT: 40.2 % (ref 35.0–47.0)
HEMOGLOBIN: 13.5 g/dL (ref 12.0–16.0)
MCH: 30.7 pg (ref 26.0–34.0)
MCHC: 33.6 g/dL (ref 32.0–36.0)
MCV: 91.5 fL (ref 80.0–100.0)
Platelets: 135 10*3/uL — ABNORMAL LOW (ref 150–440)
RBC: 4.4 MIL/uL (ref 3.80–5.20)
RDW: 13 % (ref 11.5–14.5)
WBC: 12.3 10*3/uL — ABNORMAL HIGH (ref 3.6–11.0)

## 2015-12-08 LAB — COMPREHENSIVE METABOLIC PANEL
ALBUMIN: 3.9 g/dL (ref 3.5–5.0)
ALT: 21 U/L (ref 14–54)
AST: 28 U/L (ref 15–41)
Alkaline Phosphatase: 61 U/L (ref 38–126)
Anion gap: 6 (ref 5–15)
BUN: 17 mg/dL (ref 6–20)
CHLORIDE: 108 mmol/L (ref 101–111)
CO2: 22 mmol/L (ref 22–32)
CREATININE: 0.72 mg/dL (ref 0.44–1.00)
Calcium: 8.8 mg/dL — ABNORMAL LOW (ref 8.9–10.3)
GFR calc Af Amer: >60 mL/min (ref >60)
Glucose, Bld: 101 mg/dL — ABNORMAL HIGH (ref 65–99)
POTASSIUM: 3.7 mmol/L (ref 3.5–5.1)
Sodium: 136 mmol/L (ref 135–145)
Total Bilirubin: 1.3 mg/dL — ABNORMAL HIGH (ref 0.3–1.2)
Total Protein: 6.6 g/dL (ref 6.5–8.1)

## 2015-12-08 LAB — TROPONIN I

## 2015-12-08 MED ORDER — LORAZEPAM 1 MG PO TABS: 1.0000 mg | ORAL_TABLET | Freq: Three times a day (TID) | ORAL | Status: DC | PRN

## 2015-12-08 MED ORDER — HYDROCODONE-ACETAMINOPHEN 5-325 MG PO TABS
1.0000 | ORAL_TABLET | Freq: Four times a day (QID) | ORAL | Status: DC | PRN
Start: 2015-12-08 — End: 2016-01-13

## 2015-12-08 MED ORDER — KETOROLAC TROMETHAMINE 30 MG/ML IJ SOLN
15.0000 mg | Freq: Once | INTRAMUSCULAR | Status: AC
  Administered 2015-12-08: 15 mg via INTRAVENOUS
  Filled 2015-12-08: qty 1

## 2015-12-08 MED ORDER — LORAZEPAM 2 MG/ML IJ SOLN
1.0000 mg | Freq: Once | INTRAMUSCULAR | Status: AC
  Administered 2015-12-08: 1 mg via INTRAVENOUS
  Filled 2015-12-08: qty 1

## 2015-12-08 NOTE — ED Notes (Signed)
Patient brought in by [facility] from home. Patient states that this morning around 0830 she got a headache and also started experiencing nausea and diarrhea. Around 1600 she started having pain in her epigastric area that extends up into her chest. Patient also states that she had some shortness of breath. Patient has a hx/o SVT. Patient is in no acute distress at this time. Per EMS all vital stable. Patient was given 324 mg of Aspirin by EMS

## 2015-12-08 NOTE — ED Provider Notes (Signed)
Time Seen: Approximately 1815 I have reviewed the triage notes  Chief Complaint: Chest Pain   History of Present Illness: Ellen Jackson is a 25 y.o. female *who presents with chest pain and numerous other concerns. She states she started this morning with a headache described as her typical migraine headache. She then started with some nausea and diarrhea. She still has some mild nausea but no persistent vomiting. She describes approximately 8 loose watery stools. She states around 4 PM she started having pain in the epigastric area and up into her chest. Today she had some associated shortness of breath. She is not aware of any fever, weakness, arm, jaw pain. She is not aware of any food borne exposure. She denies any recent travel or antibiotic therapy.   Past Medical History  Diagnosis Date  . SVT (supraventricular tachycardia) ([place])   . Migraine with aura   . Allergy   . Hernia, umbilical   . IBS (irritable bowel syndrome)   . Spina bifida occulta   . Ovarian cyst     Patient Active Problem List   Diagnosis Date Noted  . Anxiety 02/05/2015  . Headache, migraine 02/05/2015  . Clinical depression 02/05/2015  . 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 Paroxysmal supraventricular tachycardia ([place]) 02/05/2015  . LLQ abdominal pain 02/05/2015  . Gonalgia 10/16/2014    Past Surgical History  Procedure Laterality Date  . Knee arthroscopy      X 3  . Cholecystectomy    . Cesarean section  2011  . Appendectomy    . Hernia repair      Past Surgical History  Procedure Laterality Date  . Knee arthroscopy      X 3  . Cholecystectomy    . Cesarean section  2011  . Appendectomy    . Hernia repair      Current Outpatient Rx  Name  Route  Sig  Dispense  Refill  . citalopram (CELEXA) 40 MG tablet   Oral   Take 40 mg by mouth daily.      1   . ibuprofen (ADVIL,MOTRIN) 600 MG tablet      TK 1 T PO Q 6 H PRN P       0   . medroxyPROGESTERone (DEPO-PROVERA) 150 MG/ML injection   Intramuscular   Inject 150 mg into the muscle every 3 (three) months.         . metoprolol succinate (TOPROL-XL) 25 MG 24 hr tablet   Oral   Take 1 tablet (25 mg total) by mouth daily.   90 tablet   1   . EXPIRED: ranitidine (ZANTAC) 150 MG tablet   Oral   Take by mouth.           Allergies:  Codeine; Morphine; Nalbuphine; Ondansetron hcl; Other; Penicillins; Latex; Metoclopramide; Prochlorperazine; and Promethazine  Family History: Family History  Problem Relation Age of Onset  . Arthritis Mother   . Hyperlipidemia Mother   . Hypertension Mother   . Migraines Mother   . Arthritis Father   . Autism Son   . Cancer Maternal Grandfather     pancreatic  . Stroke Maternal Grandfather   . Diabetes Paternal Grandmother     Social History: Social History  Substance Use Topics  . Smoking status: Current Every Day Smoker -- 0.50 packs/day for 3 years  . Smokeless tobacco: None  . Alcohol Use: No     Review of  Systems:   10 point review of systems was performed and was otherwise negative:  Constitutional: No fever. No neck pain Eyes: No visual disturbances. No photophobia. ENT: No sore throat, ear pain Cardiac: No chest pain Respiratory: No shortness of breath, wheezing, or stridor Abdomen: Epigastric pain with nausea, , a Endocrine: No weight loss, No night sweats Extremities: No peripheral edema, cyanosis Skin: No rashes, easy bruising Neurologic: No focal weakness, trouble with speech or swollowing Urologic: No dysuria, Hematuria, or urinary frequency   Physical Exam:  ED Triage Vitals  Enc Vitals Group     BP 12/08/15 1812 114/83 mmHg     Pulse Rate 12/08/15 1812 80     Resp 12/08/15 1812 14     Temp 12/08/15 1812 98.1 F (36.7 C)     Temp Source 12/08/15 1812 Oral     SpO2 12/08/15 1812 100 %     Weight 12/08/15 1812 127 lb (57.607 kg)     Height 12/08/15 1812 5\' 7"  (1.702 m)      Head Cir --      Peak Flow --      Pain Score 12/08/15 1812 5     Pain Loc --      Pain Edu? --      Excl. in [place]? --     General: Awake , Alert , and Oriented times 3; GCS 15Anxious Head: Normal cephalic , atraumatic Eyes: Pupils equal , round, reactive to light Nose/Throat: No nasal drainage, patent upper airway without erythema or exudate.  Neck: Supple, Full range of motion, No anterior adenopathy or palpable thyroid masses Lungs: Clear to ascultation without wheezes , rhonchi, or rales Heart: Regular rate, regular rhythm without murmurs , gallops , or rubs Abdomen: Soft, non tender without rebound, guarding , or rigidity; bowel sounds positive and symmetric in all 4 quadrants. No organomegaly .        Extremities: 2 plus symmetric pulses. No edema, clubbing or cyanosis Neurologic: normal ambulation, Motor symmetric without deficits, sensory intact Skin: warm, dry, no rashes   Labs:   All laboratory work was reviewed including any pertinent negatives or positives listed below:  Labs Reviewed  CBC - Abnormal; Notable for the following:    WBC 12.3 (*)    Platelets 135 (*)    All other components within normal limits  COMPREHENSIVE METABOLIC PANEL - Abnormal; Notable for the following:    Glucose, Bld 101 (*)    Calcium 8.8 (*)    Total Bilirubin 1.3 (*)    All other components within normal limits  TROPONIN I    EKG:  ED ECG REPORT I, Daymon Larsen, the attending physician, personally viewed and interpreted this ECG.  Date: 12/08/2015 EKG Time: 1811 Rate: 78 Rhythm: normal sinus rhythm QRS Axis: normal Intervals: normal ST/T Wave abnormalities: normal Conduction Disturbances: none Narrative Interpretation: unremarkable No acute ischemic changes  Radiology:    EXAM: CHEST 2 VIEW  COMPARISON: 07/20/2014 chest radiograph.  FINDINGS: Stable cardiomediastinal silhouette with normal heart size. No pneumothorax. No pleural effusion. Lungs appear clear,  with no acute consolidative airspace disease and no pulmonary edema. Cholecystectomy clips are seen in the right upper quadrant of the abdomen.  IMPRESSION: No active cardiopulmonary disease.    I personally reviewed the radiologic studies     ED Course:  Patient's stay here showed symptomatic improvement. She presents with numerous complaints including exacerbation of her chronic migraines. I felt this was not a significant life-threatening cause of her  headache such as subarachnoid hemorrhage, cavernous venous thrombosis, meningitis, etc. Her headache seems to improve with fluids, and Ativan. She doesn't tolerate a lot of anti-medic medications well and her nausea seemed to be also controlled with the Ativan. The patient also received some low-dose Toradol for her headache. Her diarrhea and nausea is most likely gastrointestinal virus. She did not have any episodes of diarrhea here in emergency department. She was given some IV fluids and I feel she is significantly dehydrated. I do not think it is some form invasive diarrhea. The patient's chest pain is likely not to be life threatening in nature either with a differential including acute coronary syndrome, pulmonary embolism, etc. She had some nausea with it and this may be some form of reflux.   Assessment:  Acute exacerbation of chronic migraine headache Viral gastroenteritis Unspecified chest pain     Plan: * Outpatient Patient was advised to return immediately if condition worsens. Patient was advised to follow up with their primary care physician or other specialized physicians involved in their outpatient care. The patient and/or family member/power of attorney had laboratory results reviewed at the bedside. All questions and concerns were addressed and appropriate discharge instructions were distributed by the nursing staff.             Daymon Larsen, MD 12/08/15 2038

## 2015-12-13 ENCOUNTER — Ambulatory Visit (INDEPENDENT_AMBULATORY_CARE_PROVIDER_SITE_OTHER): Payer: Medicaid Other

## 2015-12-13 DIAGNOSIS — Z308 Encounter for other contraceptive management: Secondary | ICD-10-CM

## 2016-01-13 ENCOUNTER — Encounter: Payer: Self-pay | Admitting: Family Medicine

## 2016-01-13 ENCOUNTER — Ambulatory Visit (INDEPENDENT_AMBULATORY_CARE_PROVIDER_SITE_OTHER): Payer: Medicaid Other | Admitting: Family Medicine

## 2016-01-13 VITALS — BP 106/70 | HR 73 | Temp 98.0°F | Ht 67.1 in | Wt 140.0 lb

## 2016-01-13 DIAGNOSIS — J019 Acute sinusitis, unspecified: Secondary | ICD-10-CM | POA: Diagnosis not present

## 2016-01-13 MED ORDER — TRIAMCINOLONE ACETONIDE 55 MCG/ACT NA AERO: 2.0000 | INHALATION_SPRAY | Freq: Every day | NASAL | Status: DC

## 2016-01-13 NOTE — Progress Notes (Signed)
BP 106/70 mmHg  Pulse 73  Temp(Src) 98 F (36.7 C)  Ht 5' 7.1" (1.704 m)  Wt 140 lb (63.504 kg)  BMI 21.87 kg/m2  SpO2 95%   Subjective:    Patient ID: Ellen Jackson, female    DOB: 28-Jun-1991, 25 y.o.   MRN: [id number]  HPI: Ellen Jackson is a 25 y.o. female  Chief Complaint  Patient presents with  . URI    x 1week, went to [facility]   Patient's been on clindamycin for 2-1/2 days with marked sinus pressure congestion feeling bad right ear stopped up. Has tried multiple over-the-counter medications with no relief.  Relevant past medical, surgical, family and social history reviewed and updated as indicated. Interim medical history since our last visit reviewed. Allergies and medications reviewed and updated.  Review of Systems  Constitutional: Positive for fever, chills and fatigue.  HENT: Positive for congestion, ear pain, hearing loss, postnasal drip, rhinorrhea, sinus pressure, sneezing and sore throat.   Respiratory: Positive for cough.   Cardiovascular: Negative.     Per HPI unless specifically indicated above     Objective:    BP 106/70 mmHg  Pulse 73  Temp(Src) 98 F (36.7 C)  Ht 5' 7.1" (1.704 m)  Wt 140 lb (63.504 kg)  BMI 21.87 kg/m2  SpO2 95%  Wt Readings from Last 3 Encounters:  01/13/16 140 lb (63.504 kg)  12/08/15 127 lb (57.607 kg)  09/20/15 139 lb (63.05 kg)    Physical Exam  Constitutional: She is oriented to person, place, and time. She appears well-developed and well-nourished. No distress.  HENT:  Head: Normocephalic and atraumatic.  Right Ear: Hearing normal.  Left Ear: Hearing and external ear normal.  Nose: Nose normal.  Mouth/Throat: Oropharyngeal exudate present.  Right ear clogged with wax removed with water revealing normal canal and TM procedure done by me. Asian tolerated the procedure well with relief of symptoms  Eyes: Conjunctivae and lids are normal. Right eye exhibits no discharge. Left eye exhibits no discharge. No scleral  icterus.  Cardiovascular: Normal rate and regular rhythm.   Pulmonary/Chest: Effort normal and breath sounds normal. No respiratory distress.  Musculoskeletal: Normal range of motion.  Neurological: She is alert and oriented to person, place, and time.  Skin: Skin is intact. No rash noted.  Psychiatric: She has a normal mood and affect. Her speech is normal and behavior is normal. Judgment and thought content normal. Cognition and memory are normal.    Results for orders placed or performed during the hospital encounter of 12/08/15  CBC  Result Value Ref Range   WBC 12.3 (H) 3.6 - 11.0 K/uL   RBC 4.40 3.80 - 5.20 MIL/uL   Hemoglobin 13.5 12.0 - 16.0 g/dL   HCT 40.2 35.0 - 47.0 %   MCV 91.5 80.0 - 100.0 fL   MCH 30.7 26.0 - 34.0 pg   MCHC 33.6 32.0 - 36.0 g/dL   RDW 13.0 11.5 - 14.5 %   Platelets 135 (L) 150 - 440 K/uL  Troponin I  Result Value Ref Range   Troponin I <0.03 <0.031 ng/mL  Comprehensive metabolic panel  Result Value Ref Range   Sodium 136 135 - 145 mmol/L   Potassium 3.7 3.5 - 5.1 mmol/L   Chloride 108 101 - 111 mmol/L   CO2 22 22 - 32 mmol/L   Glucose, Bld 101 (H) 65 - 99 mg/dL   BUN 17 6 - 20 mg/dL   Creatinine, Ser 0.72 0.44 -  1.00 mg/dL   Calcium 8.8 (L) 8.9 - 10.3 mg/dL   Total Protein 6.6 6.5 - 8.1 g/dL   Albumin 3.9 3.5 - 5.0 g/dL   AST 28 15 - 41 U/L   ALT 21 14 - 54 U/L   Alkaline Phosphatase 61 38 - 126 U/L   Total Bilirubin 1.3 (H) 0.3 - 1.2 mg/dL   GFR calc non Af Amer >60 >60 mL/min   GFR calc Af Amer >60 >60 mL/min   Anion gap 6 5 - 15      Assessment & Plan:   Problem List Items Addressed This Visit    None    Visit Diagnoses    Acute sinusitis, recurrence not specified, unspecified location    -  Primary    Discussed sinusitis care and treatment will continue clindamycin not change in antibiotics add Mucinex, nasal rinse, Nasacort,    Relevant Medications    clindamycin (CLEOCIN) 300 MG capsule    triamcinolone (NASACORT) 55 MCG/ACT  AERO nasal inhaler     for sinusitis was given a give levofloxacin due to patient's allergies but drug interaction with clindamycin and patient's complaints of irregular heartbeats stopped use of that medication. It was originally understood the patient had been on clindamycin for 5 days with no response.   Follow up plan: Return for As scheduled if not better will need ENT referral.

## 2016-02-19 ENCOUNTER — Encounter: Payer: Self-pay | Admitting: Family Medicine

## 2016-02-19 ENCOUNTER — Ambulatory Visit (INDEPENDENT_AMBULATORY_CARE_PROVIDER_SITE_OTHER): Payer: Medicaid Other | Admitting: Family Medicine

## 2016-02-19 VITALS — BP 106/72 | HR 82 | Temp 98.4°F | Wt 138.0 lb

## 2016-02-19 DIAGNOSIS — M25562 Pain in left knee: Secondary | ICD-10-CM | POA: Diagnosis not present

## 2016-02-19 DIAGNOSIS — G43109 Migraine with aura, not intractable, without status migrainosus: Secondary | ICD-10-CM | POA: Diagnosis not present

## 2016-02-19 MED ORDER — KETOROLAC TROMETHAMINE 60 MG/2ML IM SOLN
60.0000 mg | Freq: Once | INTRAMUSCULAR | Status: AC
  Administered 2016-02-19: 60 mg via INTRAMUSCULAR

## 2016-02-19 NOTE — Patient Instructions (Signed)
Someone should contact you in the next few days regarding your two referrals. Continue ibuprofen and tylenol until then to help with pain.

## 2016-02-19 NOTE — Progress Notes (Signed)
BP 106/72 mmHg  Pulse 82  Temp(Src) 98.4 F (36.9 C)  Wt 138 lb (62.596 kg)  SpO2 97%   Subjective:    Patient ID: Ellen Jackson, female    DOB: May 09, 1991, 25 y.o.   MRN: [id number]  HPI: Ellen Jackson is a 25 y.o. female  Chief Complaint  Patient presents with  . Knee Pain    left x 3-4 days, no known injury but wonders if she maybe twisted it during her sleep.  . Migraine    has one today. She has had this one for 4-5 days and it comes and goes. She usually takes OTC tylenol, advil, BC Powders but it's not knocking this one out.    Left knee pain and swelling lateral and anterior x 3-4 days. No known injury. Previous patellar dislocation in 2000 with major reconstructive surgery last year with an orthopedist in [place]. No issues since recovery up until now. Has been trying ibuprofen, BC powder, tylenol with no relief. Pain radiates down to toes, with occasional numbness and tingling. Able to bear weight with moderate pain. Denies fever, chills, or redness/warmth in joint.   Has chronic migraines since age 70. Has had a severe migraine off and on for almost a week - 6/10 today. Seen neurology in the past, given medications but did not tolerate them well. Has seen HA specialist since then and did not feel like that helped much either. N/V, light and sound sensitivity. Frequency is becoming disruptive to daily activities at this point and intolerable per patient.   Relevant past medical, surgical, family and social history reviewed and updated as indicated. Interim medical history since our last visit reviewed. Allergies and medications reviewed and updated.  Review of Systems  Constitutional: Negative.   HENT:       Photophobia, phonophobia   Eyes: Positive for photophobia.  Respiratory: Negative.   Cardiovascular: Negative.   Gastrointestinal: Positive for nausea and vomiting.  Musculoskeletal: Positive for joint swelling (left knee), arthralgias (left knee) and gait  problem.  Skin: Negative.   Neurological: Positive for headaches. Negative for weakness.  Psychiatric/Behavioral: Negative.     Per HPI unless specifically indicated above     Objective:    BP 106/72 mmHg  Pulse 82  Temp(Src) 98.4 F (36.9 C)  Wt 138 lb (62.596 kg)  SpO2 97%  Wt Readings from Last 3 Encounters:  02/19/16 138 lb (62.596 kg)  01/13/16 140 lb (63.504 kg)  12/08/15 127 lb (57.607 kg)    Physical Exam  Constitutional: She is oriented to person, place, and time. She appears well-developed and well-nourished.  Eyes: Conjunctivae are normal. Pupils are equal, round, and reactive to light. No scleral icterus.  Neck: Neck supple.  Cardiovascular: Normal rate, regular rhythm, normal heart sounds and intact distal pulses.   Pulmonary/Chest: Effort normal and breath sounds normal. No respiratory distress.  Abdominal: Soft. Bowel sounds are normal.  Musculoskeletal: She exhibits edema (left knee with moderate lateral and anterior edema) and tenderness (TTP over lateral and anterior aspects of joint).  Antalgic gait. Full ROM intact No redness or warmth in joint.  Neurological: She is alert and oriented to person, place, and time. No cranial nerve deficit.  Skin: Skin is warm and dry.  Psychiatric: She has a normal mood and affect. Her behavior is normal.      Assessment & Plan:   Problem List Items Addressed This Visit      Cardiovascular and Mediastinum   Headache,  migraine    Toradol given today. Referral back to Headache Clinic sent, patient ready to get on long term suppressive medications at this point.       Relevant Orders   AMB referral to headache clinic     Other   Gonalgia - Primary   Relevant Orders   Ambulatory referral to Orthopedic Surgery    Discussed with patient regarding immediate x-ray versus awaiting visit with her surgeon - patient agreeable to await specialist workup.  Discussed a stronger NSAID to help with her pain, patient will let  us know if she decides she wants to try this course.   Follow up plan: Return if symptoms worsen or fail to improve.

## 2016-02-19 NOTE — Assessment & Plan Note (Signed)
Toradol given today. Referral back to Headache Clinic sent, patient ready to get on long term suppressive medications at this point.

## 2016-02-28 ENCOUNTER — Other Ambulatory Visit: Payer: Self-pay | Admitting: Family Medicine

## 2016-02-28 ENCOUNTER — Ambulatory Visit: Payer: Medicaid Other

## 2016-02-28 DIAGNOSIS — Z3042 Encounter for surveillance of injectable contraceptive: Secondary | ICD-10-CM

## 2016-02-28 MED ORDER — MEDROXYPROGESTERONE ACETATE 150 MG/ML IM SUSP
150.0000 mg | INTRAMUSCULAR | Status: DC
  Administered 2016-03-13: 150 mg via INTRAMUSCULAR

## 2016-03-05 ENCOUNTER — Ambulatory Visit: Payer: Medicaid Other

## 2016-03-13 ENCOUNTER — Ambulatory Visit (INDEPENDENT_AMBULATORY_CARE_PROVIDER_SITE_OTHER): Payer: Medicaid Other

## 2016-03-13 DIAGNOSIS — Z3042 Encounter for surveillance of injectable contraceptive: Secondary | ICD-10-CM | POA: Diagnosis not present

## 2016-03-13 DIAGNOSIS — Z308 Encounter for other contraceptive management: Secondary | ICD-10-CM | POA: Diagnosis not present

## 2016-03-24 ENCOUNTER — Emergency Department: Payer: Medicaid Other

## 2016-03-24 ENCOUNTER — Emergency Department
Admission: EM | Admit: 2016-03-24 | Discharge: 2016-03-24 | Disposition: A | Discharge status: Home / Self Care | Payer: Medicaid Other | Attending: Emergency Medicine | Admitting: Emergency Medicine

## 2016-03-24 ENCOUNTER — Encounter: Payer: Self-pay | Admitting: Medical Oncology

## 2016-03-24 ENCOUNTER — Telehealth: Payer: Self-pay | Admitting: Family Medicine

## 2016-03-24 DIAGNOSIS — R51 Headache: Secondary | ICD-10-CM | POA: Diagnosis not present

## 2016-03-24 DIAGNOSIS — F17200 Nicotine dependence, unspecified, uncomplicated: Secondary | ICD-10-CM | POA: Insufficient documentation

## 2016-03-24 DIAGNOSIS — R42 Dizziness and giddiness: Secondary | ICD-10-CM | POA: Diagnosis present

## 2016-03-24 DIAGNOSIS — R519 Headache, unspecified: Secondary | ICD-10-CM

## 2016-03-24 LAB — URINALYSIS COMPLETE WITH MICROSCOPIC (ARMC ONLY)
BILIRUBIN URINE: NEGATIVE
Bacteria, UA: NONE SEEN
Glucose, UA: NEGATIVE mg/dL
Hgb urine dipstick: NEGATIVE
Ketones, ur: NEGATIVE mg/dL
Leukocytes, UA: NEGATIVE
Nitrite: NEGATIVE
PH: 8 (ref 5.0–8.0)
Protein, ur: NEGATIVE mg/dL
Specific Gravity, Urine: 1.016 (ref 1.005–1.030)

## 2016-03-24 LAB — BASIC METABOLIC PANEL
Anion gap: 8 (ref 5–15)
BUN: 15 mg/dL (ref 6–20)
CALCIUM: 9.1 mg/dL (ref 8.9–10.3)
CHLORIDE: 111 mmol/L (ref 101–111)
CO2: 20 mmol/L — AB (ref 22–32)
CREATININE: 0.69 mg/dL (ref 0.44–1.00)
GFR calc Af Amer: >60 mL/min (ref >60)
GFR calc non Af Amer: >60 mL/min (ref >60)
GLUCOSE: 93 mg/dL (ref 65–99)
Potassium: 3.4 mmol/L — ABNORMAL LOW (ref 3.5–5.1)
Sodium: 139 mmol/L (ref 135–145)

## 2016-03-24 LAB — CBC
HCT: 41.9 % (ref 35.0–47.0)
Hemoglobin: 14.4 g/dL (ref 12.0–16.0)
MCH: 31.3 pg (ref 26.0–34.0)
MCHC: 34.4 g/dL (ref 32.0–36.0)
MCV: 91.1 fL (ref 80.0–100.0)
PLATELETS: 144 10*3/uL — AB (ref 150–440)
RBC: 4.6 MIL/uL (ref 3.80–5.20)
RDW: 12.7 % (ref 11.5–14.5)
WBC: 7.8 10*3/uL (ref 3.6–11.0)

## 2016-03-24 LAB — POCT PREGNANCY, URINE: Preg Test, Ur: NEGATIVE

## 2016-03-24 MED ORDER — SODIUM CHLORIDE 0.9 % IV BOLUS (SEPSIS)
1000.0000 mL | Freq: Once | INTRAVENOUS | Status: AC
  Administered 2016-03-24: 1000 mL via INTRAVENOUS

## 2016-03-24 MED ORDER — DEXAMETHASONE SODIUM PHOSPHATE 10 MG/ML IJ SOLN
10.0000 mg | Freq: Once | INTRAMUSCULAR | Status: AC
  Administered 2016-03-24: 10 mg via INTRAVENOUS
  Filled 2016-03-24: qty 1

## 2016-03-24 MED ORDER — DIPHENHYDRAMINE HCL 50 MG/ML IJ SOLN
25.0000 mg | Freq: Once | INTRAMUSCULAR | Status: AC
  Administered 2016-03-24: 25 mg via INTRAVENOUS
  Filled 2016-03-24: qty 1

## 2016-03-24 NOTE — Discharge Instructions (Signed)
Follow up with your primary care doctor at [facility] family practice if any continued problems. Continue to drink fluids often.

## 2016-03-24 NOTE — ED Triage Notes (Signed)
Pt reports that she has been having neck/headache x 3-4 days, since last night around 1215 pt began having dizziness with head movement and nausea. Pt has hx of migraine headache.

## 2016-03-24 NOTE — ED Notes (Signed)
UA Preg results = NEG 

## 2016-03-24 NOTE — ED Provider Notes (Signed)
[facility] Emergency Department Provider Note   ____________________________________________   None    (approximate)  I have reviewed the triage vital signs and the nursing notes.   HISTORY  Chief Complaint Migraine; Nausea; and Dizziness    HPI Ellen Jackson is a 25 y.o. female history complaint of headache for 1 day.Patient states she believes that the headache began at the back of her neck and gradually became worse. Patient states that since last night she has had some continued headache along with nausea and dizziness. Patient states she has a history of migraine headaches but does not have any medication that she takes for migraines. Patient has some nausea but no vomiting. Patient denies any photophobia. She states that she had a CT of her head years ago which was negative. She states her headache is "almost like the one she used to have". She explained that the difference is that the onset was somewhat different than her normal headaches. Currently she rates her pain as 9/10. She states that pain is increased with movement of her head. It is unrelieved by nothing so far. Patient also has multiple drug sensitivities and has not taken anything over-the-counter medication.   Past Medical History:  Diagnosis Date  . Allergy   . Hernia, umbilical   . IBS (irritable bowel syndrome)   . Migraine with aura   . Ovarian cyst   . Spina bifida occulta   . SVT (supraventricular tachycardia) [facility])     Patient Active Problem List   Diagnosis Date Noted  . Anxiety 02/05/2015  . Headache, migraine 02/05/2015  . Clinical depression 02/05/2015  . 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 Paroxysmal supraventricular tachycardia ([place]) 02/05/2015  . LLQ abdominal pain 02/05/2015  . Gonalgia 10/16/2014    Past Surgical History:  Procedure Laterality Date  . APPENDECTOMY    . CESAREAN SECTION  2011    . CHOLECYSTECTOMY    . HERNIA REPAIR    . KNEE ARTHROSCOPY     X 3    Prior to Admission medications   Medication Sig Start Date End Date Taking? Authorizing Provider  citalopram (CELEXA) 40 MG tablet Take 40 mg by mouth daily. 11/05/15   Historical Provider, MD  LORazepam (ATIVAN) 1 MG tablet Take 1 tablet (1 mg total) by mouth every 8 (eight) hours as needed (for nausea / vomiting). 12/08/15   Daymon Larsen, MD  medroxyPROGESTERone (DEPO-PROVERA) 150 MG/ML injection Inject 150 mg into the muscle every 3 (three) months.    Historical Provider, MD  metoprolol succinate (TOPROL-XL) 25 MG 24 hr tablet Take 1 tablet (25 mg total) by mouth daily. 11/21/15   Megan P Johnson, DO  PROAIR HFA 108 (90 Base) MCG/ACT inhaler INHALE 1 TO 2 PUFFS PO EVERY 4 TO 6 H AS NEEDED 01/09/16   Historical Provider, MD  ranitidine (ZANTAC) 150 MG tablet Take 150 mg by mouth daily as needed.  05/09/15 11/05/15  Historical Provider, MD    Allergies Codeine; Morphine; Nalbuphine; Ondansetron hcl; Other; Penicillins; Latex; Metoclopramide; Prochlorperazine; and Promethazine  Family History  Problem Relation Age of Onset  . Arthritis Mother   . Hyperlipidemia Mother   . Hypertension Mother   . Migraines Mother   . Arthritis Father   . Autism Son   . Cancer Maternal Grandfather     pancreatic  . Stroke Maternal Grandfather   . Diabetes Paternal Grandmother     Social  History Social History  Substance Use Topics  . Smoking status: Current Every Day Smoker    Packs/day: 0.50    Years: 3.00  . Smokeless tobacco: Never Used  . Alcohol use No    Review of Systems Constitutional: No fever/chills. Positive dizziness. Eyes: No visual changes. Negative photophobia. ENT: No sore throat. Cardiovascular: Denies chest pain. Respiratory: Denies shortness of breath. Gastrointestinal:   Positive nausea, no vomiting.   Musculoskeletal: Negative for back pain. Positive for neck pain. Skin: Negative for  rash. Neurological: Positive for headaches, focal weakness or numbness.  10-point ROS otherwise negative.  ____________________________________________   PHYSICAL EXAM:  VITAL SIGNS: ED Triage Vitals [03/24/16 1159]  Enc Vitals Group     BP 116/80     Pulse Rate 70     Resp 16     Temp 97.7 F (36.5 C)     Temp Source Oral     SpO2 100 %     Weight 135 lb (61.2 kg)     Height 5\' 7"  (1.702 m)     Head Circumference      Peak Flow      Pain Score 9     Pain Loc      Pain Edu?      Excl. in [place]?     Constitutional: Alert and oriented. Well appearing and in no acute distress.Patient is sitting in a darkened room. Eyes: Conjunctivae are normal. PERRL. EOMI. no nystagmus was noted. Head: Atraumatic. Nose: No congestion/rhinnorhea.  EACs and TMs are clear bilaterally. Mouth/Throat: Mucous membranes are moist.  Oropharynx non-erythematous. Neck: No stridor. Cervical spine nontender to palpation posteriorly. There is some cervical muscle tenderness present. Range of motion is minimally restricted in the lateral movement. Patient is able to flex and extend without any difficulty. Hematological/Lymphatic/Immunilogical: No cervical lymphadenopathy. Cardiovascular: Normal rate, regular rhythm. Grossly normal heart sounds.  Good peripheral circulation. Respiratory: Normal respiratory effort.  No retractions. Lungs CTAB. Musculoskeletal: Moves upper and lower extremities without any difficulty. Normal gait was noted. Neurologic:  Normal speech and language. No gross focal neurologic deficits are appreciated. Cranial nerves II through XII grossly intact. Lower extremities reflexes bilaterally were 2+. No gait instability. Skin:  Skin is warm, dry and intact. No rash noted. Psychiatric: Mood and affect are normal. Speech and behavior are normal.  ____________________________________________   LABS (all labs ordered are listed, but only abnormal results are displayed)  Labs Reviewed   BASIC METABOLIC PANEL - Abnormal; Notable for the following:       Result Value   Potassium 3.4 (*)    CO2 20 (*)    All other components within normal limits  CBC - Abnormal; Notable for the following:    Platelets 144 (*)    All other components within normal limits  URINALYSIS COMPLETEWITH MICROSCOPIC ([facility] ONLY) - Abnormal; Notable for the following:    Color, Urine YELLOW (*)    APPearance CLEAR (*)    Squamous Epithelial / LPF 0-5 (*)    All other components within normal limits  POC URINE PREG, ED  POCT PREGNANCY, URINE     RADIOLOGY  CT scan per radiologist was negative. ____________________________________________   PROCEDURES  Procedure(s) performed: None  Procedures  Critical Care performed: No  ____________________________________________   INITIAL IMPRESSION / ASSESSMENT AND PLAN / ED COURSE  Pertinent labs & imaging results that were available during my care of the patient were reviewed by me and considered in my medical decision making (see chart  for details).    Clinical Course  Patient was given a liter normal saline along with Benadryl 25 mg IV and Decadron 10 mg IV. Patient was informed that her CT was negative. Patient voices that her headache has improved and that she is no longer nauseous. Patient is to follow-up with her primary care doctor Dr. Mariann Barter office. No other medications was given.   ____________________________________________   FINAL CLINICAL IMPRESSION(S) / ED DIAGNOSES  Final diagnoses:  Acute nonintractable headache, unspecified headache type      NEW MEDICATIONS STARTED DURING THIS VISIT:  Discharge Medication List as of 03/24/2016  4:17 PM       Note:  This document was prepared using Dragon voice recognition software and may include unintentional dictation errors.    Johnn Hai, PA-C 03/24/16 Shelbina Quigley, MD 03/25/16 [phone]

## 2016-03-24 NOTE — Telephone Encounter (Signed)
FYI- Pt called and stated that she had been dizzy and nauseated since 12am and she wanted to know whether she should come in to the office or go to urgent care. After finding that there were no appointments available I spoke with Tiffany and suggested the pt go to urgent care. The pt stated that urgent care was further than the hospital so she would have someone take her to [facility] ED.

## 2016-03-26 ENCOUNTER — Ambulatory Visit (INDEPENDENT_AMBULATORY_CARE_PROVIDER_SITE_OTHER): Payer: Medicaid Other | Admitting: Family Medicine

## 2016-03-26 ENCOUNTER — Encounter: Payer: Self-pay | Admitting: Family Medicine

## 2016-03-26 ENCOUNTER — Other Ambulatory Visit: Payer: Self-pay | Admitting: Family Medicine

## 2016-03-26 VITALS — BP 111/76 | HR 73 | Temp 98.7°F | Wt 138.0 lb

## 2016-03-26 DIAGNOSIS — G43909 Migraine, unspecified, not intractable, without status migrainosus: Secondary | ICD-10-CM | POA: Diagnosis not present

## 2016-03-26 DIAGNOSIS — R42 Dizziness and giddiness: Secondary | ICD-10-CM

## 2016-03-26 MED ORDER — KETOROLAC TROMETHAMINE 60 MG/2ML IM SOLN
60.0000 mg | Freq: Once | INTRAMUSCULAR | Status: AC
  Administered 2016-03-26: 60 mg via INTRAMUSCULAR

## 2016-03-26 MED ORDER — FLUTICASONE PROPIONATE 50 MCG/ACT NA SUSP: 2.0000 | Freq: Every day | NASAL | 6 refills | Status: DC

## 2016-03-26 NOTE — Progress Notes (Signed)
BP 111/76   Pulse 73   Temp 98.7 F (37.1 C)   Wt 138 lb (62.6 kg)   SpO2 99%   BMI 21.61 kg/m    Subjective:    Patient ID: Ellen Jackson, female    DOB: 22-Jan-1991, 25 y.o.   MRN: [id number]  HPI: Ellen Jackson is a 25 y.o. female  Chief Complaint  Patient presents with  . Dizziness    since late Monday night. Went to the ED on Tues. States it is actually a little worse now than it was then. Now also has a right sided headache and right ear feels congested.    Patient presents for ER f/u from visit Tuesday for migraine and dizziness. Head CT negative. Given benadryl and decadron with good relief and d/c home. Still having constant dizziness for about a week, severe R sided migraine, right ear fullness, ringing, and pain. Denies nasal congestion or allergy symptoms. No fever or neck pain. Has not tried anything OTC. Is feeling nauseous but no vomiting. Bending down makes dizziness worse, as does putting chin down to chest.   Relevant past medical, surgical, family and social history reviewed and updated as indicated. Interim medical history since our last visit reviewed. Allergies and medications reviewed and updated.  Review of Systems  Constitutional: Negative.   HENT: Positive for ear pain and tinnitus.   Eyes: Negative.   Respiratory: Negative.   Cardiovascular: Negative.   Gastrointestinal: Positive for nausea.  Genitourinary: Negative.   Musculoskeletal: Negative.   Neurological: Positive for dizziness and headaches.  Psychiatric/Behavioral: Negative.     Per HPI unless specifically indicated above     Objective:    BP 111/76   Pulse 73   Temp 98.7 F (37.1 C)   Wt 138 lb (62.6 kg)   SpO2 99%   BMI 21.61 kg/m   Wt Readings from Last 3 Encounters:  03/26/16 138 lb (62.6 kg)  03/24/16 135 lb (61.2 kg)  02/19/16 138 lb (62.6 kg)    Physical Exam  Constitutional: She is oriented to person, place, and time. She appears well-developed and well-nourished.    HENT:  Head: Atraumatic.  Eyes: Conjunctivae are normal. No scleral icterus.  Neck: Normal range of motion. Neck supple.  Cardiovascular: Normal rate and normal heart sounds.   Pulmonary/Chest: Effort normal. No respiratory distress.  Abdominal: Soft. Bowel sounds are normal.  Musculoskeletal: Normal range of motion.  Lymphadenopathy:    She has no cervical adenopathy.  Neurological: She is alert and oriented to person, place, and time. No cranial nerve deficit.  Skin: Skin is warm and dry.  Psychiatric: She has a normal mood and affect. Her behavior is normal.  Nursing note and vitals reviewed.     Assessment & Plan:   Problem List Items Addressed This Visit      Cardiovascular and Mediastinum   Headache, migraine   Relevant Medications   ketorolac (TORADOL) injection 60 mg (Completed)    Other Visit Diagnoses    Dizziness    -  Primary    IM Toradol given today for headache. Recommended she take benadryl and ibuprofen for symptoms at home until she can get into the Headache Clinic.   Dizziness likely from vertigo, patient declined meclizine and will just try the flonase for her inner ear symptoms to see if that helps give her relief. She will let me know in a few days if she is improving.    Follow up plan: Return if  symptoms worsen or fail to improve.

## 2016-03-26 NOTE — Patient Instructions (Signed)
Follow up as needed

## 2016-04-25 ENCOUNTER — Encounter: Payer: Self-pay | Admitting: Emergency Medicine

## 2016-04-25 DIAGNOSIS — Z202 Contact with and (suspected) exposure to infections with a predominantly sexual mode of transmission: Secondary | ICD-10-CM | POA: Insufficient documentation

## 2016-04-25 DIAGNOSIS — R102 Pelvic and perineal pain: Secondary | ICD-10-CM | POA: Insufficient documentation

## 2016-04-25 DIAGNOSIS — F17200 Nicotine dependence, unspecified, uncomplicated: Secondary | ICD-10-CM | POA: Diagnosis not present

## 2016-04-25 NOTE — ED Triage Notes (Signed)
Patient states that she had unprotected sex on Saturday and then on Tuesday started having vaginal itching and burning.

## 2016-04-26 ENCOUNTER — Emergency Department
Admission: EM | Admit: 2016-04-26 | Discharge: 2016-04-26 | Disposition: A | Discharge status: Home / Self Care | Payer: Medicaid Other | Attending: Emergency Medicine | Admitting: Emergency Medicine

## 2016-04-26 DIAGNOSIS — Z202 Contact with and (suspected) exposure to infections with a predominantly sexual mode of transmission: Secondary | ICD-10-CM

## 2016-04-26 DIAGNOSIS — R102 Pelvic and perineal pain: Secondary | ICD-10-CM

## 2016-04-26 LAB — WET PREP, GENITAL
CLUE CELLS WET PREP: NONE SEEN
SPERM: NONE SEEN
TRICH WET PREP: NONE SEEN
YEAST WET PREP: NONE SEEN

## 2016-04-26 LAB — CHLAMYDIA/NGC RT PCR (ARMC ONLY)
CHLAMYDIA TR: NOT DETECTED
N GONORRHOEAE: NOT DETECTED

## 2016-04-26 LAB — URINALYSIS COMPLETE WITH MICROSCOPIC (ARMC ONLY)
BACTERIA UA: NONE SEEN
BILIRUBIN URINE: NEGATIVE
GLUCOSE, UA: NEGATIVE mg/dL
Hgb urine dipstick: NEGATIVE
Ketones, ur: NEGATIVE mg/dL
Nitrite: NEGATIVE
Protein, ur: NEGATIVE mg/dL
Specific Gravity, Urine: 1.021 (ref 1.005–1.030)
pH: 5 (ref 5.0–8.0)

## 2016-04-26 LAB — POC URINE PREG, ED: PREG TEST UR: NEGATIVE

## 2016-04-26 MED ORDER — DOXYCYCLINE HYCLATE 100 MG PO TABS: 100.0000 mg | ORAL_TABLET | Freq: Two times a day (BID) | ORAL | 0 refills | Status: DC

## 2016-04-26 NOTE — ED Provider Notes (Signed)
[facility] Emergency Department Provider Note  ____________________________________________  Time seen: Approximately 12:35 AM  I have reviewed the triage vital signs and the nursing notes.   HISTORY  Chief Complaint Vaginal Pain    HPI Ellen LIUZZI is a 25 y.o. female who presents emergency department complaining of vaginal discomfort 4 days. Patient states that she had unprotected sex a week ago. 3 days later, she developed some vaginal irritation and then drainage. Patient denies any abdominal pain, diarrhea or constipation, dysuria, polyuria, hematuria. Patient has not tried any medications for same. Patient did report nausea and vomiting times one day that has resolved.   Past Medical History:  Diagnosis Date  . Allergy   . Hernia, umbilical   . IBS (irritable bowel syndrome)   . Migraine with aura   . Ovarian cyst   . Spina bifida occulta   . SVT (supraventricular tachycardia) [facility])     Patient Active Problem List   Diagnosis Date Noted  . Anxiety 02/05/2015  . Headache, migraine 02/05/2015  . Clinical depression 02/05/2015  . 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 Paroxysmal supraventricular tachycardia ([place]) 02/05/2015  . LLQ abdominal pain 02/05/2015  . Gonalgia 10/16/2014    Past Surgical History:  Procedure Laterality Date  . APPENDECTOMY    . CESAREAN SECTION  2011  . CHOLECYSTECTOMY    . HERNIA REPAIR    . KNEE ARTHROSCOPY     X 3    Prior to Admission medications   Medication Sig Start Date End Date Taking? Authorizing Provider  citalopram (CELEXA) 40 MG tablet Take 40 mg by mouth daily. 11/05/15   Historical Provider, MD  doxycycline (VIBRA-TABS) 100 MG tablet Take 1 tablet (100 mg total) by mouth 2 (two) times daily. 04/26/16   Charline Bills Tatum Massman, PA-C  fluticasone (FLONASE) 50 MCG/ACT nasal spray Place 2 sprays into both nostrils daily. 03/26/16   Megan P  Johnson, DO  LORazepam (ATIVAN) 1 MG tablet Take 1 tablet (1 mg total) by mouth every 8 (eight) hours as needed (for nausea / vomiting). 12/08/15   Daymon Larsen, MD  medroxyPROGESTERone (DEPO-PROVERA) 150 MG/ML injection Inject 150 mg into the muscle every 3 (three) months.    Historical Provider, MD  metoprolol succinate (TOPROL-XL) 25 MG 24 hr tablet Take 1 tablet (25 mg total) by mouth daily. 11/21/15   Megan P Johnson, DO  PROAIR HFA 108 (90 Base) MCG/ACT inhaler INHALE 1 TO 2 PUFFS PO EVERY 4 TO 6 H AS NEEDED 01/09/16   Historical Provider, MD  ranitidine (ZANTAC) 150 MG tablet Take 150 mg by mouth daily as needed.  05/09/15 11/05/15  Historical Provider, MD    Allergies Codeine; Morphine; Nalbuphine; Ondansetron hcl; Other; Penicillins; Tizanidine; Latex; Metoclopramide; Prochlorperazine; and Promethazine  Family History  Problem Relation Age of Onset  . Arthritis Mother   . Hyperlipidemia Mother   . Hypertension Mother   . Migraines Mother   . Arthritis Father   . Autism Son   . Cancer Maternal Grandfather     pancreatic  . Stroke Maternal Grandfather   . Diabetes Paternal Grandmother     Social History Social History  Substance Use Topics  . Smoking status: Current Every Day Smoker    Packs/day: 0.50    Years: 3.00  . Smokeless tobacco: Never Used  . Alcohol use No     Review of Systems  Constitutional: No fever/chills Eyes: No visual changes.  No discharge ENT: No upper respiratory complaints. Cardiovascular: no chest pain. Respiratory: no cough. No SOB. Gastrointestinal: No abdominal pain.  No nausea, no vomiting.  No diarrhea.  No constipation. Genitourinary: Negative for dysuria. No hematuria. Positive for vaginal itching, burning, thin discharge Musculoskeletal: Negative for musculoskeletal pain. Skin: Negative for rash, abrasions, lacerations, ecchymosis. Neurological: Negative for headaches, focal weakness or numbness. 10-point ROS otherwise  negative.  ____________________________________________   PHYSICAL EXAM:  VITAL SIGNS: ED Triage Vitals  Enc Vitals Group     BP 04/25/16 2332 114/71     Pulse Rate 04/25/16 2332 70     Resp 04/25/16 2332 18     Temp 04/25/16 2332 98.2 F (36.8 C)     Temp Source 04/25/16 2332 Oral     SpO2 04/25/16 2332 100 %     Weight 04/25/16 2332 132 lb (59.9 kg)     Height 04/25/16 2332 5\' 7"  (1.702 m)     Head Circumference --      Peak Flow --      Pain Score 04/25/16 2333 7     Pain Loc --      Pain Edu? --      Excl. in [place]? --      Constitutional: Alert and oriented. Well appearing and in no acute distress. Eyes: Conjunctivae are normal. PERRL. EOMI. Head: Atraumatic. Cardiovascular: Normal rate, regular rhythm. Normal S1 and S2.  Good peripheral circulation. Respiratory: Normal respiratory effort without tachypnea or retractions. Lungs CTAB. Good air entry to the bases with no decreased or absent breath sounds. Gastrointestinal: Bowel sounds 4 quadrants. Soft and nontender to palpation. No guarding or rigidity. No palpable masses. No distention. No CVA tenderness. Genitourinary: No external lesions or chancres identified. No visible drainage. Pelvic exam is performed with chaperone. After insertion of speculum, thin, white discharge is identified around cervix. Cervix is non-erythematous or edematous. Bimanual exam reveals some discomfort along the vaginal wall but no cervical motion tenderness. No palpable abnormality of the adnexa or uterus. Musculoskeletal: Full range of motion to all extremities. No gross deformities appreciated. Neurologic:  Normal speech and language. No gross focal neurologic deficits are appreciated.  Skin:  Skin is warm, dry and intact. No rash noted. Psychiatric: Mood and affect are normal. Speech and behavior are normal. Patient exhibits appropriate insight and judgement.  Pelvic exam is performed with chaperone, Lovena Le,  RN. ____________________________________________   LABS (all labs ordered are listed, but only abnormal results are displayed)  Labs Reviewed  WET PREP, GENITAL - Abnormal; Notable for the following:       Result Value   WBC, Wet Prep HPF POC FEW (*)    All other components within normal limits  URINALYSIS COMPLETEWITH MICROSCOPIC ([facility] ONLY) - Abnormal; Notable for the following:    Color, Urine YELLOW (*)    APPearance CLEAR (*)    Leukocytes, UA 1+ (*)    Squamous Epithelial / LPF 0-5 (*)    All other components within normal limits  CHLAMYDIA/NGC RT PCR ([facility] ONLY)  POC URINE PREG, ED   ____________________________________________  EKG   ____________________________________________  RADIOLOGY   No results found.  ____________________________________________    PROCEDURES  Procedure(s) performed:    Procedures    Medications - No data to display   ____________________________________________   INITIAL IMPRESSION / ASSESSMENT AND PLAN / ED COURSE  Pertinent labs & imaging results that were available during my care of the patient were reviewed by me and considered in my medical  decision making (see chart for details).  Review of the [place] CSRS was performed in accordance of the [place] prior to dispensing any controlled drugs.  Clinical Course    Patient's diagnosis is consistent with Possible STD exposure. Exam reveals mild tenderness along the vaginal wall with exam but no cervical motion tenderness. Mild, thin discharge. Urinalysis is negative. Wet prep returns with no evidence of trichomoniasis, BV, yeast infection. Pregnancy test is negative.  Gonorrhea and chlamydia testing has not returned at time of discharge. We will treat patient for STDs With doxycycline. Patient is highly allergic to penicillins and states that Zithromax gives her SVT palpitations. As such, we will treat with doxycycline which provides coverage for both chlamydia and  gonorrhea.. Should the patient's results return positive, we will call patient with results. If they should return positive, patient will follow-up with health Department in 3 weeks for repeat testing to ensure clearance. At that time, patient may pursue further STD testing if so desired.. Patient to take Tylenol and Motrin at home as needed for symptom control. Patient will follow-up with PCP or OB/GYN is tender. Patient is given ED precautions to return to the ED for any worsening or new symptoms.     ____________________________________________  FINAL CLINICAL IMPRESSION(S) / ED DIAGNOSES  Final diagnoses:  Vaginal pain  Possible exposure to STD      NEW MEDICATIONS STARTED DURING THIS VISIT:  New Prescriptions   DOXYCYCLINE (VIBRA-TABS) 100 MG TABLET    Take 1 tablet (100 mg total) by mouth 2 (two) times daily.        This chart was dictated using voice recognition software/Dragon. Despite best efforts to proofread, errors can occur which can change the meaning. Any change was purely unintentional.    Darletta Moll, PA-C 04/26/16 0210    Loney Hering, MD 04/27/16 [phone]

## 2016-04-29 ENCOUNTER — Ambulatory Visit: Payer: Medicaid Other

## 2016-05-13 ENCOUNTER — Ambulatory Visit: Payer: Medicaid Other

## 2016-05-28 ENCOUNTER — Ambulatory Visit (INDEPENDENT_AMBULATORY_CARE_PROVIDER_SITE_OTHER): Payer: Medicaid Other | Admitting: Family Medicine

## 2016-05-28 ENCOUNTER — Encounter: Payer: Self-pay | Admitting: Family Medicine

## 2016-05-28 VITALS — BP 109/72 | HR 74 | Temp 98.5°F | Wt 138.9 lb

## 2016-05-28 DIAGNOSIS — F419 Anxiety disorder, unspecified: Secondary | ICD-10-CM

## 2016-05-28 DIAGNOSIS — Z23 Encounter for immunization: Secondary | ICD-10-CM

## 2016-05-28 DIAGNOSIS — M25562 Pain in left knee: Secondary | ICD-10-CM

## 2016-05-28 DIAGNOSIS — G8929 Other chronic pain: Secondary | ICD-10-CM | POA: Diagnosis not present

## 2016-05-28 MED ORDER — CITALOPRAM HYDROBROMIDE 40 MG PO TABS: 60.0000 mg | ORAL_TABLET | Freq: Every day | ORAL | 3 refills | Status: DC

## 2016-05-28 NOTE — Progress Notes (Signed)
BP 109/72 (BP Location: Left Arm, Patient Position: Sitting, Cuff Size: Normal)   Pulse 74   Temp 98.5 F (36.9 C)   Wt 138 lb 14.4 oz (63 kg)   SpO2 99%   BMI 21.75 kg/m    Subjective:    Patient ID: Ellen Jackson, female    DOB: [birth year], 25 y.o.   MRN: [id number]  HPI: Ellen Jackson is a 25 y.o. female  Chief Complaint  Patient presents with  . Anxiety    Patient would like a refill of counselors   ANXIETY/STRESS Duration: Past couple of months, worse rather than better Anxious mood: yes  Excessive worrying: yes Irritability: yes  Sweating: yes Nausea: yes Palpitations:no Hyperventilation: no Panic attacks: yes Agoraphobia: yes  Obscessions/compulsions: no Depressed mood: yes Depression screen PHQ 2/9 05/28/2016  Decreased Interest 1  Down, Depressed, Hopeless 2  PHQ - 2 Score 3  Altered sleeping 2  Tired, decreased energy 3  Change in appetite 2  Feeling bad or failure about yourself  2  Trouble concentrating 3  Moving slowly or fidgety/restless 2  Suicidal thoughts 0  PHQ-9 Score 17   GAD 7 : Generalized Anxiety Score 05/28/2016  Nervous, Anxious, on Edge 3  Control/stop worrying 2  Worry too much - different things 2  Trouble relaxing 3  Restless 2  Easily annoyed or irritable 3  Afraid - awful might happen 2  Total GAD 7 Score 17   Anhedonia: no Weight changes: no Insomnia: yes hard to stay asleep  Hypersomnia: yes Fatigue/loss of energy: yes Feelings of worthlessness: no Feelings of guilt: yes Impaired concentration/indecisiveness: yes Suicidal ideations: no  Crying spells: yes Recent Stressors/Life Changes: yes   Relationship problems: yes   Family stress: yes     Financial stress: yes    Job stress: yes    Recent death/loss: no  Relevant past medical, surgical, family and social history reviewed and updated as indicated. Interim medical history since our last visit reviewed. Allergies and medications reviewed and  updated.  Review of Systems  Constitutional: Negative.   Respiratory: Negative.   Cardiovascular: Negative.   Psychiatric/Behavioral: Positive for decreased concentration, dysphoric mood and sleep disturbance. Negative for agitation, behavioral problems, confusion, hallucinations, self-injury and suicidal ideas. The patient is nervous/anxious. The patient is not hyperactive.     Per HPI unless specifically indicated above     Objective:    BP 109/72 (BP Location: Left Arm, Patient Position: Sitting, Cuff Size: Normal)   Pulse 74   Temp 98.5 F (36.9 C)   Wt 138 lb 14.4 oz (63 kg)   SpO2 99%   BMI 21.75 kg/m   Wt Readings from Last 3 Encounters:  05/28/16 138 lb 14.4 oz (63 kg)  04/25/16 132 lb (59.9 kg)  03/26/16 138 lb (62.6 kg)    Physical Exam  Constitutional: She is oriented to person, place, and time. She appears well-developed and well-nourished. No distress.  HENT:  Head: Normocephalic and atraumatic.  Right Ear: Hearing normal.  Left Ear: Hearing normal.  Nose: Nose normal.  Eyes: Conjunctivae and lids are normal. Right eye exhibits no discharge. Left eye exhibits no discharge. No scleral icterus.  Pulmonary/Chest: Effort normal. No respiratory distress.  Musculoskeletal: Normal range of motion.  Neurological: She is alert and oriented to person, place, and time.  Skin: Skin is warm, dry and intact. No rash noted. No erythema. No pallor.  Psychiatric: She has a normal mood and affect. Her speech is normal  and behavior is normal. Judgment and thought content normal. Cognition and memory are normal.  Nursing note and vitals reviewed.   Results for orders placed or performed during the hospital encounter of 04/26/16  [place] rt PCR ([facility] only)  Result Value Ref Range   Specimen source GC/Chlam ENDOCERVICAL    Chlamydia Tr NOT DETECTED NOT DETECTED   N gonorrhoeae NOT DETECTED NOT DETECTED  Wet prep, genital  Result Value Ref Range   Yeast Wet Prep HPF POC  NONE SEEN NONE SEEN   Trich, Wet Prep NONE SEEN NONE SEEN   Clue Cells Wet Prep HPF POC NONE SEEN NONE SEEN   WBC, Wet Prep HPF POC FEW (A) NONE SEEN   Sperm NONE SEEN   Urinalysis complete, with microscopic ([facility] only)  Result Value Ref Range   Color, Urine YELLOW (A) YELLOW   APPearance CLEAR (A) CLEAR   Glucose, UA NEGATIVE NEGATIVE mg/dL   Bilirubin Urine NEGATIVE NEGATIVE   Ketones, ur NEGATIVE NEGATIVE mg/dL   Specific Gravity, Urine 1.021 1.005 - 1.030   Hgb urine dipstick NEGATIVE NEGATIVE   pH 5.0 5.0 - 8.0   Protein, ur NEGATIVE NEGATIVE mg/dL   Nitrite NEGATIVE NEGATIVE   Leukocytes, UA 1+ (A) NEGATIVE   RBC / HPF 0-5 0 - 5 RBC/hpf   WBC, UA 0-5 0 - 5 WBC/hpf   Bacteria, UA NONE SEEN NONE SEEN   Squamous Epithelial / LPF 0-5 (A) NONE SEEN   Mucous PRESENT   POC Urine Pregnancy, ED  Result Value Ref Range   Preg Test, Ur Negative Negative      Assessment & Plan:   Problem List Items Addressed This Visit      Other   Anxiety - Primary    Not under good control. List of counselors given to patient today. She will call and set up an appointment. If she needs full referral will let us know when she knows who she's seeing. Will increase her citalopram to 60mg  daily and will recheck in 1 month to see how she's doing. Continue to monitor.       Relevant Medications   citalopram (CELEXA) 40 MG tablet   Gonalgia   Relevant Orders   Ambulatory referral to Orthopedic Surgery    Other Visit Diagnoses    Immunization due       Flu shot given today.   Relevant Orders   Flu Vaccine QUAD 36+ mos PF IM (Fluarix & Fluzone Quad PF) (Completed)       Follow up plan: Return in about 4 weeks (around 06/25/2016) for PE.

## 2016-05-28 NOTE — Assessment & Plan Note (Signed)
Not under good control. List of counselors given to patient today. She will call and set up an appointment. If she needs full referral will let us know when she knows who she's seeing. Will increase her citalopram to 60mg  daily and will recheck in 1 month to see how she's doing. Continue to monitor.

## 2016-05-28 NOTE — Patient Instructions (Addendum)

## 2016-06-01 ENCOUNTER — Encounter: Payer: Self-pay | Admitting: Emergency Medicine

## 2016-06-01 ENCOUNTER — Emergency Department
Admission: EM | Admit: 2016-06-01 | Discharge: 2016-06-01 | Disposition: A | Discharge status: Home / Self Care | Payer: Medicaid Other | Attending: Emergency Medicine | Admitting: Emergency Medicine

## 2016-06-01 ENCOUNTER — Emergency Department: Payer: Medicaid Other

## 2016-06-01 DIAGNOSIS — Z79899 Other long term (current) drug therapy: Secondary | ICD-10-CM | POA: Insufficient documentation

## 2016-06-01 DIAGNOSIS — M25562 Pain in left knee: Secondary | ICD-10-CM

## 2016-06-01 DIAGNOSIS — M25462 Effusion, left knee: Secondary | ICD-10-CM | POA: Diagnosis not present

## 2016-06-01 DIAGNOSIS — G8918 Other acute postprocedural pain: Secondary | ICD-10-CM | POA: Diagnosis present

## 2016-06-01 DIAGNOSIS — F17200 Nicotine dependence, unspecified, uncomplicated: Secondary | ICD-10-CM | POA: Insufficient documentation

## 2016-06-01 DIAGNOSIS — G8929 Other chronic pain: Secondary | ICD-10-CM

## 2016-06-01 DIAGNOSIS — Z91040 Latex allergy status: Secondary | ICD-10-CM | POA: Diagnosis not present

## 2016-06-01 MED ORDER — NAPROXEN 500 MG PO TABS: 500.0000 mg | ORAL_TABLET | Freq: Two times a day (BID) | ORAL | 0 refills | Status: DC

## 2016-06-01 NOTE — ED Provider Notes (Signed)
[facility] Emergency Department Provider Note  ____________________________________________  Time seen: Approximately 10:08 AM  I have reviewed the triage vital signs and the nursing notes.   HISTORY  Chief Complaint Knee Pain    HPI Ellen Jackson is a 25 y.o. female , NAD, presents to the emergency department with several day history of left knee pain and swelling. Patient states she has had chronic left knee pain since a surgical procedure earlier in the year completed by [facility] orthopedics. Has seen her primary care provider over the last couple months in regards to her left knee pain and has been given anti-inflammatories which do not seem to help recently. Denies any recent injuries, traumas or falls. Has not had any numbness, weakness, tingling. Notes that the pain can radiate above and below the knee at times. Pain increases with weightbearing. Has not noted any redness, abnormal warmth or skin sores to the area. Has had no lower leg or ankle pain, redness, swelling or warmth. Has not contacted her orthopedic provider at [facility] over the last couple of months as she has been waiting on her primary care provider to make the referral.   Past Medical History:  Diagnosis Date  . Allergy   . Hernia, umbilical   . IBS (irritable bowel syndrome)   . Migraine with aura   . Ovarian cyst   . Spina bifida occulta   . SVT (supraventricular tachycardia) [facility])     Patient Active Problem List   Diagnosis Date Noted  . Anxiety 02/05/2015  . Headache, migraine 02/05/2015  . Clinical depression 02/05/2015  . 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 Paroxysmal supraventricular tachycardia ([place]) 02/05/2015  . LLQ abdominal pain 02/05/2015  . Gonalgia 10/16/2014    Past Surgical History:  Procedure Laterality Date  . APPENDECTOMY    . CESAREAN SECTION  2011  . CHOLECYSTECTOMY    . HERNIA REPAIR    . KNEE  ARTHROSCOPY     X 3    Prior to Admission medications   Medication Sig Start Date End Date Taking? Authorizing Provider  citalopram (CELEXA) 40 MG tablet Take 1.5 tablets (60 mg total) by mouth daily. 05/28/16   Megan P Johnson, DO  fluticasone (FLONASE) 50 MCG/ACT nasal spray Place 2 sprays into both nostrils daily. 03/26/16   Megan P Johnson, DO  LORazepam (ATIVAN) 1 MG tablet Take 1 tablet (1 mg total) by mouth every 8 (eight) hours as needed (for nausea / vomiting). 12/08/15   Daymon Larsen, MD  medroxyPROGESTERone (DEPO-PROVERA) 150 MG/ML injection Inject 150 mg into the muscle every 3 (three) months.    Historical Provider, MD  metoprolol succinate (TOPROL XL) 25 MG 24 hr tablet Take 25 mg by mouth daily.    Historical Provider, MD  naproxen (NAPROSYN) 500 MG tablet Take 1 tablet (500 mg total) by mouth 2 (two) times daily with a meal. 06/01/16   Keviana Guida L Patsie Mccardle, PA-C  ranitidine (ZANTAC) 150 MG tablet Take 150 mg by mouth daily as needed.  05/09/15 11/05/15  Historical Provider, MD    Allergies Codeine; Morphine; Nalbuphine; Ondansetron hcl; Other; Penicillins; Tizanidine; Latex; Metoclopramide; Prochlorperazine; and Promethazine  Family History  Problem Relation Age of Onset  . Arthritis Mother   . Hyperlipidemia Mother   . Hypertension Mother   . Migraines Mother   . Arthritis Father   . Autism Son   . Cancer Maternal Grandfather     pancreatic  .  Stroke Maternal Grandfather   . Diabetes Paternal Grandmother     Social History Social History  Substance Use Topics  . Smoking status: Current Every Day Smoker    Packs/day: 0.50    Years: 3.00  . Smokeless tobacco: Never Used  . Alcohol use No     Review of Systems  Constitutional: No fever/chills Cardiovascular: No chest pain. Respiratory: No shortness of breath.  Musculoskeletal: Positive left knee pain with swelling. Negative for left lower leg, ankle or foot pain. Skin: Negative for rash, redness, abnormal warmth,  skin sores. Neurological: Negative for numbness, weakness, tingling. 10-point ROS otherwise negative.  ____________________________________________   PHYSICAL EXAM:  VITAL SIGNS: ED Triage Vitals  Enc Vitals Group     BP 06/01/16 0854 107/64     Pulse Rate 06/01/16 0854 83     Resp 06/01/16 0854 17     Temp 06/01/16 0854 98.3 F (36.8 C)     Temp Source 06/01/16 0854 Oral     SpO2 06/01/16 0854 100 %     Weight 06/01/16 0853 138 lb (62.6 kg)     Height --      Head Circumference --      Peak Flow --      Pain Score 06/01/16 0853 5     Pain Loc --      Pain Edu? --      Excl. in [place]? --      Constitutional: Alert and oriented. Well appearing and in no acute distress. Eyes: Conjunctivae are normal. Head: Atraumatic. Cardiovascular: Good peripheral circulation with 2+ pulses noted in the left lower extremity. Respiratory: Normal respiratory effort without tachypnea or retractions.  Musculoskeletal: Tenderness to palpation diffusely about the anterior knee. Negative patellofemoral grinding. Full range of motion of the left knee with mild pain with full flexion and full extension that resolves with rest. No laxity with anterior or posterior drawer. No laxity with varus or valgus stress. Linear postsurgical scar is noted about the superior, anterior knee without any abnormalities. No lower extremity tenderness nor edema.  No joint effusions. Neurologic:  Normal speech and language. No gross focal neurologic deficits are appreciated. Sensation to light touch grossly intact above the left lower extremity. Skin:  Skin is warm, dry and intact. No rash, redness, abnormal warmth, skin sores noted. Psychiatric: Mood and affect are normal. Speech and behavior are normal. Patient exhibits appropriate insight and judgement.   ____________________________________________    LABS  None ____________________________________________  EKG  None ____________________________________________  RADIOLOGY I, Braxton Feathers, personally viewed and evaluated these images (plain radiographs) as part of my medical decision making, as well as reviewing the written report by the radiologist.  Dg Knee Complete 4 Views Left  Result Date: 06/01/2016 CLINICAL DATA:  Pain for several days.  Swelling. EXAM: LEFT KNEE - COMPLETE 4+ VIEW COMPARISON:  None. FINDINGS: Frontal, lateral, and bilateral oblique views were obtained. Postoperative changes noted in the proximal tibia. No acute fracture or dislocation. There is a small joint effusion. Joint spaces appear unremarkable. No erosive change. IMPRESSION: Small joint effusion. No fracture dislocation. Postoperative change in proximal tibia. No appreciable joint space narrowing. Electronically Signed   By: Lowella Grip III M.D.   On: 06/01/2016 09:38    ____________________________________________    PROCEDURES  Procedure(s) performed: None   Procedures   Medications - No data to display   ____________________________________________   INITIAL IMPRESSION / ASSESSMENT AND PLAN / ED COURSE  Pertinent labs & imaging results  that were available during my care of the patient were reviewed by me and considered in my medical decision making (see chart for details).  Clinical Course    Patient's diagnosis is consistent withLeft knee effusion and chronic pain of the left knee. Patient was placed in an Ace wrap for comfort care. She states she has crutches at home in which she can utilize. Patient will be discharged home with prescriptions for Naprosyn to take as directed. Patient is to follow up with her orthopedic provider at [facility] for further evaluation and treatment if pain persists. Patient is given ED precautions to return to the ED for any worsening or new symptoms.     ____________________________________________  FINAL CLINICAL IMPRESSION(S) / ED DIAGNOSES  Final diagnoses:  Effusion of left knee  Chronic pain of left knee      NEW MEDICATIONS STARTED DURING THIS VISIT:  Discharge Medication List as of 06/01/2016 10:12 AM    START taking these medications   Details  naproxen (NAPROSYN) 500 MG tablet Take 1 tablet (500 mg total) by mouth 2 (two) times daily with a meal., Starting Mon 06/01/2016, Print             Judithe Modest [place], PA-C 06/01/16 Justin, MD 06/02/16 [phone]

## 2016-06-01 NOTE — Discharge Instructions (Signed)
Please call your orthopedic specialist at [facility] to schedule a follow up appointment.  Please use your crutches that you have at home.

## 2016-06-01 NOTE — ED Notes (Signed)
See triage note  Having left knee pain for several days   States pain is lateral and posterior with swelling .pain  radiates into lower leg  Increased apin with ambulation

## 2016-06-01 NOTE — ED Triage Notes (Signed)
Pt with knee pain

## 2016-06-02 ENCOUNTER — Other Ambulatory Visit: Payer: Self-pay | Admitting: Family Medicine

## 2016-06-02 NOTE — Telephone Encounter (Signed)
I checked [place] and it looks like we have never written this. She should get it from the provider who wrote it. Thanks!

## 2016-06-02 NOTE — Telephone Encounter (Signed)
Patient notified by [employer] Student.

## 2016-07-02 ENCOUNTER — Ambulatory Visit (INDEPENDENT_AMBULATORY_CARE_PROVIDER_SITE_OTHER): Payer: Medicaid Other | Admitting: Family Medicine

## 2016-07-02 ENCOUNTER — Encounter: Payer: Self-pay | Admitting: Family Medicine

## 2016-07-02 VITALS — BP 104/70 | HR 76 | Temp 98.1°F | Ht 67.2 in | Wt 136.1 lb

## 2016-07-02 DIAGNOSIS — K589 Irritable bowel syndrome without diarrhea: Secondary | ICD-10-CM

## 2016-07-02 DIAGNOSIS — Z0001 Encounter for general adult medical examination with abnormal findings: Secondary | ICD-10-CM | POA: Diagnosis not present

## 2016-07-02 DIAGNOSIS — Z124 Encounter for screening for malignant neoplasm of cervix: Secondary | ICD-10-CM

## 2016-07-02 DIAGNOSIS — I471 Supraventricular tachycardia: Secondary | ICD-10-CM

## 2016-07-02 DIAGNOSIS — G43909 Migraine, unspecified, not intractable, without status migrainosus: Secondary | ICD-10-CM

## 2016-07-02 DIAGNOSIS — Z0000 Encounter for general adult medical examination without abnormal findings: Secondary | ICD-10-CM

## 2016-07-02 DIAGNOSIS — F419 Anxiety disorder, unspecified: Secondary | ICD-10-CM

## 2016-07-02 DIAGNOSIS — E7210 Disorders of sulfur-bearing amino-acid metabolism, unspecified: Secondary | ICD-10-CM

## 2016-07-02 DIAGNOSIS — M791 Myalgia, unspecified site: Secondary | ICD-10-CM

## 2016-07-02 DIAGNOSIS — F331 Major depressive disorder, recurrent, moderate: Secondary | ICD-10-CM

## 2016-07-02 LAB — UA/M W/RFLX CULTURE, ROUTINE
Bilirubin, UA: NEGATIVE
Glucose, UA: NEGATIVE
KETONES UA: NEGATIVE
Leukocytes, UA: NEGATIVE
NITRITE UA: NEGATIVE
PH UA: 5.5 (ref 5.0–7.5)
Protein, UA: NEGATIVE
RBC UA: NEGATIVE
Specific Gravity, UA: >1.03 — ABNORMAL HIGH (ref 1.005–1.030)
UUROB: 0.2 mg/dL (ref 0.2–1.0)

## 2016-07-02 MED ORDER — RANITIDINE HCL 150 MG PO TABS: 150.0000 mg | ORAL_TABLET | Freq: Every day | ORAL | 3 refills | Status: DC | PRN

## 2016-07-02 NOTE — Assessment & Plan Note (Signed)
Stable. Continue to follow with cardiology. Call with any concerns.  

## 2016-07-02 NOTE — Assessment & Plan Note (Signed)
Improved on current regimen. Continue current regimen. Continue to monitor. Call with any concerns.  

## 2016-07-02 NOTE — Progress Notes (Signed)
BP 104/70 (BP Location: Left Arm, Patient Position: Sitting, Cuff Size: Normal)   Pulse 76   Temp 98.1 F (36.7 C)   Ht 5' 7.2" (1.707 m)   Wt 136 lb 1.6 oz (61.7 kg)   SpO2 98%   BMI 21.19 kg/m    Subjective:    Patient ID: Ellen Jackson, female    DOB: 1990/10/23, 25 y.o.   MRN: [id number]  HPI: Ellen Jackson is a 25 y.o. female presenting on 07/02/2016 for comprehensive medical examination. Current medical complaints include:  Has been having a lot more widespread muscular pain. Very painful to the touch. Has been going on for about 2 years. Seems to be better with BCs. Worse with being touched. Aching and widespread. Never had any labs done. Never been diagnosed with fibromyalgia.  Migraines are about the same. Didn't like seeing the [facility] because they pushed too many meds on her and she was anxious about that.   ANXIETY/STRESS- feeling better Duration:better Anxious mood: yes  Excessive worrying: yes Irritability: no  Sweating: no Nausea: no Palpitations:no Hyperventilation: no Panic attacks: no Agoraphobia: no  Obscessions/compulsions: no Depressed mood: yes Depression screen [facility] 2/9 07/02/2016 05/28/2016  Decreased Interest 1 1  Down, Depressed, Hopeless 1 2  PHQ - 2 Score 2 3  Altered sleeping - 2  Tired, decreased energy - 3  Change in appetite - 2  Feeling bad or failure about yourself  - 2  Trouble concentrating - 3  Moving slowly or fidgety/restless - 2  Suicidal thoughts - 0  PHQ-9 Score - 17   GAD 7 : Generalized Anxiety Score 07/02/2016 05/28/2016  Nervous, Anxious, on Edge 2 3  Control/stop worrying 1 2  Worry too much - different things 1 2  Trouble relaxing 2 3  Restless 2 2  Easily annoyed or irritable 2 3  Afraid - awful might happen 1 2  Total GAD 7 Score 11 17  Anxiety Difficulty Somewhat difficult -   Anhedonia: no Weight changes: no Insomnia: no   Hypersomnia: no Fatigue/loss of energy: yes Feelings of worthlessness:  no Feelings of guilt: no Impaired concentration/indecisiveness: no Suicidal ideations: no  Crying spells: no Recent Stressors/Life Changes: yes  She currently lives with: kids Menopausal Symptoms: no  Past Medical History:  Past Medical History:  Diagnosis Date  . Allergy   . Hernia, umbilical   . IBS (irritable bowel syndrome)   . Migraine with aura   . Ovarian cyst   . Spina bifida occulta   . SVT (supraventricular tachycardia) [facility])     Surgical History:  Past Surgical History:  Procedure Laterality Date  . APPENDECTOMY    . CESAREAN SECTION  2011  . CHOLECYSTECTOMY    . HERNIA REPAIR    . KNEE ARTHROSCOPY     X 3    Medications:  Current Outpatient Prescriptions on File Prior to Visit  Medication Sig  . citalopram (CELEXA) 40 MG tablet Take 1.5 tablets (60 mg total) by mouth daily.  . medroxyPROGESTERone (DEPO-PROVERA) 150 MG/ML injection Inject 150 mg into the muscle every 3 (three) months.  . metoprolol succinate (TOPROL XL) 25 MG 24 hr tablet Take 25 mg by mouth daily.   No current facility-administered medications on file prior to visit.     Allergies:  Allergies  Allergen Reactions  . Codeine     Other reaction(s): Other (See Comments) Passes out  . Morphine Itching and Shortness Of Breath  . Nalbuphine Itching and  Shortness Of Breath  . Ondansetron Hcl Hives and Rash  . Other Hives    Allergic to steri strips. tegaderm ok  . Penicillins Rash    Patient reports severe rash and itching. Has patient had a PCN reaction causing immediate rash, facial/tongue/throat swelling, SOB or lightheadedness with hypotension: Yes Has patient had a PCN reaction causing severe rash involving mucus membranes or skin necrosis: No Has patient had a PCN reaction that required hospitalization unknown Has patient had a PCN reaction occurring within the last 10 years:  unknown If all of the above answers are "NO", then may proceed  . Tizanidine Other (See Comments)     Worsens headache  . Latex Rash and Itching    Lips itching  . Metoclopramide Anxiety    Other reaction(s): Other (See Comments) "jittery" Difficulty breathing  . Prochlorperazine Anxiety and Other (See Comments)    Difficulty breathing  . Promethazine Anxiety    "jittery" Other reaction(s): Other (See Comments) "jittery" Difficulty breathing    Social History:  Social History   Social History  . Marital status: Single    Spouse name: N/A  . Number of children: N/A  . Years of education: N/A   Occupational History  . Not on file.   Social History Main Topics  . Smoking status: Current Every Day Smoker    Packs/day: 0.50    Years: 3.00  . Smokeless tobacco: Never Used  . Alcohol use No  . Drug use: No  . Sexual activity: Not on file   Other Topics Concern  . Not on file   Social History Narrative  . No narrative on file   History  Smoking Status  . Current Every Day Smoker  . Packs/day: 0.50  . Years: 3.00  Smokeless Tobacco  . Never Used   History  Alcohol Use No    Family History:  Family History  Problem Relation Age of Onset  . Arthritis Mother   . Hyperlipidemia Mother   . Hypertension Mother   . Migraines Mother   . Arthritis Father   . Autism Son   . Cancer Maternal Grandfather     pancreatic  . Stroke Maternal Grandfather   . Diabetes Paternal Grandmother     Past medical history, surgical history, medications, allergies, family history and social history reviewed with patient today and changes made to appropriate areas of the chart.   Review of Systems  Constitutional: Negative.   HENT: Negative.   Eyes: Negative.   Respiratory: Positive for cough. Negative for hemoptysis, sputum production, shortness of breath and wheezing.   Cardiovascular: Negative.   Gastrointestinal: Positive for heartburn and nausea. Negative for abdominal pain, blood in stool, constipation, diarrhea, melena and vomiting.  Genitourinary: Negative.     Musculoskeletal: Positive for myalgias. Negative for back pain, falls, joint pain and neck pain.  Skin: Negative.        Has a changed mole on her low back- changed about 2 years ago and hasn't changed since   Neurological: Positive for dizziness and headaches. Negative for tingling, tremors, sensory change, speech change, focal weakness, seizures and loss of consciousness.  Endo/Heme/Allergies: Negative for environmental allergies and polydipsia. Bruises/bleeds easily.  Psychiatric/Behavioral: Negative for depression, hallucinations, memory loss, substance abuse and suicidal ideas. The patient is nervous/anxious. The patient does not have insomnia.     All other ROS negative except what is listed above and in the HPI.      Objective:    BP 104/70 (BP  Location: Left Arm, Patient Position: Sitting, Cuff Size: Normal)   Pulse 76   Temp 98.1 F (36.7 C)   Ht 5' 7.2" (1.707 m)   Wt 136 lb 1.6 oz (61.7 kg)   SpO2 98%   BMI 21.19 kg/m   Wt Readings from Last 3 Encounters:  07/02/16 136 lb 1.6 oz (61.7 kg)  06/01/16 138 lb (62.6 kg)  05/28/16 138 lb 14.4 oz (63 kg)    Physical Exam  Constitutional: She is oriented to person, place, and time. She appears well-developed and well-nourished. No distress.  HENT:  Head: Normocephalic and atraumatic.  Right Ear: Hearing and external ear normal.  Left Ear: Hearing and external ear normal.  Nose: Nose normal.  Mouth/Throat: Oropharynx is clear and moist. No oropharyngeal exudate.  Eyes: Conjunctivae, EOM and lids are normal. Pupils are equal, round, and reactive to light. Right eye exhibits no discharge. Left eye exhibits no discharge. No scleral icterus.  Neck: Normal range of motion. Neck supple. No JVD present. No tracheal deviation present. No thyromegaly present.  Cardiovascular: Normal rate, regular rhythm, normal heart sounds and intact distal pulses.  Exam reveals no gallop and no friction rub.   No murmur heard. Pulmonary/Chest:  Effort normal and breath sounds normal. No stridor. No respiratory distress. She has no wheezes. She has no rales. She exhibits no tenderness. Right breast exhibits no inverted nipple, no mass, no nipple discharge, no skin change and no tenderness. Left breast exhibits no inverted nipple, no mass, no nipple discharge, no skin change and no tenderness. Breasts are symmetrical.  Abdominal: Soft. Bowel sounds are normal. She exhibits no distension and no mass. There is no tenderness. There is no rebound and no guarding. Hernia confirmed negative in the right inguinal area and confirmed negative in the left inguinal area.  Genitourinary: Vagina normal and uterus normal. No labial fusion. There is no rash, tenderness, lesion or injury on the right labia. There is no rash, tenderness, lesion or injury on the left labia. Uterus is not deviated, not enlarged, not fixed and not tender. Cervix exhibits no motion tenderness, no discharge and no friability. Right adnexum displays no mass, no tenderness and no fullness. Left adnexum displays no mass, no tenderness and no fullness. No erythema, tenderness or bleeding in the vagina. No foreign body in the vagina. No signs of injury around the vagina. No vaginal discharge found.  Musculoskeletal: Normal range of motion. She exhibits no edema, tenderness or deformity.  Lymphadenopathy:    She has no cervical adenopathy.  Neurological: She is alert and oriented to person, place, and time. She has normal reflexes. She displays normal reflexes. No cranial nerve deficit. She exhibits normal muscle tone. Coordination normal.  Skin: Skin is warm, dry and intact. No rash noted. She is not diaphoretic. No erythema. No pallor.  Psychiatric: She has a normal mood and affect. Her speech is normal and behavior is normal. Judgment and thought content normal. Cognition and memory are normal.  Nursing note and vitals reviewed.     Assessment & Plan:   Problem List Items Addressed  This Visit      Cardiovascular and Mediastinum   Headache, migraine    Will get her back into neurology. Referral put in today. Await results.       Relevant Orders   Ambulatory referral to Neurology   Paroxysmal supraventricular tachycardia ([place])    Stable. Continue to follow with cardiology. Call with any concerns.  Digestive   Adaptive colitis    Stable. Continue to follow with GI. Call with any concerns.       Relevant Medications   ranitidine (ZANTAC) 150 MG tablet     Other   Anxiety    Improved on current regimen. Continue current regimen. Continue to monitor. Call with any concerns.       Clinical depression    Improved on current regimen. Continue current regimen. Continue to monitor. Call with any concerns.       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 Continue to follow with GI. Call with any concerns.        Other Visit Diagnoses    Routine general medical examination at a health care facility    -  Primary   Up to date on vaccines. Screening labs checked today. Continue diet and exercise. Call with any concerns.    Relevant Orders   CBC with Differential/Platelet   Comprehensive metabolic panel   Lipid Panel w/o Chol/HDL Ratio   TSH   UA/M w/rflx Culture, Routine   Screening for cervical cancer       Pap done today.   Relevant Orders   Pap Lb, rfx HPV ASCU   Myalgia       ?fibromyalgia. Will check labs. She will keep a journal. Recheck 1 month.    Relevant Orders   Sed Rate (ESR)   Rheumatoid Arthritis Profile   VITAMIN D 25 Hydroxy (Vit-D Deficiency, Fractures)   Antinuclear Antib (ANA)   Lyme Ab/Western Blot Reflex   Rocky mtn spotted fvr abs pnl(IgG+IgM)   Babesia microti Antibody Panel   Ehrlichia Antibody Panel       Follow up plan: Return in about 4 weeks (around 07/30/2016) for Follow up muscle pain.   LABORATORY TESTING:  - Pap smear: pap done  IMMUNIZATIONS:   - Tdap: Tetanus vaccination  status reviewed: last tetanus booster within 10 years. - Influenza: Up to date - Pneumovax: Refused  PATIENT COUNSELING:   Advised to take 1 mg of folate supplement per day if capable of pregnancy.   Sexuality: Discussed sexually transmitted diseases, partner selection, use of condoms, avoidance of unintended pregnancy  and contraceptive alternatives.   Advised to avoid cigarette smoking.  I discussed with the patient that most people either abstain from alcohol or drink within safe limits (<=14/week and <=4 drinks/occasion for males, <=7/weeks and <= 3 drinks/occasion for females) and that the risk for alcohol disorders and other health effects rises proportionally with the number of drinks per week and how often a drinker exceeds daily limits.  Discussed cessation/primary prevention of drug use and availability of treatment for abuse.   Diet: Encouraged to adjust caloric intake to maintain  or achieve ideal body weight, to reduce intake of dietary saturated fat and total fat, to limit sodium intake by avoiding high sodium foods and not adding table salt, and to maintain adequate dietary potassium and calcium preferably from fresh fruits, vegetables, and low-fat dairy products.    stressed the importance of regular exercise  Injury prevention: Discussed safety belts, safety helmets, smoke detector, smoking near bedding or upholstery.   Dental health: Discussed importance of regular tooth brushing, flossing, and dental visits.    NEXT PREVENTATIVE PHYSICAL DUE IN 1 YEAR. Return in about 4 weeks (around 07/30/2016) for Follow up muscle pain.

## 2016-07-02 NOTE — Assessment & Plan Note (Signed)
Stable. Continue to follow with GI. Call with any concerns.  

## 2016-07-02 NOTE — Assessment & Plan Note (Signed)
Will get her back into neurology. Referral put in today. Await results.

## 2016-07-02 NOTE — Patient Instructions (Addendum)
Health Maintenance, Female Adopting a healthy lifestyle and getting preventive care can go a long way to promote health and wellness. Talk with your health care provider about what schedule of regular examinations is right for you. This is a good chance for you to check in with your provider about disease prevention and staying healthy. In between checkups, there are plenty of things you can do on your own. Experts have done a lot of research about which lifestyle changes and preventive measures are most likely to keep you healthy. Ask your health care provider for more information. WEIGHT AND DIET  Eat a healthy diet  Be sure to include plenty of vegetables, fruits, low-fat dairy products, and lean protein.  Do not eat a lot of foods high in solid fats, added sugars, or salt.  Get regular exercise. This is one of the most important things you can do for your health.  Most adults should exercise for at least 150 minutes each week. The exercise should increase your heart rate and make you sweat (moderate-intensity exercise).  Most adults should also do strengthening exercises at least twice a week. This is in addition to the moderate-intensity exercise.  Maintain a healthy weight  Body mass index (BMI) is a measurement that can be used to identify possible weight problems. It estimates body fat based on height and weight. Your health care provider can help determine your BMI and help you achieve or maintain a healthy weight.  For females 20 years of age and older:   A BMI below 18.5 is considered underweight.  A BMI of 18.5 to 24.9 is normal.  A BMI of 25 to 29.9 is considered overweight.  A BMI of 30 and above is considered obese.  Watch levels of cholesterol and blood lipids  You should start having your blood tested for lipids and cholesterol at 27 years of age, then have this test every 5 years.  You may need to have your cholesterol levels checked more often if:  Your lipid  or cholesterol levels are high.  You are older than 27 years of age.  You are at high risk for heart disease.  CANCER SCREENING   Lung Cancer  Lung cancer screening is recommended for adults 55-80 years old who are at high risk for lung cancer because of a history of smoking.  A yearly low-dose CT scan of the lungs is recommended for people who:  Currently smoke.  Have quit within the past 15 years.  Have at least a 30-pack-year history of smoking. A pack year is smoking an average of one pack of cigarettes a day for 1 year.  Yearly screening should continue until it has been 15 years since you quit.  Yearly screening should stop if you develop a health problem that would prevent you from having lung cancer treatment.  Breast Cancer  Practice breast self-awareness. This means understanding how your breasts normally appear and feel.  It also means doing regular breast self-exams. Let your health care provider know about any changes, no matter how small.  If you are in your 20s or 30s, you should have a clinical breast exam (CBE) by a health care provider every 1-3 years as part of a regular health exam.  If you are 40 or older, have a CBE every year. Also consider having a breast X-ray (mammogram) every year.  If you have a family history of breast cancer, talk to your health care provider about genetic screening.  If you   are at high risk for breast cancer, talk to your health care provider about having an MRI and a mammogram every year.  Breast cancer gene (BRCA) assessment is recommended for women who have family members with BRCA-related cancers. BRCA-related cancers include:  Breast.  Ovarian.  Tubal.  Peritoneal cancers.  Results of the assessment will determine the need for genetic counseling and BRCA1 and BRCA2 testing. Cervical Cancer Your health care provider may recommend that you be screened regularly for cancer of the pelvic organs (ovaries, uterus, and  vagina). This screening involves a pelvic examination, including checking for microscopic changes to the surface of your cervix (Pap test). You may be encouraged to have this screening done every 3 years, beginning at age 25.  For women ages 33-65, health care providers may recommend pelvic exams and Pap testing every 3 years, or they may recommend the Pap and pelvic exam, combined with testing for human papilloma virus (HPV), every 5 years. Some types of HPV increase your risk of cervical cancer. Testing for HPV may also be done on women of any age with unclear Pap test results.  Other health care providers may not recommend any screening for nonpregnant women who are considered low risk for pelvic cancer and who do not have symptoms. Ask your health care provider if a screening pelvic exam is right for you.  If you have had past treatment for cervical cancer or a condition that could lead to cancer, you need Pap tests and screening for cancer for at least 20 years after your treatment. If Pap tests have been discontinued, your risk factors (such as having a new sexual partner) need to be reassessed to determine if screening should resume. Some women have medical problems that increase the chance of getting cervical cancer. In these cases, your health care provider may recommend more frequent screening and Pap tests. Colorectal Cancer  This type of cancer can be detected and often prevented.  Routine colorectal cancer screening usually begins at 27 years of age and continues through 27 years of age.  Your health care provider may recommend screening at an earlier age if you have risk factors for colon cancer.  Your health care provider may also recommend using home test kits to check for hidden blood in the stool.  A small camera at the end of a tube can be used to examine your colon directly (sigmoidoscopy or colonoscopy). This is done to check for the earliest forms of colorectal  cancer.  Routine screening usually begins at age 46.  Direct examination of the colon should be repeated every 5-10 years through 27 years of age. However, you may need to be screened more often if early forms of precancerous polyps or small growths are found. Skin Cancer  Check your skin from head to toe regularly.  Tell your health care provider about any new moles or changes in moles, especially if there is a change in a mole's shape or color.  Also tell your health care provider if you have a mole that is larger than the size of a pencil eraser.  Always use sunscreen. Apply sunscreen liberally and repeatedly throughout the day.  Protect yourself by wearing long sleeves, pants, a wide-brimmed hat, and sunglasses whenever you are outside. HEART DISEASE, DIABETES, AND HIGH BLOOD PRESSURE   High blood pressure causes heart disease and increases the risk of stroke. High blood pressure is more likely to develop in:  People who have blood pressure in the high end  of the normal range (130-139/85-89 mm Hg).  People who are overweight or obese.  People who are African American.  If you are 31-49 years of age, have your blood pressure checked every 3-5 years. If you are 52 years of age or older, have your blood pressure checked every year. You should have your blood pressure measured twice--once when you are at a hospital or clinic, and once when you are not at a hospital or clinic. Record the average of the two measurements. To check your blood pressure when you are not at a hospital or clinic, you can use:  An automated blood pressure machine at a pharmacy.  A home blood pressure monitor.  If you are between 16 years and 50 years old, ask your health care provider if you should take aspirin to prevent strokes.  Have regular diabetes screenings. This involves taking a blood sample to check your fasting blood sugar level.  If you are at a normal weight and have a low risk for diabetes,  have this test once every three years after 27 years of age.  If you are overweight and have a high risk for diabetes, consider being tested at a younger age or more often. PREVENTING INFECTION  Hepatitis B  If you have a higher risk for hepatitis B, you should be screened for this virus. You are considered at high risk for hepatitis B if:  You were born in a country where hepatitis B is common. Ask your health care provider which countries are considered high risk.  Your parents were born in a high-risk country, and you have not been immunized against hepatitis B (hepatitis B vaccine).  You have HIV or AIDS.  You use needles to inject street drugs.  You live with someone who has hepatitis B.  You have had sex with someone who has hepatitis B.  You get hemodialysis treatment.  You take certain medicines for conditions, including cancer, organ transplantation, and autoimmune conditions. Hepatitis C  Blood testing is recommended for:  Everyone born from [birth year] through 1967.  Anyone with known risk factors for hepatitis C. Sexually transmitted infections (STIs)  You should be screened for sexually transmitted infections (STIs) including gonorrhea and chlamydia if:  You are sexually active and are younger than 27 years of age.  You are older than 27 years of age and your health care provider tells you that you are at risk for this type of infection.  Your sexual activity has changed since you were last screened and you are at an increased risk for chlamydia or gonorrhea. Ask your health care provider if you are at risk.  If you do not have HIV, but are at risk, it may be recommended that you take a prescription medicine daily to prevent HIV infection. This is called pre-exposure prophylaxis (PrEP). You are considered at risk if:  You are sexually active and do not regularly use condoms or know the HIV status of your partner(s).  You take drugs by injection.  You are sexually  active with a partner who has HIV. Talk with your health care provider about whether you are at high risk of being infected with HIV. If you choose to begin PrEP, you should first be tested for HIV. You should then be tested every 3 months for as long as you are taking PrEP.  PREGNANCY   If you are premenopausal and you may become pregnant, ask your health care provider about preconception counseling.  If you may  become pregnant, take 400 to 800 micrograms (mcg) of folic acid every day.  If you want to prevent pregnancy, talk to your health care provider about birth control (contraception). OSTEOPOROSIS AND MENOPAUSE   Osteoporosis is a disease in which the bones lose minerals and strength with aging. This can result in serious bone fractures. Your risk for osteoporosis can be identified using a bone density scan.  If you are 15 years of age or older, or if you are at risk for osteoporosis and fractures, ask your health care provider if you should be screened.  Ask your health care provider whether you should take a calcium or vitamin D supplement to lower your risk for osteoporosis.  Menopause may have certain physical symptoms and risks.  Hormone replacement therapy may reduce some of these symptoms and risks. Talk to your health care provider about whether hormone replacement therapy is right for you.  HOME CARE INSTRUCTIONS   Schedule regular health, dental, and eye exams.  Stay current with your immunizations.   Do not use any tobacco products including cigarettes, chewing tobacco, or electronic cigarettes.  If you are pregnant, do not drink alcohol.  If you are breastfeeding, limit how much and how often you drink alcohol.  Limit alcohol intake to no more than 1 drink per day for nonpregnant women. One drink equals 12 ounces of beer, 5 ounces of wine, or 1 ounces of hard liquor.  Do not use street drugs.  Do not share needles.  Ask your health care provider for help if  you need support or information about quitting drugs.  Tell your health care provider if you often feel depressed.  Tell your health care provider if you have ever been abused or do not feel safe at home.   This information is not intended to replace advice given to you by your health care provider. Make sure you discuss any questions you have with your health care provider.   Document Released: 02/23/2011 Document Revised: 08/31/2014 Document Reviewed: 07/12/2013 Elsevier Interactive Patient Education 2016 [employer] Myofascial Pain Syndrome and Fibromyalgia Myofascial pain syndrome and fibromyalgia are both pain disorders. This pain may be felt mainly in your muscles.   Myofascial pain syndrome:  Always has trigger points or tender points in the muscle that will cause pain when pressed. The pain may come and go.  Usually affects your neck, upper back, and shoulder areas. The pain often radiates into your arms and hands.  Fibromyalgia:  Has muscle pains and tenderness that come and go.  Is often associated with fatigue and sleep disturbances.  Has trigger points.  Tends to be long-lasting (chronic), but is not life-threatening. Fibromyalgia and myofascial pain are not the same. However, they often occur together. If you have both conditions, each can make the other worse. Both are common and can cause enough pain and fatigue to make day-to-day activities difficult.  CAUSES  The exact causes of fibromyalgia and myofascial pain are not known. People with certain gene types may be more likely to develop fibromyalgia. Some factors can be triggers for both conditions, such as:   Spine disorders.  Arthritis.  Severe injury (trauma) and other physical stressors.  Being under a lot of stress.  A medical illness. SIGNS AND SYMPTOMS  Fibromyalgia The main symptom of fibromyalgia is widespread pain and tenderness in your muscles. This can vary over time. Pain is sometimes  described as stabbing, shooting, or burning. You may have tingling or numbness, too. You may  also have sleep problems and fatigue. You may wake up feeling tired and groggy (fibro fog). Other symptoms may include:   Bowel and bladder problems.  Headaches.  Visual problems.  Problems with odors and noises.  Depression or mood changes.  Painful menstrual periods (dysmenorrhea).  Dry skin or eyes. Myofascial pain syndrome Symptoms of myofascial pain syndrome include:   Tight, ropy bands of muscle.   Uncomfortable sensations in muscular areas, such as:  Aching.  Cramping.  Burning.  Numbness.  Tingling.   Muscle weakness.  Trouble moving certain muscles freely (range of motion). DIAGNOSIS  There are no specific tests to diagnose fibromyalgia or myofascial pain syndrome. Both can be hard to diagnose because their symptoms are common in many other conditions. Your health care provider may suspect one or both of these conditions based on your symptoms and medical history. Your health care provider will also do a physical exam.  The key to diagnosing fibromyalgia is having pain, fatigue, and other symptoms for more than three months that cannot be explained by another condition.  The key to diagnosing myofascial pain syndrome is finding trigger points in muscles that are tender and cause pain elsewhere in your body (referred pain). TREATMENT  Treating fibromyalgia and myofascial pain often requires a team of health care providers. This usually starts with your primary provider and a physical therapist. You may also find it helpful to work with alternative health care providers, such as massage therapists or acupuncturists. Treatment for fibromyalgia may include medicines. This may include nonsteroidal anti-inflammatory drugs (NSAIDs), along with other medicines.  Treatment for myofascial pain may also include:  NSAIDs.  Cooling and stretching of muscles.  Trigger point  injections.  Sound wave (ultrasound) treatments to stimulate muscles. HOME CARE INSTRUCTIONS   Take medicines only as directed by your health care provider.  Exercise as directed by your health care provider or physical therapist.  Try to avoid stressful situations.  Practice relaxation techniques to control your stress. You may want to try:  Biofeedback.  Visual imagery.  Hypnosis.  Muscle relaxation.  Yoga.  Meditation.  Talk to your health care provider about alternative treatments, such as acupuncture or massage treatment.  Maintain a healthy lifestyle. This includes eating a healthy diet and getting enough sleep.  Consider joining a support group.  Do not do activities that stress or strain your muscles. That includes repetitive motions and heavy lifting. SEEK MEDICAL CARE IF:   You have new symptoms.  Your symptoms get worse.  You have side effects from your medicines.  You have trouble sleeping.  Your condition is causing depression or anxiety. FOR MORE INFORMATION   National Fibromyalgia Association: http://www.fmaware.orgwww.fmaware.[place]: http://www.arthritis.orgwww.arthritis.org  American Chronic Pain Association: StreetWrestling.at.https://stevens.biz/   This information is not intended to replace advice given to you by your health care provider. Make sure you discuss any questions you have with your health care provider.   Document Released: 08/10/2005 Document Revised: 08/31/2014 Document Reviewed: 05/16/2014 Elsevier Interactive Patient Education [employer].

## 2016-07-03 LAB — COMPREHENSIVE METABOLIC PANEL
ALK PHOS: 74 IU/L (ref 39–117)
ALT: 16 IU/L (ref 0–32)
AST: 17 IU/L (ref 0–40)
Albumin/Globulin Ratio: 1.6 (ref 1.2–2.2)
Albumin: 3.9 g/dL (ref 3.5–5.5)
BUN/Creatinine Ratio: 20 (ref 9–23)
BUN: 15 mg/dL (ref 6–20)
Bilirubin Total: 0.5 mg/dL (ref 0.0–1.2)
CALCIUM: 8.7 mg/dL (ref 8.7–10.2)
CO2: 23 mmol/L (ref 18–29)
CREATININE: 0.76 mg/dL (ref 0.57–1.00)
Chloride: 104 mmol/L (ref 96–106)
GFR calc Af Amer: 124 mL/min/{1.73_m2} (ref >59)
GFR, EST NON AFRICAN AMERICAN: 108 mL/min/{1.73_m2} (ref >59)
GLUCOSE: 75 mg/dL (ref 65–99)
Globulin, Total: 2.5 g/dL (ref 1.5–4.5)
Potassium: 4.4 mmol/L (ref 3.5–5.2)
SODIUM: 140 mmol/L (ref 134–144)
Total Protein: 6.4 g/dL (ref 6.0–8.5)

## 2016-07-03 LAB — CBC WITH DIFFERENTIAL/PLATELET
BASOS ABS: 0 10*3/uL (ref 0.0–0.2)
Basos: 0 %
EOS (ABSOLUTE): 0.3 10*3/uL (ref 0.0–0.4)
EOS: 4 %
HEMATOCRIT: 39.6 % (ref 34.0–46.6)
Hemoglobin: 13.1 g/dL (ref 11.1–15.9)
IMMATURE GRANULOCYTES: 0 %
Immature Grans (Abs): 0 10*3/uL (ref 0.0–0.1)
LYMPHS ABS: 1.6 10*3/uL (ref 0.7–3.1)
Lymphs: 28 %
MCH: 31.3 pg (ref 26.6–33.0)
MCHC: 33.1 g/dL (ref 31.5–35.7)
MCV: 95 fL (ref 79–97)
MONOS ABS: 0.5 10*3/uL (ref 0.1–0.9)
Monocytes: 9 %
NEUTROS PCT: 59 %
Neutrophils Absolute: 3.3 10*3/uL (ref 1.4–7.0)
PLATELETS: 147 10*3/uL — AB (ref 150–379)
RBC: 4.19 x10E6/uL (ref 3.77–5.28)
RDW: 13.4 % (ref 12.3–15.4)
WBC: 5.7 10*3/uL (ref 3.4–10.8)

## 2016-07-03 LAB — LIPID PANEL W/O CHOL/HDL RATIO
CHOLESTEROL TOTAL: 132 mg/dL (ref 100–199)
HDL: 47 mg/dL (ref >39)
LDL CALC: 76 mg/dL (ref 0–99)
TRIGLYCERIDES: 47 mg/dL (ref 0–149)
VLDL Cholesterol Cal: 9 mg/dL (ref 5–40)

## 2016-07-03 LAB — TSH: TSH: 0.955 u[IU]/mL (ref 0.450–4.500)

## 2016-07-04 LAB — EHRLICHIA ANTIBODY PANEL
E. Chaffeensis (HME) IgM Titer: NEGATIVE
E.Chaffeensis (HME) IgG: NEGATIVE
HGE IGG TITER: NEGATIVE
HGE IGM TITER: NEGATIVE

## 2016-07-04 LAB — LYME AB/WESTERN BLOT REFLEX

## 2016-07-04 LAB — ROCKY MTN SPOTTED FVR ABS PNL(IGG+IGM)
RMSF IGG: NEGATIVE
RMSF IgM: 0.27 index (ref 0.00–0.89)

## 2016-07-04 LAB — BABESIA MICROTI ANTIBODY PANEL

## 2016-07-04 LAB — VITAMIN D 25 HYDROXY (VIT D DEFICIENCY, FRACTURES): Vit D, 25-Hydroxy: 36.7 ng/mL (ref 30.0–100.0)

## 2016-07-04 LAB — ANA: Anti Nuclear Antibody(ANA): NEGATIVE

## 2016-07-04 LAB — SEDIMENTATION RATE: Sed Rate: 2 mm/hr (ref 0–32)

## 2016-07-04 LAB — RHEUMATOID ARTHRITIS PROFILE
CYCLIC CITRULLIN PEPTIDE AB: 12 U (ref 0–19)
Rhuematoid fact SerPl-aCnc: <10 IU/mL (ref 0.0–13.9)

## 2016-07-08 ENCOUNTER — Ambulatory Visit (INDEPENDENT_AMBULATORY_CARE_PROVIDER_SITE_OTHER): Payer: Medicaid Other

## 2016-07-08 ENCOUNTER — Other Ambulatory Visit: Payer: Medicaid Other

## 2016-07-08 DIAGNOSIS — Z309 Encounter for contraceptive management, unspecified: Secondary | ICD-10-CM | POA: Diagnosis not present

## 2016-07-08 DIAGNOSIS — Z3042 Encounter for surveillance of injectable contraceptive: Secondary | ICD-10-CM

## 2016-07-08 LAB — PAP LB, RFX HPV ASCU: PAP SMEAR COMMENT: 0

## 2016-07-08 LAB — PREGNANCY, URINE: Preg Test, Ur: NEGATIVE

## 2016-07-08 MED ORDER — MEDROXYPROGESTERONE ACETATE 150 MG/ML IM SUSP
150.0000 mg | Freq: Once | INTRAMUSCULAR | Status: AC
  Administered 2016-07-08: 150 mg via INTRAMUSCULAR

## 2016-07-30 ENCOUNTER — Encounter: Payer: Self-pay | Admitting: Family Medicine

## 2016-07-30 ENCOUNTER — Ambulatory Visit (INDEPENDENT_AMBULATORY_CARE_PROVIDER_SITE_OTHER): Payer: Medicaid Other | Admitting: Family Medicine

## 2016-07-30 VITALS — BP 110/76 | HR 67 | Temp 98.2°F | Wt 140.0 lb

## 2016-07-30 DIAGNOSIS — M797 Fibromyalgia: Secondary | ICD-10-CM | POA: Diagnosis not present

## 2016-07-30 MED ORDER — PREGABALIN 150 MG PO CAPS: 150.0000 mg | ORAL_CAPSULE | Freq: Two times a day (BID) | ORAL | 1 refills | Status: DC

## 2016-07-30 MED ORDER — PREGABALIN 75 MG PO CAPS: 75.0000 mg | ORAL_CAPSULE | Freq: Two times a day (BID) | ORAL | 0 refills | Status: DC

## 2016-07-30 NOTE — Progress Notes (Signed)
BP 110/76   Pulse 67   Temp 98.2 F (36.8 C)   Wt 140 lb (63.5 kg)   SpO2 99%   BMI 21.80 kg/m    Subjective:    Patient ID: Ellen Jackson, female    DOB: 07/02/1991, 25 y.o.   MRN: [id number]  HPI: Ellen Jackson is a 25 y.o. female  Chief Complaint  Patient presents with  . Muscle Pain    follow up, she says it's gotten a little worse and more frequent now.    FIBROMYALGIA Pain status: uncontrolled Satisfied with current treatment?: no Medication side effects: yes- could not tolerate gabapentin or amitriptyline Medication compliance: Not on anything right now Duration: chronic Location: widespread Quality: dull, aching, burning and ill-defined Current pain level: moderate Previous pain level: moderate Aggravating factors: lifting and prolonged sitting Alleviating factors: nothing Previous pain specialty evaluation: no Non-narcotic analgesic meds: yes- could not tolerate gabapentin or amitriptyline Narcotic contract:no Treatments attempted: rest, ice, heat, APAP, ibuprofen and aleve, gabapentin, amitriptyline  Relevant past medical, surgical, family and social history reviewed and updated as indicated. Interim medical history since our last visit reviewed. Allergies and medications reviewed and updated.  Review of Systems  Constitutional: Negative.   Respiratory: Negative.   Cardiovascular: Negative.   Musculoskeletal: Positive for arthralgias, myalgias, neck pain and neck stiffness. Negative for back pain, gait problem and joint swelling.  Psychiatric/Behavioral: Negative.     Per HPI unless specifically indicated above     Objective:    BP 110/76   Pulse 67   Temp 98.2 F (36.8 C)   Wt 140 lb (63.5 kg)   SpO2 99%   BMI 21.80 kg/m   Wt Readings from Last 3 Encounters:  07/30/16 140 lb (63.5 kg)  07/02/16 136 lb 1.6 oz (61.7 kg)  06/01/16 138 lb (62.6 kg)    Physical Exam  Constitutional: She is oriented to person, place, and time. She appears  well-developed and well-nourished. No distress.  HENT:  Head: Normocephalic and atraumatic.  Right Ear: Hearing normal.  Left Ear: Hearing normal.  Nose: Nose normal.  Eyes: Conjunctivae and lids are normal. Right eye exhibits no discharge. Left eye exhibits no discharge. No scleral icterus.  Cardiovascular: Normal rate, regular rhythm, normal heart sounds and intact distal pulses.  Exam reveals no gallop and no friction rub.   No murmur heard. Pulmonary/Chest: Effort normal and breath sounds normal. No respiratory distress. She has no wheezes. She has no rales. She exhibits no tenderness.  Musculoskeletal: Normal range of motion.  Neurological: She is alert and oriented to person, place, and time.  Skin: Skin is warm, dry and intact. No rash noted. She is not diaphoretic. No erythema. No pallor.  Psychiatric: She has a normal mood and affect. Her speech is normal and behavior is normal. Judgment and thought content normal. Cognition and memory are normal.  Nursing note and vitals reviewed.   Results for orders placed or performed in visit on 07/08/16  Pregnancy, urine  Result Value Ref Range   Preg Test, Ur Negative Negative      Assessment & Plan:   Problem List Items Addressed This Visit      Other   Fibromyalgia - Primary    Newly diagnosed. Has taken amitriptyline and and gabapentin for migraines with poor side effects. Will start her on lyrica. Aware that there will be a PA. She will start 75mg  BID x 1 week, then 150mg  BID after that. Work on exercise, diet  and stress management. Call with any concerns. Follow up 1 month.           Follow up plan: Return in about 4 weeks (around 08/27/2016) for Follow up lyrica.

## 2016-07-30 NOTE — Assessment & Plan Note (Signed)
Newly diagnosed. Has taken amitriptyline and and gabapentin for migraines with poor side effects. Will start her on lyrica. Aware that there will be a PA. She will start 75mg  BID x 1 week, then 150mg  BID after that. Work on exercise, diet and stress management. Call with any concerns. Follow up 1 month.

## 2016-08-10 ENCOUNTER — Ambulatory Visit (INDEPENDENT_AMBULATORY_CARE_PROVIDER_SITE_OTHER): Payer: Medicaid Other | Admitting: Surgery

## 2016-08-10 ENCOUNTER — Encounter: Payer: Self-pay | Admitting: Surgery

## 2016-08-10 VITALS — BP 103/72 | HR 70 | Temp 98.0°F | Ht 67.2 in | Wt 145.0 lb

## 2016-08-10 DIAGNOSIS — Z9889 Other specified postprocedural states: Secondary | ICD-10-CM

## 2016-08-10 DIAGNOSIS — R1033 Periumbilical pain: Secondary | ICD-10-CM

## 2016-08-10 DIAGNOSIS — Z8719 Personal history of other diseases of the digestive system: Secondary | ICD-10-CM | POA: Diagnosis not present

## 2016-08-10 NOTE — Progress Notes (Signed)
08/10/2016  History of Present Illness: Ellen Jackson is a 25 y.o. female status post incisional hernia repair with Dr. Felton Clinton in May 2015. She had had a prior laparoscopic appendectomy in 2014 and developed an incisional hernia at the umbilical site. She had a ventral hernia repair with Dr. Felton Clinton with an underlay mesh. She reports over the past year she has been having very umbilical pain that is intermittent in nature. She describes this pain starting at the umbilical site and either shooting down towards the pelvis or shooting up or sooner as the epigastric area. She does describe that this pain is worse when she's having a bowel movement and she is particularly straining. She describes having bowel movements every other day. Denies having any nausea or vomiting, fevers or chills. She does not report any new bulging over the umbilical area but does describe feeling bloated at times.  Past Medical History: Past Medical History:  Diagnosis Date  . Allergy   . Hernia, umbilical   . IBS (irritable bowel syndrome)   . Migraine with aura   . Ovarian cyst   . Spina bifida occulta   . SVT (supraventricular tachycardia) (HCC)      Past Surgical History: Past Surgical History:  Procedure Laterality Date  . APPENDECTOMY    . CESAREAN SECTION  2011  . CHOLECYSTECTOMY    . HERNIA REPAIR    . KNEE ARTHROSCOPY     X 3    Home Medications: Prior to Admission medications   Medication Sig Start Date End Date Taking? Authorizing Provider  citalopram (CELEXA) 40 MG tablet Take 1.5 tablets (60 mg total) by mouth daily. 05/28/16  Yes Megan P Johnson, DO  medroxyPROGESTERone (DEPO-PROVERA) 150 MG/ML injection Inject 150 mg into the muscle every 3 (three) months.   Yes Historical Provider, MD  metoprolol succinate (TOPROL XL) 25 MG 24 hr tablet Take 25 mg by mouth daily.   Yes Historical Provider, MD  pregabalin (LYRICA) 150 MG capsule Take 1 capsule (150 mg total) by mouth 2 (two) times daily. 07/30/16   Yes Megan P Johnson, DO  pregabalin (LYRICA) 75 MG capsule Take 1 capsule (75 mg total) by mouth 2 (two) times daily. 07/30/16  Yes Megan P Johnson, DO  ranitidine (ZANTAC) 150 MG tablet Take 1 tablet (150 mg total) by mouth daily as needed for heartburn. 07/02/16  Yes Megan Annia Friendly, DO    Allergies: Allergies  Allergen Reactions  . Codeine     Other reaction(s): Other (See Comments) Passes out  . Morphine Itching and Shortness Of Breath  . Nalbuphine Itching and Shortness Of Breath  . Ondansetron Hcl Hives and Rash  . Other Hives    Allergic to steri strips. tegaderm ok  . Penicillins Rash    Patient reports severe rash and itching. Has patient had a PCN reaction causing immediate rash, facial/tongue/throat swelling, SOB or lightheadedness with hypotension: Yes Has patient had a PCN reaction causing severe rash involving mucus membranes or skin necrosis: No Has patient had a PCN reaction that required hospitalization unknown Has patient had a PCN reaction occurring within the last 10 years:  unknown If all of the above answers are "NO", then may proceed  . Tizanidine Other (See Comments)    Worsens headache  . Latex Rash and Itching    Lips itching  . Metoclopramide Anxiety    Other reaction(s): Other (See Comments) "jittery" Difficulty breathing  . Prochlorperazine Anxiety and Other (See Comments)    Difficulty  breathing  . Promethazine Anxiety    "jittery" Other reaction(s): Other (See Comments) "jittery" Difficulty breathing    Social History:  reports that she has been smoking.  She has a 1.50 pack-year smoking history. She has never used smokeless tobacco. She reports that she does not drink alcohol or use drugs.   Family History: Family History  Problem Relation Age of Onset  . Arthritis Mother   . Hyperlipidemia Mother   . Hypertension Mother   . Migraines Mother   . Arthritis Father   . Autism Son   . Cancer Maternal Grandfather     pancreatic  . Stroke  Maternal Grandfather   . Diabetes Paternal Grandmother     Review of Systems: Review of Systems  Constitutional: Negative for chills and fever.  HENT: Negative for hearing loss.   Eyes: Negative for blurred vision.  Respiratory: Negative for cough and shortness of breath.   Cardiovascular: Negative for chest pain and leg swelling.  Gastrointestinal: Positive for abdominal pain and constipation. Negative for blood in stool, diarrhea, heartburn, nausea and vomiting.  Genitourinary: Negative for dysuria and hematuria.  Musculoskeletal: Negative for myalgias.  Skin: Negative for rash.  Neurological: Negative for dizziness.  Psychiatric/Behavioral: Negative for depression.  All other systems reviewed and are negative.   Physical Exam BP 103/72   Pulse 70   Temp 98 F (36.7 C) (Oral)   Ht 5' 7.2" (1.707 m)   Wt 65.8 kg (145 lb)   BMI 22.58 kg/m  CONSTITUTIONAL: No acute distress HEENT:  Normocephalic, atraumatic, extraocular motion intact. NECK: Trachea is midline, and there is no jugular venous distension.  RESPIRATORY:  Lungs are clear, and breath sounds are equal bilaterally. Normal respiratory effort without pathologic use of accessory muscles. CARDIOVASCULAR: Heart is regular without murmurs, gallops, or rubs. GI: The abdomen is soft, nondistended, with mild tenderness to palpation at the umbilical site.  On deep palpation I can feel the fascial edges separated again creating a 2 cm gap between the muscles. However there is no bulging at that point. There were no palpable masses.  MUSCULOSKELETAL:  Normal muscle strength and tone in all four extremities.  No peripheral edema or cyanosis. SKIN: Skin turgor is normal. There are no pathologic skin lesions.  NEUROLOGIC:  Motor and sensation is grossly normal.  Cranial nerves are grossly intact. PSYCH:  Alert and oriented to person, place and time. Affect is normal.   Assessment and Plan: This is a 25 y.o. female status post  incisional hernia repair with Dr. Felton Clinton 2 years ago who presents now with periumbilical pain.  -On exam, there is clearly a gap palpable deep to the umbilicus. However given that she had a mesh repair, the mesh is currently patching that defect still. Thus there is no true hernia or any contents bulging through. I explained to the patient that she could be having problems with pain due to constipation versus scarring of tissue to her mesh. At this point there is no indication of any bulging or strangulation occurring. -We'll start with conservative management with daily dosing of MiraLAX to see if her constipation improves and if that in turn improves her symptoms as well. We will attempt this for the next couple of months and we will see her in 2 months to check on the progress of her symptoms. There is no improvement we may need to do a CT scan to fully evaluate for any hernias. The patient understands this plan and all of her questions  have been answered.   Melvyn Neth, [place]

## 2016-08-10 NOTE — Patient Instructions (Signed)
We would like for you to try taking Miraalax daily to help with your constipation. Please take 17 grams of Miraalax daily once a day or if needed you can take twice daily. Please increase water intake to 72 ounces a day.  Please see your follow up appointment listed below.  Constipation, Adult Constipation is when a person:  Poops (has a bowel movement) fewer times in a week than normal.  Has a hard time pooping.  Has poop that is dry, hard, or bigger than normal. Follow these instructions at home: Eating and drinking  Eat foods that have a lot of fiber, such as:  Fresh fruits and vegetables.  Whole grains.  Beans.  Eat less of foods that are high in fat, low in fiber, or overly processed, such as:  [place] fries.  Hamburgers.  Cookies.  Candy.  Soda.  Drink enough fluid to keep your pee (urine) clear or pale yellow. General instructions  Exercise regularly or as told by your doctor.  Go to the restroom when you feel like you need to poop. Do not hold it in.  Take over-the-counter and prescription medicines only as told by your doctor. These include any fiber supplements.  Do pelvic floor retraining exercises, such as:  Doing deep breathing while relaxing your lower belly (abdomen).  Relaxing your pelvic floor while pooping.  Watch your condition for any changes.  Keep all follow-up visits as told by your doctor. This is important. Contact a doctor if:  You have pain that gets worse.  You have a fever.  You have not pooped for 4 days.  You throw up (vomit).  You are not hungry.  You lose weight.  You are bleeding from the anus.  You have thin, pencil-like poop (stool). Get help right away if:  You have a fever, and your symptoms suddenly get worse.  You leak poop or have blood in your poop.  Your belly feels hard or bigger than normal (is bloated).  You have very bad belly pain.  You feel dizzy or you faint. This information is not  intended to replace advice given to you by your health care provider. Make sure you discuss any questions you have with your health care provider. Document Released: 01/27/2008 Document Revised: 02/28/2016 Document Reviewed: 01/29/2016 Elsevier Interactive Patient Education  2017 [employer].

## 2016-08-11 ENCOUNTER — Ambulatory Visit: Payer: Medicaid Other | Admitting: Surgery

## 2016-08-25 ENCOUNTER — Telehealth: Payer: Self-pay | Admitting: Surgery

## 2016-08-25 DIAGNOSIS — R1033 Periumbilical pain: Secondary | ICD-10-CM

## 2016-08-25 NOTE — Telephone Encounter (Signed)
Hernia is gettiing worse and would like to schedule surgery. Was last seen in December.

## 2016-08-26 NOTE — Telephone Encounter (Signed)
Please call patient and schedule for incisional hernia repair with Dr. Hampton Abbot. Patient's H&P was done on 08/10/16. Orders will need placed once scheduled.

## 2016-08-26 NOTE — Telephone Encounter (Signed)
Spoke with Dr. Hampton Abbot in regards to patient. He has order CT of Abdomen and Pelvis and then would like to follow-up in clinic.  Orders placed.  CT scheduled for 09/01/16 at 0800am, arrive at [place] to [facility] Location.  Patient is to follow-up on 09/02/16 with Dr. Hampton Abbot in office.   Patient made aware of all appointment information.

## 2016-08-26 NOTE — Telephone Encounter (Signed)
Per Dr Mont Dutton notes--We'll start with conservative management with daily dosing of MiraLAX to see if her constipation improves and if that in turn improves her symptoms as well. We will attempt this for the next couple of months and we will see her in 2 months to check on the progress of her symptoms. There is no improvement we may need to do a CT scan to fully evaluate for any hernias. The patient understands this plan and all of her questions have been answered.   Please advise once you speak with Dr Hampton Abbot.

## 2016-08-27 ENCOUNTER — Encounter: Payer: Self-pay | Admitting: Family Medicine

## 2016-08-29 ENCOUNTER — Encounter: Payer: Self-pay | Admitting: Surgery

## 2016-08-31 ENCOUNTER — Ambulatory Visit: Payer: Medicaid Other | Admitting: Family Medicine

## 2016-09-01 ENCOUNTER — Ambulatory Visit
Admission: RE | Admit: 2016-09-01 | Discharge: 2016-09-01 | Disposition: A | Discharge status: Home / Self Care | Payer: Medicaid Other | Source: Ambulatory Visit | Attending: Surgery | Admitting: Surgery

## 2016-09-01 DIAGNOSIS — R1033 Periumbilical pain: Secondary | ICD-10-CM | POA: Diagnosis present

## 2016-09-01 DIAGNOSIS — R935 Abnormal findings on diagnostic imaging of other abdominal regions, including retroperitoneum: Secondary | ICD-10-CM | POA: Insufficient documentation

## 2016-09-01 MED ORDER — IOPAMIDOL (ISOVUE-300) INJECTION 61%
100.0000 mL | Freq: Once | INTRAVENOUS | Status: AC | PRN
  Administered 2016-09-01: 100 mL via INTRAVENOUS

## 2016-09-02 ENCOUNTER — Ambulatory Visit (INDEPENDENT_AMBULATORY_CARE_PROVIDER_SITE_OTHER): Payer: Medicaid Other | Admitting: Surgery

## 2016-09-02 ENCOUNTER — Encounter: Payer: Self-pay | Admitting: Surgery

## 2016-09-02 VITALS — BP 108/72 | HR 84 | Temp 97.9°F | Ht 67.0 in | Wt 151.6 lb

## 2016-09-02 DIAGNOSIS — K429 Umbilical hernia without obstruction or gangrene: Secondary | ICD-10-CM

## 2016-09-02 HISTORY — DX: Umbilical hernia without obstruction or gangrene: K42.9

## 2016-09-02 NOTE — Progress Notes (Signed)
09/02/2016  History of Present Illness: Ellen Jackson is a 26 y.o. female seen on [id number] for periumbilical pain. She had a prior laparoscopic appendectomy and developed a periumbilical hernia from her port site. This was repaired in 2015 by Dr. Felton Clinton. She had come in with peri-umbilical pain with concerns for a possible hernia. She had described to the point that she had been constipated with bowel movements only every other day at the most and we recommended that she take MiraLAX daily to help with her bowel movements. She is now having bowel movements once a day to every other day but she still having abdominal bloatedness and pain immediately superior to the umbilicus. She had a CAT scan done yesterday which shows increased stool burden throughout the colon as well as a possible recurrent periumbilical hernia. She describes that she still has episodes of pain just superior to the umbilicus that happens after her bowel movements. She does not notice pain with straining for a bowel movement but does notice pressure. Otherwise denies any fevers, chills, chest pain, shortness of breath.  Past Medical History: Past Medical History:  Diagnosis Date  . Allergy   . Hernia, umbilical   . IBS (irritable bowel syndrome)   . Migraine with aura   . Ovarian cyst   . Spina bifida occulta   . SVT (supraventricular tachycardia) (HCC)      Past Surgical History: Past Surgical History:  Procedure Laterality Date  . APPENDECTOMY    . CESAREAN SECTION  2011  . CHOLECYSTECTOMY    . HERNIA REPAIR    . KNEE ARTHROSCOPY     X 3    Home Medications: Prior to Admission medications   Medication Sig Start Date End Date Taking? Authorizing Provider  citalopram (CELEXA) 40 MG tablet Take 1.5 tablets (60 mg total) by mouth daily. 05/28/16  Yes Megan P Johnson, DO  medroxyPROGESTERone (DEPO-PROVERA) 150 MG/ML injection Inject 150 mg into the muscle every 3 (three) months.   Yes Historical Provider, MD  metoprolol  succinate (TOPROL XL) 25 MG 24 hr tablet Take 25 mg by mouth daily.   Yes Historical Provider, MD  pregabalin (LYRICA) 150 MG capsule Take 1 capsule (150 mg total) by mouth 2 (two) times daily. 07/30/16  Yes Megan P Johnson, DO  ranitidine (ZANTAC) 150 MG tablet Take 1 tablet (150 mg total) by mouth daily as needed for heartburn. 07/02/16  Yes Megan Annia Friendly, DO    Allergies: Allergies  Allergen Reactions  . Codeine     Other reaction(s): Other (See Comments) Passes out  . Morphine Itching and Shortness Of Breath  . Nalbuphine Itching and Shortness Of Breath  . Ondansetron Hcl Hives and Rash  . Other Hives    Allergic to steri strips. tegaderm ok  . Penicillins Rash    Patient reports severe rash and itching. Has patient had a PCN reaction causing immediate rash, facial/tongue/throat swelling, SOB or lightheadedness with hypotension: Yes Has patient had a PCN reaction causing severe rash involving mucus membranes or skin necrosis: No Has patient had a PCN reaction that required hospitalization unknown Has patient had a PCN reaction occurring within the last 10 years:  unknown If all of the above answers are "NO", then may proceed  . Tizanidine Other (See Comments)    Worsens headache  . Latex Rash and Itching    Lips itching  . Metoclopramide Anxiety    Other reaction(s): Other (See Comments) "jittery" Difficulty breathing  . Prochlorperazine Anxiety and  Other (See Comments)    Difficulty breathing  . Promethazine Anxiety    "jittery" Other reaction(s): Other (See Comments) "jittery" Difficulty breathing    Social History:  reports that she has been smoking.  She has a 1.50 pack-year smoking history. She has never used smokeless tobacco. She reports that she does not drink alcohol or use drugs.   Family History: Family History  Problem Relation Age of Onset  . Arthritis Mother   . Hyperlipidemia Mother   . Hypertension Mother   . Migraines Mother   . Arthritis Father    . Autism Son   . Cancer Maternal Grandfather     pancreatic  . Stroke Maternal Grandfather   . Diabetes Paternal Grandmother     Review of Systems: Review of Systems  Constitutional: Negative for chills and fever.  HENT: Negative for hearing loss.   Eyes: Negative for blurred vision.  Respiratory: Negative for cough and shortness of breath.   Cardiovascular: Negative for chest pain and leg swelling.  Gastrointestinal: Positive for abdominal pain and constipation. Negative for nausea and vomiting.  Musculoskeletal: Negative for myalgias.  Skin: Negative for rash.  Neurological: Negative for dizziness.  Psychiatric/Behavioral: Negative for depression.  All other systems reviewed and are negative.   Physical Exam BP 108/72   Pulse 84   Temp 97.9 F (36.6 C) (Oral)   Ht 5\' 7"  (1.702 m)   Wt 68.8 kg (151 lb 9.6 oz)   LMP 03/04/2016 (Within Weeks) Comment: Last Depo injection was 06/2016  BMI 23.74 kg/m  CONSTITUTIONAL: No acute distress HEENT:  Normocephalic, atraumatic, extraocular motion intact. NECK: Trachea is midline, and there is no jugular venous distension.  GI: The abdomen is soft, mildly distended. Patient has 2 scars around her umbilicus consistent with prior surgeries. She is tender to deep palpation on the superior portion of the umbilicus only. There is no palpable hernia defect but this is the point of tenderness can is correlated to the possible hernia finding on her CT scan. MUSCULOSKELETAL:  Normal muscle strength and tone in all four extremities.  No peripheral edema or cyanosis. SKIN: Skin turgor is normal. There are no pathologic skin lesions.  NEUROLOGIC:  Motor and sensation is grossly normal.  Cranial nerves are grossly intact. PSYCH:  Alert and oriented to person, place and time. Affect is normal.  Labs/Imaging: CT scan of the abdomen pelvis with contrast done on 1/9 showing increased stool throughout the colon with a probable small recurrent ventral  hernia at the superior margin of the umbilical hernia repair, containing fat.  Assessment and Plan: This is a 26 y.o. female who presents with a probable recurrent periumbilical hernia. -I discussed with the patient that she still has significant stool burden in her colon and is probably was causing her bloatedness and distention. Have recommended that she take MiraLAX twice daily to improve her bowel movements to decrease the amount of distention that she has. Decreasing the distention and bloatedness should also help decrease some of the pain that she is having. -We'll schedule the patient as well for recurrent umbilical hernia repair for 1/24. Have discussed with the patient that we will focus our attention to the area of discomfort that she's having and make any repairs as needed. She understands that she will not be able to do any heavy lifting of more than 10-15 pounds after the surgery for a period of 4 weeks. She is interested in doing the surgery as soon as possible so she can  get back to school.   Melvyn Neth, [place]

## 2016-09-02 NOTE — Patient Instructions (Signed)
We have scheduled your surgery for 09/16/16 with Dr.Piscoya at [facility]. Please see the [facility] pre-care sheet for surgery information. Please call our office if you have any questions or concerns.

## 2016-09-03 ENCOUNTER — Telehealth: Payer: Self-pay | Admitting: Surgery

## 2016-09-03 NOTE — Telephone Encounter (Signed)
Pt advised of pre op date/time and sx date. Sx: 09/16/16 with Dr Piscoya--Open umbilical hernia repair.  Pre op: 09/07/16 @ 1:00pm--Office.   Patient made aware to call [phone], between 1-3:00pm the day before surgery, to find out what time to arrive.

## 2016-09-07 ENCOUNTER — Inpatient Hospital Stay: Admission: RE | Admit: 2016-09-07 | Payer: Medicaid Other | Source: Ambulatory Visit

## 2016-09-09 ENCOUNTER — Encounter: Payer: Self-pay | Admitting: Family Medicine

## 2016-09-09 ENCOUNTER — Ambulatory Visit: Payer: Self-pay | Admitting: Family Medicine

## 2016-09-10 ENCOUNTER — Ambulatory Visit: Payer: Medicaid Other | Admitting: Neurology

## 2016-09-10 ENCOUNTER — Other Ambulatory Visit: Payer: Medicaid Other

## 2016-09-11 ENCOUNTER — Encounter
Admission: RE | Admit: 2016-09-11 | Discharge: 2016-09-11 | Disposition: A | Discharge status: Home / Self Care | Payer: Medicaid Other | Source: Ambulatory Visit | Attending: Surgery | Admitting: Surgery

## 2016-09-11 ENCOUNTER — Encounter: Payer: Self-pay | Admitting: Anesthesiology

## 2016-09-11 ENCOUNTER — Other Ambulatory Visit: Payer: Self-pay

## 2016-09-11 DIAGNOSIS — Z8679 Personal history of other diseases of the circulatory system: Secondary | ICD-10-CM | POA: Diagnosis not present

## 2016-09-11 DIAGNOSIS — Z01812 Encounter for preprocedural laboratory examination: Secondary | ICD-10-CM | POA: Diagnosis not present

## 2016-09-11 DIAGNOSIS — Z01810 Encounter for preprocedural cardiovascular examination: Secondary | ICD-10-CM | POA: Insufficient documentation

## 2016-09-11 DIAGNOSIS — Z01818 Encounter for other preprocedural examination: Secondary | ICD-10-CM

## 2016-09-11 HISTORY — DX: Anxiety disorder, unspecified: F41.9

## 2016-09-11 HISTORY — DX: Fibromyalgia: M79.7

## 2016-09-11 HISTORY — DX: Gastro-esophageal reflux disease without esophagitis: K21.9

## 2016-09-11 LAB — DIFFERENTIAL
Basophils Absolute: 0 10*3/uL (ref 0–0.1)
Basophils Relative: 0 %
EOS ABS: 0.5 10*3/uL (ref 0–0.7)
EOS PCT: 7 %
Lymphocytes Relative: 29 %
Lymphs Abs: 1.9 10*3/uL (ref 1.0–3.6)
MONO ABS: 0.5 10*3/uL (ref 0.2–0.9)
Monocytes Relative: 7 %
NEUTROS PCT: 57 %
Neutro Abs: 3.7 10*3/uL (ref 1.4–6.5)

## 2016-09-11 LAB — CBC
HCT: 40.5 % (ref 35.0–47.0)
Hemoglobin: 13.6 g/dL (ref 12.0–16.0)
MCH: 32 pg (ref 26.0–34.0)
MCHC: 33.7 g/dL (ref 32.0–36.0)
MCV: 95.1 fL (ref 80.0–100.0)
Platelets: 171 10*3/uL (ref 150–440)
RBC: 4.26 MIL/uL (ref 3.80–5.20)
RDW: 12.4 % (ref 11.5–14.5)
WBC: 6.6 10*3/uL (ref 3.6–11.0)

## 2016-09-11 NOTE — Progress Notes (Signed)
EKG noted. Poor R wave progression. No cardiac symptoms. Compared with nov 2015, no change. No consult needed.

## 2016-09-11 NOTE — Patient Instructions (Signed)
  Your procedure is scheduled on: September 16, 2016 (Wednesday) Report to Same Day Surgery 2nd floor medical mall [facility] Entrance-take elevator on left to 2nd floor.  Check in with surgery information desk.) To find out your arrival time please call [phone] between 1PM - 3PM on Janaury 23, 2018 (Tuesday)  Remember: Instructions that are not followed completely may result in serious medical risk, up to and including death, or upon the discretion of your surgeon and anesthesiologist your surgery may need to be rescheduled.    _x___ 1. Do not eat food or drink liquids after midnight. No gum chewing or hard candies.     __x__ 2. No Alcohol for 24 hours before or after surgery.   __x__3. No Smoking for 24 prior to surgery.   ____  4. Bring all medications with you on the day of surgery if instructed.    __x__ 5. Notify your doctor if there is any change in your medical condition     (cold, fever, infections).     Do not wear jewelry, make-up, hairpins, clips or nail polish.  Do not wear lotions, powders, or perfumes. You may wear deodorant.  Do not shave 48 hours prior to surgery. Men may shave face and neck.  Do not bring valuables to the hospital.    [facility] is not responsible for any belongings or valuables.               Contacts, dentures or bridgework may not be worn into surgery.  Leave your suitcase in the car. After surgery it may be brought to your room.  For patients admitted to the hospital, discharge time is determined by your treatment team.   Patients discharged the day of surgery will not be allowed to drive home.  You will need someone to drive you home and stay with you the night of your procedure.    Please read over the following fact sheets that you were given:   [facility] Preparing for Surgery and or MRSA Information   _x___ Take these medicines the morning of surgery with A SIP OF WATER:    1. Citalopram  2. Metoprolol  3. Lyrica  4.  Ranitidine (Ranitidine at bedtime on Tuesday night)  5.  6.  ____Fleets enema or Magnesium Citrate as directed.   _x___ Use CHG Soap or sage wipes as directed on instruction sheet   ____ Use inhalers on the day of surgery and bring to hospital day of surgery  ____ Stop metformin 2 days prior to surgery    ____ Take 1/2 of usual insulin dose the night before surgery and none on the morning of surgery           __x__ Stop Aspirin, Coumadin, Pllavix ,Eliquis, Effient, or Pradaxa (NO ASPIRIN)  x__ Stop Anti-inflammatories such as Advil, Aleve, Ibuprofen, Motrin, Naproxen,          Naprosyn, Goodies powders or aspirin products. Ok to take Tylenol.   ____ Stop supplements until after surgery.    ____ Bring C-Pap to the hospital.

## 2016-09-11 NOTE — Pre-Procedure Instructions (Signed)
Patient stated "body does not respond well to Gabapentin, and was placed on Lyrica." Dr. Hampton Abbot office notified, and instructed to d/c Gabapentin.

## 2016-09-14 ENCOUNTER — Encounter: Payer: Self-pay | Admitting: Surgery

## 2016-09-14 ENCOUNTER — Telehealth: Payer: Self-pay | Admitting: Surgery

## 2016-09-14 NOTE — Telephone Encounter (Signed)
Patient called and stated that she has a blood disorder MTHFR and is on birth control and smokes. Her surgery is 1/24 and wants to know if she should have a heparin shot before. She did before her last surgery.please call

## 2016-09-14 NOTE — Telephone Encounter (Signed)
Spoke with Dr. Hampton Abbot in regards to this patient. He would like a consult with hematology prior to surgery to find out if we need to place patient on short term anticoagulation therapy after surgery.  Spoke with patient. She does not have a hematologist. She is agreeable to be referred to the [facility] to see a hematologist. She is not happy that her surgery will most likely be delayed due to this. I explained to patient that this is extremely important to find out prior to surgery sp that we can prepare and keep her as safe as possible throughout this time.   Will call for appointment in am and notify patient of appointment.

## 2016-09-15 ENCOUNTER — Encounter: Payer: Self-pay | Admitting: Surgery

## 2016-09-15 NOTE — Telephone Encounter (Signed)
Patient has an appointment with Hemotology in [place] on 09/21/16 with Jaffe. She has been advised to bring her labs with her.   Surgery has been canceled at this time and patient is aware.

## 2016-09-16 ENCOUNTER — Ambulatory Visit: Admission: RE | Admit: 2016-09-16 | Payer: Medicaid Other | Source: Ambulatory Visit | Admitting: Surgery

## 2016-09-16 ENCOUNTER — Telehealth: Payer: Self-pay | Admitting: Family Medicine

## 2016-09-16 HISTORY — DX: Methylenetetrahydrofolate reductase deficiency: E72.12

## 2016-09-16 HISTORY — DX: Genetic susceptibility to other disease: Z15.89

## 2016-09-16 SURGERY — REPAIR, HERNIA, UMBILICAL, ADULT
Anesthesia: General

## 2016-09-16 NOTE — Telephone Encounter (Signed)
Patient came by the office to see if Dr Ellen Jackson had any results regarding her Lyrica. She is completely out today.  She has gone to the health department and also pharmacy.  Please advise.    Thank You [name]

## 2016-09-17 ENCOUNTER — Ambulatory Visit (INDEPENDENT_AMBULATORY_CARE_PROVIDER_SITE_OTHER): Payer: Medicaid Other | Admitting: Family Medicine

## 2016-09-17 VITALS — BP 95/61 | HR 78 | Ht 70.0 in | Wt 152.0 lb

## 2016-09-17 DIAGNOSIS — M797 Fibromyalgia: Secondary | ICD-10-CM | POA: Diagnosis not present

## 2016-09-17 MED ORDER — PREGABALIN 150 MG PO CAPS: 150.0000 mg | ORAL_CAPSULE | Freq: Two times a day (BID) | ORAL | 1 refills | Status: DC

## 2016-09-17 MED ORDER — PREGABALIN 75 MG PO CAPS: ORAL_CAPSULE | ORAL | 0 refills | Status: DC

## 2016-09-17 NOTE — Assessment & Plan Note (Signed)
Having issues with her insurance. Not able to get her lyrica covered. Working on trying get her insurance. If can't get it covered, will wean her off her medicine- Rx given today. Has been working really well. Continue to monitor call with any concerns.

## 2016-09-17 NOTE — Progress Notes (Addendum)
BP 95/61   Pulse 78   Ht 5\' 10"  (1.778 m)   Wt 152 lb (68.9 kg)   SpO2 98%   BMI 21.81 kg/m    Subjective:    Patient ID: Ellen Jackson, female    DOB: March 22, 1991, 26 y.o.   MRN: [id number]  HPI: Ellen Jackson is a 26 y.o. female  Chief Complaint  Patient presents with  . Follow-up  . Fibromyalgia   FIBROMYALGIA Pain status: better Satisfied with current treatment?: yes Medication side effects: no Medication compliance: good compliance Duration: chronic Location: wide spread Quality: dull and aching Current pain level: mild Previous pain level: severe Aggravating factors: lifting, movement, walking and bending Alleviating factors: rest, ice, heat, laying, NSAIDs, APAP, narcotics and muscle relaxer Previous pain specialty evaluation: no Non-narcotic analgesic meds: yes Narcotic contract:no Treatments attempted: lyrica, rest, ice, heat, APAP, ibuprofen, aleve and physical therapy    Relevant past medical, surgical, family and social history reviewed and updated as indicated. Interim medical history since our last visit reviewed. Allergies and medications reviewed and updated.  Review of Systems  Constitutional: Negative.   Respiratory: Negative.   Cardiovascular: Negative.   Musculoskeletal: Positive for myalgias. Negative for arthralgias, back pain, gait problem, joint swelling, neck pain and neck stiffness.  Psychiatric/Behavioral: Negative.     Per HPI unless specifically indicated above     Objective:    BP 95/61   Pulse 78   Ht 5\' 10"  (1.778 m)   Wt 152 lb (68.9 kg)   SpO2 98%   BMI 21.81 kg/m   Wt Readings from Last 3 Encounters:  09/17/16 152 lb (68.9 kg)  09/11/16 151 lb (68.5 kg)  09/02/16 151 lb 9.6 oz (68.8 kg)    Physical Exam  Constitutional: She is oriented to person, place, and time. She appears well-developed and well-nourished. No distress.  HENT:  Head: Normocephalic and atraumatic.  Right Ear: Hearing normal.  Left Ear: Hearing  normal.  Nose: Nose normal.  Eyes: Conjunctivae, EOM and lids are normal. Pupils are equal, round, and reactive to light. Right eye exhibits no discharge. Left eye exhibits no discharge. No scleral icterus.  Cardiovascular: Normal rate, regular rhythm, normal heart sounds and intact distal pulses.  Exam reveals no gallop and no friction rub.   No murmur heard. Pulmonary/Chest: Effort normal and breath sounds normal. No respiratory distress. She has no wheezes. She has no rales. She exhibits no tenderness.  Musculoskeletal: Normal range of motion.  Neurological: She is alert and oriented to person, place, and time.  Skin: Skin is warm, dry and intact. No rash noted. She is not diaphoretic. No erythema. No pallor.  Psychiatric: She has a normal mood and affect. Her speech is normal and behavior is normal. Judgment and thought content normal. Cognition and memory are normal.  Nursing note and vitals reviewed.   Results for orders placed or performed during the hospital encounter of 09/11/16  CBC  Result Value Ref Range   WBC 6.6 3.6 - 11.0 K/uL   RBC 4.26 3.80 - 5.20 MIL/uL   Hemoglobin 13.6 12.0 - 16.0 g/dL   HCT 40.5 35.0 - 47.0 %   MCV 95.1 80.0 - 100.0 fL   MCH 32.0 26.0 - 34.0 pg   MCHC 33.7 32.0 - 36.0 g/dL   RDW 12.4 11.5 - 14.5 %   Platelets 171 150 - 440 K/uL  Differential  Result Value Ref Range   Neutrophils Relative % 57 %   Neutro  Abs 3.7 1.4 - 6.5 K/uL   Lymphocytes Relative 29 %   Lymphs Abs 1.9 1.0 - 3.6 K/uL   Monocytes Relative 7 %   Monocytes Absolute 0.5 0.2 - 0.9 K/uL   Eosinophils Relative 7 %   Eosinophils Absolute 0.5 0 - 0.7 K/uL   Basophils Relative 0 %   Basophils Absolute 0.0 0 - 0.1 K/uL      Assessment & Plan:   Problem List Items Addressed This Visit      Other   Fibromyalgia - Primary    Having issues with her insurance. Not able to get her lyrica covered. Working on trying get her insurance. If can't get it covered, will wean her off her  medicine- Rx given today. Has been working really well. Continue to monitor call with any concerns.          Addendum: 09/17/16 1:30- Got a voucher to continue 150mg  lyrica. New Rx written.   Follow up plan: Return in about 4 weeks (around 10/15/2016).

## 2016-09-17 NOTE — Addendum Note (Signed)
Addended by: Valerie Roys on: 09/17/2016 01:32 PM   Modules accepted: Orders

## 2016-09-17 NOTE — Telephone Encounter (Signed)
I tried to do a PA on patient's Lyrica 09/15/2016. It came back and said that patient was not enrolled. T. Reel talked to patient and told her to go to [employer] because of [employer], she was not active.   Patient came in today. I attempted PA 2 more times and it said the same thing. I called [employer] because patient stated the health department told her she was active and enrolled.   According to [employer] she has no active coverage right now. When I explained other places were showing her as active, I was told it could take a few days for patient to become active to the state.   Dr. Wynetta Emery notified.   Call Case # [id number]

## 2016-09-19 ENCOUNTER — Encounter: Payer: Self-pay | Admitting: Family Medicine

## 2016-09-21 ENCOUNTER — Inpatient Hospital Stay: Payer: Medicaid Other | Attending: Oncology | Admitting: Oncology

## 2016-09-21 ENCOUNTER — Telehealth: Payer: Self-pay | Admitting: *Deleted

## 2016-09-21 ENCOUNTER — Encounter: Payer: Self-pay | Admitting: Oncology

## 2016-09-21 VITALS — BP 109/75 | HR 82 | Temp 99.0°F | Resp 18 | Ht 67.2 in | Wt 158.5 lb

## 2016-09-21 DIAGNOSIS — E7212 Methylenetetrahydrofolate reductase deficiency: Secondary | ICD-10-CM | POA: Diagnosis not present

## 2016-09-21 DIAGNOSIS — Z7982 Long term (current) use of aspirin: Secondary | ICD-10-CM | POA: Diagnosis not present

## 2016-09-21 DIAGNOSIS — Z8249 Family history of ischemic heart disease and other diseases of the circulatory system: Secondary | ICD-10-CM | POA: Diagnosis not present

## 2016-09-21 DIAGNOSIS — K219 Gastro-esophageal reflux disease without esophagitis: Secondary | ICD-10-CM | POA: Diagnosis not present

## 2016-09-21 DIAGNOSIS — Z79899 Other long term (current) drug therapy: Secondary | ICD-10-CM | POA: Diagnosis not present

## 2016-09-21 DIAGNOSIS — E282 Polycystic ovarian syndrome: Secondary | ICD-10-CM | POA: Insufficient documentation

## 2016-09-21 DIAGNOSIS — K589 Irritable bowel syndrome without diarrhea: Secondary | ICD-10-CM | POA: Diagnosis not present

## 2016-09-21 DIAGNOSIS — Z793 Long term (current) use of hormonal contraceptives: Secondary | ICD-10-CM | POA: Insufficient documentation

## 2016-09-21 DIAGNOSIS — D821 Di George's syndrome: Secondary | ICD-10-CM | POA: Insufficient documentation

## 2016-09-21 DIAGNOSIS — K429 Umbilical hernia without obstruction or gangrene: Secondary | ICD-10-CM | POA: Insufficient documentation

## 2016-09-21 DIAGNOSIS — Z1589 Genetic susceptibility to other disease: Secondary | ICD-10-CM

## 2016-09-21 DIAGNOSIS — F17210 Nicotine dependence, cigarettes, uncomplicated: Secondary | ICD-10-CM | POA: Insufficient documentation

## 2016-09-21 DIAGNOSIS — R109 Unspecified abdominal pain: Secondary | ICD-10-CM | POA: Diagnosis not present

## 2016-09-21 DIAGNOSIS — R5383 Other fatigue: Secondary | ICD-10-CM

## 2016-09-21 DIAGNOSIS — Z8759 Personal history of other complications of pregnancy, childbirth and the puerperium: Secondary | ICD-10-CM | POA: Diagnosis not present

## 2016-09-21 DIAGNOSIS — R5381 Other malaise: Secondary | ICD-10-CM | POA: Diagnosis not present

## 2016-09-21 DIAGNOSIS — K439 Ventral hernia without obstruction or gangrene: Secondary | ICD-10-CM

## 2016-09-21 DIAGNOSIS — M797 Fibromyalgia: Secondary | ICD-10-CM

## 2016-09-21 NOTE — Progress Notes (Signed)
Hematology/Oncology Consult note [facility] [phone] [phone]  Patient Care Team: Valerie Roys, DO as PCP - General (Family Medicine)   Name of the patient: Ellen Jackson  [id number]  07-30-1991    Reason for referral- history of heterozygosity for MTHFR gene mutation for surgical clearance   Referring physician- Dr. Hampton Abbot  Date of visit: 09/21/16   History of presenting illness- Patient is a 26 yr old female no prior h/o blood clots. She has undergone multipls surgeries in the past including GB and appendix removal, knee and hernia repair without any issues. She has DiGeorges syndrome and prior h/o miscarriages. Her mother has had 3 episodes of DVTs/PE so far. Last one was in 2014. Mother was found to be homozygous for MTHFR and possible anti phospholipid antibody syndrome. Patient is a current smoker and somkes about 1/2 pack per day and has done so for several years. She is also on injectable hormone DEPO shots for her PCOS. She is scheduled to undergo another repair of her abdominal wall hernia which is causing abdominal pain  ECOG PS- 0  Pain scale- 3   Review of systems- Review of Systems  Constitutional: Positive for malaise/fatigue. Negative for chills, fever and weight loss.  HENT: Negative for congestion, ear discharge and nosebleeds.   Eyes: Negative for blurred vision.  Respiratory: Negative for cough, hemoptysis, sputum production, shortness of breath and wheezing.   Cardiovascular: Negative for chest pain, palpitations, orthopnea and claudication.  Gastrointestinal: Positive for abdominal pain. Negative for blood in stool, constipation, diarrhea, heartburn, melena, nausea and vomiting.  Genitourinary: Negative for dysuria, flank pain, frequency, hematuria and urgency.  Musculoskeletal: Negative for back pain, joint pain and myalgias.  Skin: Negative for rash.  Neurological: Negative for dizziness, tingling, focal  weakness, seizures, weakness and headaches.  Endo/Heme/Allergies: Does not bruise/bleed easily.  Psychiatric/Behavioral: Negative for depression and suicidal ideas. The patient does not have insomnia.     Allergies  Allergen Reactions  . Codeine     Other reaction(s): Other (See Comments) Passes out  . Morphine Itching and Shortness Of Breath  . Nalbuphine Itching and Shortness Of Breath  . Ondansetron Hcl Hives and Rash  . Other Hives    Allergic to steri strips. tegaderm ok  . Penicillins Rash    Patient reports severe rash and itching. Has patient had a PCN reaction causing immediate rash, facial/tongue/throat swelling, SOB or lightheadedness with hypotension: Yes Has patient had a PCN reaction causing severe rash involving mucus membranes or skin necrosis: No Has patient had a PCN reaction that required hospitalization unknown Has patient had a PCN reaction occurring within the last 10 years:  unknown If all of the above answers are "NO", then may proceed  . Tape   . Tizanidine Other (See Comments)    Worsens headache  . Latex Rash and Itching    Lips itching  . Metoclopramide Anxiety    Other reaction(s): Other (See Comments) "jittery" Difficulty breathing  . Prochlorperazine Anxiety and Other (See Comments)    Difficulty breathing  . Promethazine Anxiety and Other (See Comments)    "jittery" Difficulty breathing "jittery" Other reaction(s): Other (See Comments) "jittery" Difficulty breathing    Patient Active Problem List   Diagnosis Date Noted  . Recurrent umbilical hernia 99991111  . Fibromyalgia 07/30/2016  . Anxiety 02/05/2015  . Headache, migraine 02/05/2015  . Clinical depression 02/05/2015  . 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  Paroxysmal supraventricular tachycardia ([place]) 02/05/2015  . LLQ abdominal pain 02/05/2015  . Anxiety disorder 02/05/2015  . Major depressive disorder, single  episode 02/05/2015  . Gonalgia 10/16/2014     Past Medical History:  Diagnosis Date  . Allergy   . Anxiety   . Fibromyalgia   . GERD (gastroesophageal reflux disease)   . Hernia, umbilical   . Heterozygous MTHFR mutation [id number] ([place])   . IBS (irritable bowel syndrome)   . Migraine with aura   . Ovarian cyst   . Spina bifida occulta   . SVT (supraventricular tachycardia) ([facility])      Past Surgical History:  Procedure Laterality Date  . APPENDECTOMY    . CESAREAN SECTION  2011  . CHOLECYSTECTOMY    . HERNIA REPAIR     Umbilical Hernia Repair woth mesh  . KNEE ARTHROSCOPY Left    X 3  . OVARIAN CYST SURGERY      Social History   Social History  . Marital status: Single    Spouse name: N/A  . Number of children: N/A  . Years of education: N/A   Occupational History  . Not on file.   Social History Main Topics  . Smoking status: Current Every Day Smoker    Packs/day: 0.50    Years: 3.00    Types: Cigarettes  . Smokeless tobacco: Never Used  . Alcohol use No  . Drug use: No  . Sexual activity: Not on file   Other Topics Concern  . Not on file   Social History Narrative  . No narrative on file     Family History  Problem Relation Age of Onset  . Arthritis Mother   . Hyperlipidemia Mother   . Hypertension Mother   . Migraines Mother   . Arthritis Father   . Autism Son   . Cancer Maternal Grandfather     pancreatic  . Stroke Maternal Grandfather   . Diabetes Paternal Grandmother      Current Outpatient Prescriptions:  .  Aspirin-Salicylamide-Caffeine (BC FAST PAIN RELIEF) 650-195-33.3 MG PACK, Take 1 packet by mouth every 8 (eight) hours as needed (for pain.)., Disp: , Rfl:  .  citalopram (CELEXA) 40 MG tablet, Take 1.5 tablets (60 mg total) by mouth daily., Disp: 45 tablet, Rfl: 3 .  ibuprofen (ADVIL,MOTRIN) 200 MG tablet, Take 800 mg by mouth every 8 (eight) hours as needed., Disp: , Rfl:  .  medroxyPROGESTERone (DEPO-PROVERA) 150 MG/ML injection,  Inject 150 mg into the muscle every 3 (three) months., Disp: , Rfl:  .  metoprolol succinate (TOPROL XL) 25 MG 24 hr tablet, Take 25 mg by mouth daily., Disp: , Rfl:  .  polyethylene glycol (MIRALAX / GLYCOLAX) packet, Take 17 g by mouth 2 (two) times daily as needed (for constipation.)., Disp: , Rfl:  .  pregabalin (LYRICA) 150 MG capsule, Take 1 capsule (150 mg total) by mouth 2 (two) times daily., Disp: 60 capsule, Rfl: 1 .  ranitidine (ZANTAC) 150 MG tablet, Take 1 tablet (150 mg total) by mouth daily as needed for heartburn., Disp: 90 tablet, Rfl: 3   Physical exam:  Vitals:   09/21/16 0852  BP: 109/75  Pulse: 82  Resp: 18  Temp: 99 F (37.2 C)  TempSrc: Tympanic  Weight: 158 lb 8.2 oz (71.9 kg)  Height: 5' 7.2" (1.707 m)   Physical Exam  Constitutional: She is oriented to person, place, and time and well-developed, well-nourished, and in no distress.  HENT:  Head: Normocephalic  and atraumatic.  Eyes: EOM are normal. Pupils are equal, round, and reactive to light.  Neck: Normal range of motion.  Cardiovascular: Normal rate, regular rhythm and normal heart sounds.   Pulmonary/Chest: Effort normal and breath sounds normal.  Abdominal: Soft. Bowel sounds are normal.  Neurological: She is alert and oriented to person, place, and time.  Skin: Skin is warm and dry.       CMP Latest Ref Rng & Units 07/02/2016  Glucose 65 - 99 mg/dL 75  BUN 6 - 20 mg/dL 15  Creatinine 0.57 - 1.00 mg/dL 0.76  Sodium 134 - 144 mmol/L 140  Potassium 3.5 - 5.2 mmol/L 4.4  Chloride 96 - 106 mmol/L 104  CO2 18 - 29 mmol/L 23  Calcium 8.7 - 10.2 mg/dL 8.7  Total Protein 6.0 - 8.5 g/dL 6.4  Total Bilirubin 0.0 - 1.2 mg/dL 0.5  Alkaline Phos 39 - 117 IU/L 74  AST 0 - 40 IU/L 17  ALT 0 - 32 IU/L 16   CBC Latest Ref Rng & Units 09/11/2016  WBC 3.6 - 11.0 K/uL 6.6  Hemoglobin 12.0 - 16.0 g/dL 13.6  Hematocrit 35.0 - 47.0 % 40.5  Platelets 150 - 440 K/uL 171    No images are attached to the  encounter.  Ct Abdomen Pelvis W Contrast  Result Date: 09/01/2016 CLINICAL DATA:  Mid abdominal pain and distention worsening for 2 months, 5 pound weight gain in last 2 months, history of prior umbilical hernia repair, cholecystectomy, appendectomy, BILATERAL ovarian cysts, smoker EXAM: CT ABDOMEN AND PELVIS WITH CONTRAST TECHNIQUE: Multidetector CT imaging of the abdomen and pelvis was performed using the standard protocol following bolus administration of intravenous contrast. Sagittal and coronal MPR images reconstructed from axial data set. CONTRAST:  146mL ISOVUE-300 IOPAMIDOL (ISOVUE-300) INJECTION 61% IV. Dilute oral contrast. COMPARISON:  05/15/2014 FINDINGS: Lower chest: Lung bases clear Hepatobiliary: Gallbladder surgically absent. Elongated lateral segment LEFT lobe liver extending to LEFT lateral abdominal wall lateral to spleen. No additional focal hepatic abnormalities. Pancreas: Normal appearance Spleen: Normal appearance Adrenals/Urinary Tract: Adrenal glands, kidneys, ureters, and bladder normal appearance. Stomach/Bowel: Appendix surgically absent. Increased stool throughout colon into rectum. Stomach and bowel loops otherwise normal appearance. Vascular/Lymphatic: Vascular structures unremarkable. No adenopathy. Reproductive: Uterus and adnexa normal appearance Other: No free air or free fluid. Probable small ventral hernia at superior margin of the umbilical hernia repair, containing fat. Musculoskeletal: Pectus deformity at lower chest. No acute osseous findings. IMPRESSION: Increased stool throughout colon. Probable small recurrent ventral hernia at the superior margin of the umbilical hernia repair, containing fat. No other intra-abdominal or intrapelvic abnormalities identified. Electronically Signed   By: Lavonia Dana M.D.   On: 09/01/2016 08:28    Assessment and plan- Patient is a 26 y.o. female with a h/o hormone contraceptive use, h/o smoking and heterozygosity for MTHFR gene  mutation  1. Checking heterozygosity for MTHFR gene mutation is not recommended in asymptomatic individuals and as such has no role in decision making for surgical clearance. Patients biggest risk factor for blood clots is her use of hormone contraception and smoking. Patient cannot come off hormone contraception prior to surgery because she says she gets severe symptoms of PCOS. Hence I would recommend that patient should be placed on prophylactic dose of lovenox 40 mg [place] once daily for 1 week post surgery. She should ambulate as soon as possible post surgery. MTHFR gene mutation as such does not need any special precautions in the peri operative period. I  have strongly encouraged patient to quit smoking as that would bring down her risk of blood clots in the future. She is willing to consider when she moves to her new house in the near future.    Thank you for this kind referral and the opportunity to participate in the care of this patient   Visit Diagnosis 1. Family history of blood clots   2. Heterozygous for MTHFR gene mutation ([place])   3. Long term (current) use of hormonal contraceptives     Dr. Randa Evens, MD, MPH [facility] Pager- [phone] 09/21/2016  9:45 AM

## 2016-09-21 NOTE — Telephone Encounter (Signed)
Sent inbasket message to Dr. Hampton Abbot that it was ok to go ahead with surgery.  Because pt is on contraceptives and a smoker it is rec: for lovenox .  Also faxed the whole note to MD [phone]

## 2016-09-22 ENCOUNTER — Ambulatory Visit: Payer: Medicaid Other | Admitting: Family Medicine

## 2016-09-22 ENCOUNTER — Telehealth: Payer: Self-pay

## 2016-09-22 NOTE — Telephone Encounter (Signed)
Hematology consult has been completed and Dr. Janese Banks is recommending Lovenox 40mg  [place] Daily x 7 days post-surgery.  Spoke with Dr. Hampton Abbot in regards to this. He would like to find out cost of Lovenox for patient and would like to reschedule surgery for 10/09/16.   I called patient's pharmacy, [facility]. Prescription was given to [facility] manager). She states that this will cost patient $3.00 but that patient will need to call on 10/07/16 so that this medication can be ordered and be available for pickup on 10/08/16 so that she may begin to take this on 10/10/16 after discharge from surgery.

## 2016-09-22 NOTE — Telephone Encounter (Signed)
Spoke with patient at this time. I explained to the patient that since we have not done surgery on the patient, we cannot prescribe narcotic pain medication for the pain.  Encouraged use of Ibuprofen up to 800mg  TID and to apply ice 3-4 times daily for 15-20 minutes each time. She verbalizes understanding of this.
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
## 2016-09-23 ENCOUNTER — Other Ambulatory Visit: Payer: Self-pay | Admitting: Family Medicine

## 2016-09-23 ENCOUNTER — Telehealth: Payer: Self-pay | Admitting: Surgery

## 2016-09-23 NOTE — Telephone Encounter (Signed)
Pt advised of pre op date/time and sx date. Sx: 10/09/16 with Dr Jaynie Bream umbilical hernia repair. Pre op: 10/02/16 @ 10:45am--office.   Patient made aware to call [phone], between 1-3:00pm the day before surgery, to find out what time to arrive.    Patient advised.

## 2016-09-23 NOTE — Telephone Encounter (Signed)
Called patient back and had to leave her a voicemail. Awaiting on her call back.

## 2016-09-23 NOTE — Telephone Encounter (Signed)
My chart told her about pre admit testing but she doesn't know what it's for. Please call.

## 2016-09-24 ENCOUNTER — Ambulatory Visit (INDEPENDENT_AMBULATORY_CARE_PROVIDER_SITE_OTHER): Payer: Medicaid Other

## 2016-09-24 ENCOUNTER — Ambulatory Visit: Payer: Medicaid Other

## 2016-09-24 DIAGNOSIS — Z3042 Encounter for surveillance of injectable contraceptive: Secondary | ICD-10-CM | POA: Diagnosis not present

## 2016-09-24 MED ORDER — MEDROXYPROGESTERONE ACETATE 150 MG/ML IM SUSP
150.0000 mg | Freq: Once | INTRAMUSCULAR | Status: AC
  Administered 2016-09-24: 150 mg via INTRAMUSCULAR

## 2016-09-24 NOTE — Telephone Encounter (Signed)
Last (acute) OV: 09/17/16 Last routine OV: 09/17/16 Next OV: 09/24/16

## 2016-09-24 NOTE — Telephone Encounter (Signed)
Patient was contacted. Please see additional note in chart.

## 2016-09-25 ENCOUNTER — Encounter: Payer: Self-pay | Admitting: Neurology

## 2016-10-01 ENCOUNTER — Inpatient Hospital Stay: Admission: RE | Admit: 2016-10-01 | Payer: Medicaid Other | Source: Ambulatory Visit

## 2016-10-02 ENCOUNTER — Ambulatory Visit: Payer: Medicaid Other | Admitting: Neurology

## 2016-10-02 ENCOUNTER — Other Ambulatory Visit: Payer: Medicaid Other

## 2016-10-05 ENCOUNTER — Encounter
Admission: RE | Admit: 2016-10-05 | Discharge: 2016-10-05 | Disposition: A | Discharge status: Home / Self Care | Payer: Medicaid Other | Source: Ambulatory Visit | Attending: Surgery | Admitting: Surgery

## 2016-10-05 ENCOUNTER — Telehealth: Payer: Self-pay

## 2016-10-05 DIAGNOSIS — Z832 Family history of diseases of the blood and blood-forming organs and certain disorders involving the immune mechanism: Secondary | ICD-10-CM | POA: Diagnosis not present

## 2016-10-05 DIAGNOSIS — Z01818 Encounter for other preprocedural examination: Secondary | ICD-10-CM | POA: Insufficient documentation

## 2016-10-05 DIAGNOSIS — E7212 Methylenetetrahydrofolate reductase deficiency: Secondary | ICD-10-CM | POA: Insufficient documentation

## 2016-10-05 DIAGNOSIS — Z79890 Hormone replacement therapy (postmenopausal): Secondary | ICD-10-CM | POA: Insufficient documentation

## 2016-10-05 NOTE — Telephone Encounter (Signed)
H&P will be updated the morning of surgery by Dr. Hampton Abbot with Lung assessment.   Dr. Hampton Abbot has been made aware.

## 2016-10-05 NOTE — Patient Instructions (Signed)
  Your procedure is scheduled on: Friday, October 09, 2016 Report to Same Day Surgery 2nd floor medical mall ([place] Entrance-take elevator on left to 2nd floor.  Check in with surgery information desk.) To find out your arrival time please call [phone] between 1PM - 3PM on Thursday, February 15th  Remember: Instructions that are not followed completely may result in serious medical risk, up to and including death, or upon the discretion of your surgeon and anesthesiologist your surgery may need to be rescheduled.    _x___ 1. Do not eat food or drink liquids after midnight. No gum chewing or hard candies.     __x__ 2. No Alcohol for 24 hours before or after surgery.   __x__3. No Smoking for 24 prior to surgery.   ____  4. Bring all medications with you on the day of surgery if instructed.    __x__ 5. Notify your doctor if there is any change in your medical condition     (cold, fever, infections).     Do not wear jewelry, make-up, hairpins, clips or nail polish.  Do not wear lotions, powders, or perfumes. You may wear deodorant.  Do not shave 48 hours prior to surgery. Men may shave face and neck.  Do not bring valuables to the hospital.    [facility] is not responsible for any belongings or valuables.               Contacts, dentures or bridgework may not be worn into surgery.  Leave your suitcase in the car. After surgery it may be brought to your room.  For patients admitted to the hospital, discharge time is determined by your treatment team.   Patients discharged the day of surgery will not be allowed to drive home.  You will need someone to drive you home and stay with you the night of your procedure.    Please read over the following fact sheets that you were given:   [facility] Preparing for Surgery and or MRSA Information   _x___ Take these medicines the morning of surgery with A SIP OF WATER:    1. CELEXA  2. METOPROLOL  3. ZANTAC (TAKE ONE TABLET THE  NIGHT PRIOR TO SURGERY AND ONE TABLET THE MORNING OF SURGERY)  4. LYRICA  5.  6.  ____Fleets enema or Magnesium Citrate as directed.   _x___ Use CHG Soap or sage wipes as directed on instruction sheet   ____ Use inhalers on the day of surgery and bring to hospital day of surgery  ____ Stop metformin 2 days prior to surgery    ____ Take 1/2 of usual insulin dose the night before surgery and none on the morning of           surgery.   __X__ Stop Aspirin, Coumadin, Pllavix ,Eliquis, Effient, or Pradaxa (BC POWDER)  x__ Stop Anti-inflammatories such as Advil, Aleve, Ibuprofen, Motrin, Naproxen,          Naprosyn, Goodies powders or aspirin products. Ok to take Tylenol.   ____ Stop supplements until after surgery.    ____ Bring C-Pap to the hospital.

## 2016-10-05 NOTE — Telephone Encounter (Signed)
Patient is having an umbilical Hernia repair on Friday with Dr. Hampton Abbot but the orders are not put in the System. Please contact Langley Gauss at Pre-admission testing at [phone] if you have any questions.

## 2016-10-05 NOTE — Telephone Encounter (Signed)
Ellen Jackson called again stating that the patients H&P will be out of date and her lung assessment will need to be done as well.

## 2016-10-09 ENCOUNTER — Ambulatory Visit: Payer: Medicaid Other | Admitting: Anesthesiology

## 2016-10-09 ENCOUNTER — Ambulatory Visit
Admission: RE | Admit: 2016-10-09 | Discharge: 2016-10-09 | Disposition: A | Discharge status: Home / Self Care | Payer: Medicaid Other | Source: Ambulatory Visit | Attending: Surgery | Admitting: Surgery

## 2016-10-09 ENCOUNTER — Encounter: Payer: Self-pay | Admitting: Anesthesiology

## 2016-10-09 ENCOUNTER — Encounter
Admission: RE | Disposition: A | Discharge status: Home / Self Care | Payer: Self-pay | Source: Ambulatory Visit | Attending: Surgery

## 2016-10-09 DIAGNOSIS — F419 Anxiety disorder, unspecified: Secondary | ICD-10-CM | POA: Insufficient documentation

## 2016-10-09 DIAGNOSIS — K429 Umbilical hernia without obstruction or gangrene: Secondary | ICD-10-CM | POA: Diagnosis not present

## 2016-10-09 DIAGNOSIS — Z91040 Latex allergy status: Secondary | ICD-10-CM | POA: Diagnosis not present

## 2016-10-09 DIAGNOSIS — Z885 Allergy status to narcotic agent status: Secondary | ICD-10-CM | POA: Diagnosis not present

## 2016-10-09 DIAGNOSIS — F17210 Nicotine dependence, cigarettes, uncomplicated: Secondary | ICD-10-CM | POA: Insufficient documentation

## 2016-10-09 DIAGNOSIS — M797 Fibromyalgia: Secondary | ICD-10-CM | POA: Diagnosis not present

## 2016-10-09 DIAGNOSIS — Q760 Spina bifida occulta: Secondary | ICD-10-CM | POA: Insufficient documentation

## 2016-10-09 DIAGNOSIS — Z880 Allergy status to penicillin: Secondary | ICD-10-CM | POA: Insufficient documentation

## 2016-10-09 DIAGNOSIS — Z888 Allergy status to other drugs, medicaments and biological substances status: Secondary | ICD-10-CM | POA: Diagnosis not present

## 2016-10-09 DIAGNOSIS — I471 Supraventricular tachycardia: Secondary | ICD-10-CM | POA: Insufficient documentation

## 2016-10-09 DIAGNOSIS — K589 Irritable bowel syndrome without diarrhea: Secondary | ICD-10-CM | POA: Insufficient documentation

## 2016-10-09 DIAGNOSIS — K219 Gastro-esophageal reflux disease without esophagitis: Secondary | ICD-10-CM | POA: Insufficient documentation

## 2016-10-09 DIAGNOSIS — E7212 Methylenetetrahydrofolate reductase deficiency: Secondary | ICD-10-CM | POA: Insufficient documentation

## 2016-10-09 HISTORY — PX: UMBILICAL HERNIA REPAIR: SHX196

## 2016-10-09 HISTORY — DX: Cardiac arrhythmia, unspecified: I49.9

## 2016-10-09 LAB — POCT PREGNANCY, URINE: PREG TEST UR: NEGATIVE

## 2016-10-09 SURGERY — REPAIR, HERNIA, UMBILICAL, ADULT
Anesthesia: General

## 2016-10-09 MED ORDER — MIDAZOLAM HCL 2 MG/2ML IJ SOLN
INTRAMUSCULAR | Status: DC | PRN
  Administered 2016-10-09: 2 mg via INTRAVENOUS

## 2016-10-09 MED ORDER — BUPIVACAINE LIPOSOME 1.3 % IJ SUSP
INTRAMUSCULAR | Status: AC
  Filled 2016-10-09: qty 20

## 2016-10-09 MED ORDER — FENTANYL CITRATE (PF) 100 MCG/2ML IJ SOLN
INTRAMUSCULAR | Status: AC
  Filled 2016-10-09: qty 2

## 2016-10-09 MED ORDER — DIPHENHYDRAMINE HCL 50 MG/ML IJ SOLN
INTRAMUSCULAR | Status: AC
  Filled 2016-10-09: qty 1

## 2016-10-09 MED ORDER — CLINDAMYCIN PHOSPHATE 900 MG/50ML IV SOLN
INTRAVENOUS | Status: AC
  Administered 2016-10-09: 900 mg via INTRAVENOUS
  Filled 2016-10-09: qty 50

## 2016-10-09 MED ORDER — MIDAZOLAM HCL 2 MG/2ML IJ SOLN
INTRAMUSCULAR | Status: AC
  Filled 2016-10-09: qty 2

## 2016-10-09 MED ORDER — OXYCODONE HCL 5 MG PO TABS: 5.0000 mg | ORAL_TABLET | ORAL | 0 refills | Status: DC | PRN

## 2016-10-09 MED ORDER — GLYCOPYRROLATE 0.2 MG/ML IJ SOLN
INTRAMUSCULAR | Status: AC
  Filled 2016-10-09: qty 1

## 2016-10-09 MED ORDER — DIPHENHYDRAMINE HCL 50 MG/ML IJ SOLN
INTRAMUSCULAR | Status: DC | PRN
  Administered 2016-10-09: 12.5 mg via INTRAVENOUS

## 2016-10-09 MED ORDER — DEXAMETHASONE SODIUM PHOSPHATE 10 MG/ML IJ SOLN
INTRAMUSCULAR | Status: AC
  Filled 2016-10-09: qty 1

## 2016-10-09 MED ORDER — SODIUM CHLORIDE 0.9 % IJ SOLN
INTRAMUSCULAR | Status: AC
  Filled 2016-10-09: qty 50

## 2016-10-09 MED ORDER — PROPOFOL 10 MG/ML IV BOLUS
INTRAVENOUS | Status: DC | PRN
  Administered 2016-10-09: 50 mg via INTRAVENOUS
  Administered 2016-10-09: 150 mg via INTRAVENOUS

## 2016-10-09 MED ORDER — BACITRACIN ZINC 500 UNIT/GM EX OINT
TOPICAL_OINTMENT | CUTANEOUS | Status: DC | PRN
  Administered 2016-10-09: 1 via TOPICAL

## 2016-10-09 MED ORDER — CLINDAMYCIN PHOSPHATE 900 MG/50ML IV SOLN
900.0000 mg | Freq: Once | INTRAVENOUS | Status: AC
  Administered 2016-10-09: 900 mg via INTRAVENOUS

## 2016-10-09 MED ORDER — DEXAMETHASONE SODIUM PHOSPHATE 10 MG/ML IJ SOLN
INTRAMUSCULAR | Status: DC | PRN
  Administered 2016-10-09: 10 mg via INTRAVENOUS

## 2016-10-09 MED ORDER — BUPIVACAINE HCL (PF) 0.5 % IJ SOLN
INTRAMUSCULAR | Status: AC
  Filled 2016-10-09: qty 30

## 2016-10-09 MED ORDER — CHLORHEXIDINE GLUCONATE CLOTH 2 % EX PADS: 6.0000 | MEDICATED_PAD | Freq: Every day | CUTANEOUS | Status: DC

## 2016-10-09 MED ORDER — FENTANYL CITRATE (PF) 100 MCG/2ML IJ SOLN
25.0000 ug | INTRAMUSCULAR | Status: DC | PRN
  Administered 2016-10-09 (×4): 25 ug via INTRAVENOUS

## 2016-10-09 MED ORDER — LIDOCAINE HCL (PF) 2 % IJ SOLN
INTRAMUSCULAR | Status: AC
  Filled 2016-10-09: qty 2

## 2016-10-09 MED ORDER — PROPOFOL 10 MG/ML IV BOLUS
INTRAVENOUS | Status: AC
  Filled 2016-10-09: qty 20

## 2016-10-09 MED ORDER — ACETAMINOPHEN 500 MG PO TABS
1000.0000 mg | ORAL_TABLET | Freq: Once | ORAL | Status: AC
  Administered 2016-10-09: 1000 mg via ORAL

## 2016-10-09 MED ORDER — ACETAMINOPHEN 500 MG PO TABS
ORAL_TABLET | ORAL | Status: AC
  Administered 2016-10-09: 1000 mg via ORAL
  Filled 2016-10-09: qty 2

## 2016-10-09 MED ORDER — FENTANYL CITRATE (PF) 100 MCG/2ML IJ SOLN
INTRAMUSCULAR | Status: DC | PRN
  Administered 2016-10-09: 100 ug via INTRAVENOUS
  Administered 2016-10-09 (×2): 25 ug via INTRAVENOUS
  Administered 2016-10-09: 50 ug via INTRAVENOUS

## 2016-10-09 MED ORDER — BUPIVACAINE LIPOSOME 1.3 % IJ SUSP
INTRAMUSCULAR | Status: DC | PRN
  Administered 2016-10-09: 50 mL

## 2016-10-09 MED ORDER — LIDOCAINE HCL (CARDIAC) 20 MG/ML IV SOLN
INTRAVENOUS | Status: DC | PRN
  Administered 2016-10-09: 50 mg via INTRAVENOUS

## 2016-10-09 MED ORDER — IBUPROFEN 600 MG PO TABS: 600.0000 mg | ORAL_TABLET | Freq: Three times a day (TID) | ORAL | 0 refills | Status: DC | PRN

## 2016-10-09 MED ORDER — GLYCOPYRROLATE 0.2 MG/ML IJ SOLN
INTRAMUSCULAR | Status: DC | PRN
  Administered 2016-10-09: 0.2 mg via INTRAVENOUS

## 2016-10-09 MED ORDER — BUPIVACAINE LIPOSOME 1.3 % IJ SUSP: 20.0000 mL | INTRAMUSCULAR | Status: DC

## 2016-10-09 MED ORDER — KETOROLAC TROMETHAMINE 30 MG/ML IJ SOLN
INTRAMUSCULAR | Status: DC | PRN
  Administered 2016-10-09: 30 mg via INTRAVENOUS

## 2016-10-09 MED ORDER — BACITRACIN ZINC 500 UNIT/GM EX OINT
TOPICAL_OINTMENT | CUTANEOUS | Status: AC
  Filled 2016-10-09: qty 0.9

## 2016-10-09 MED ORDER — LACTATED RINGERS IV SOLN
INTRAVENOUS | Status: DC
Start: 2016-10-09 — End: 2016-10-09
  Administered 2016-10-09: 75 mL/h via INTRAVENOUS

## 2016-10-09 MED ORDER — KETOROLAC TROMETHAMINE 30 MG/ML IJ SOLN
INTRAMUSCULAR | Status: AC
  Filled 2016-10-09: qty 1

## 2016-10-09 MED ORDER — BUPIVACAINE-EPINEPHRINE (PF) 0.5% -1:200000 IJ SOLN
INTRAMUSCULAR | Status: AC
  Filled 2016-10-09: qty 30

## 2016-10-09 MED ORDER — FENTANYL CITRATE (PF) 100 MCG/2ML IJ SOLN
INTRAMUSCULAR | Status: AC
Start: 2016-10-09 — End: 2016-10-09
  Administered 2016-10-09: 25 ug via INTRAVENOUS
  Filled 2016-10-09: qty 2

## 2016-10-09 MED ORDER — EPHEDRINE SULFATE 50 MG/ML IJ SOLN
INTRAMUSCULAR | Status: DC | PRN
  Administered 2016-10-09 (×2): 5 mg via INTRAVENOUS
  Administered 2016-10-09: 10 mg via INTRAVENOUS
  Administered 2016-10-09: 5 mg via INTRAVENOUS

## 2016-10-09 SURGICAL SUPPLY — 28 items
BLADE CLIPPER SURG (BLADE) IMPLANT
BLADE SURG 15 STRL LF DISP TIS (BLADE) ×1 IMPLANT
BLADE SURG 15 STRL SS (BLADE) ×2
CANISTER SUCT 1200ML W/VALVE (MISCELLANEOUS) ×3 IMPLANT
CHLORAPREP W/TINT 26ML (MISCELLANEOUS) ×3 IMPLANT
DERMABOND ADVANCED (GAUZE/BANDAGES/DRESSINGS)
DERMABOND ADVANCED .7 DNX12 (GAUZE/BANDAGES/DRESSINGS) IMPLANT
DRAPE PED LAPAROTOMY (DRAPES) ×3 IMPLANT
DRSG TELFA 3X8 NADH (GAUZE/BANDAGES/DRESSINGS) ×3 IMPLANT
ELECT REM PT RETURN 9FT ADLT (ELECTROSURGICAL) ×3
ELECTRODE REM PT RTRN 9FT ADLT (ELECTROSURGICAL) ×1 IMPLANT
GLOVE BIOGEL PI IND STRL 7.5 (GLOVE) ×3 IMPLANT
GLOVE BIOGEL PI INDICATOR 7.5 (GLOVE) ×6
GLOVE PROTEXIS LATEX SZ 7.5 (GLOVE) IMPLANT
GLOVE SKINSENSE NS SZ7.0 (GLOVE) ×6
GLOVE SKINSENSE STRL SZ7.0 (GLOVE) ×3 IMPLANT
GLOVE SURG SYN 7.0 (GLOVE) IMPLANT
GOWN STRL REUS W/ TWL LRG LVL3 (GOWN DISPOSABLE) ×4 IMPLANT
GOWN STRL REUS W/TWL LRG LVL3 (GOWN DISPOSABLE) ×8
MESH VENTRALEX ST 8CM LRG (Mesh General) ×3 IMPLANT
NEEDLE HYPO 22GX1.5 SAFETY (NEEDLE) ×3 IMPLANT
NS IRRIG 500ML POUR BTL (IV SOLUTION) ×3 IMPLANT
PACK BASIN MINOR ARMC (MISCELLANEOUS) ×3 IMPLANT
SUT ETHIBOND 0 MO6 C/R (SUTURE) ×3 IMPLANT
SUT MNCRL AB 4-0 PS2 18 (SUTURE) ×3 IMPLANT
SUT VIC AB 3-0 SH 27 (SUTURE) ×2
SUT VIC AB 3-0 SH 27X BRD (SUTURE) ×1 IMPLANT
SYR 20CC LL (SYRINGE) ×3 IMPLANT

## 2016-10-09 NOTE — Op Note (Addendum)
Procedure Date:  10/09/2016  Pre-operative Diagnosis:  Recurrent umbilical incisional hernia  Post-operative Diagnosis:  Recurrent umbilical incisional hernia  Procedure:  Umbilical hernia repair with mesh  Surgeon:  Melvyn Neth, MD  Anesthesia:  General endotracheal  Estimated Blood Loss:  10 ml  Specimens:  Prior hernia mesh  Complications:  None  Indications for Procedure:  This is a 25 y.o. female who presents with a recurrent umbilical hernia, s/p prior repair in 2015.  The risks of bleeding, abscess or infection, injury to surrounding structures, and need for further procedures were all discussed with the patient and was willing to proceed.  Description of Procedure: The patient was correctly identified in the preoperative area and brought into the operating room.  The patient was placed supine with VTE prophylaxis in place.  Appropriate time-outs were performed.  Anesthesia was induced and the patient was intubated.  Appropriate antibiotics were infused.  The abdomen was prepped and draped in a sterile fashion.  A vertical supraumbilical incision was made.  Electrocautery was used to dissect down to the level of the fascia.  A small fascial defect was noted in supra-umbilical area.  After freeing the subcutaneous tissue from the fascial edges, an additional two small defects were noted in the fascia.  The skin incision was extended inferiorly for better exposure.  Her prior mesh repair was seen and appeared to have been unattached from the right side edge of the fascia.  The mesh was freed and cut out of the field.  The defects were combined into a single one, measuring 4 cm in diameter.  Any internal adhesions to the abdominal wall were taken down with electrocautery, clearing a field for the mesh to be placed in underlay fashion.  An 8 cm Bard Ventralex ST hernia patch was brought into the field and placed under the peritoneum.  The mesh tails were secured to the fascia with 0  Ethibond sutures.  The fascia was then closed incorporating the mesh using figure of eight 0 Ethibond sutures.  The subcutaneous cavity was irrigated and hemostasis was achieved with cautery.  The umbilical stalk was sutured to the fascia using 3-0 Vicryl.  The cavity was then closed in multiple layers using 3-0 Vicryl sutures.  The skin was closed using a running 3-0 Nylon suture.  Bacitracin was applied to the wound and dressed with dry gauze and tape.  The patient was emerged from anesthesia and extubated and brought to the recovery room for further management.  The patient tolerated the procedure well and all counts were correct at the end of the case.   Melvyn Neth, MD

## 2016-10-09 NOTE — Anesthesia Post-op Follow-up Note (Cosign Needed)
Anesthesia QCDR form completed.        

## 2016-10-09 NOTE — Anesthesia Procedure Notes (Signed)
Procedure Name: LMA Insertion Date/Time: 10/09/2016 12:08 PM Performed by: Letitia Neri Pre-anesthesia Checklist: Patient identified, Emergency Drugs available, Suction available, Patient being monitored and Timeout performed Patient Re-evaluated:Patient Re-evaluated prior to inductionOxygen Delivery Method: Circle system utilized Preoxygenation: Pre-oxygenation with 100% oxygen Intubation Type: IV induction Ventilation: Mask ventilation without difficulty LMA: LMA inserted LMA Size: 3.5 Number of attempts: 1 Placement Confirmation: positive ETCO2 and breath sounds checked- equal and bilateral Tube secured with: Tape

## 2016-10-09 NOTE — Interval H&P Note (Signed)
History and Physical Interval Note:  10/09/2016 11:43 AM  Ellen Jackson  has presented today for surgery, with the diagnosis of recurrent umbilical hernia   The various methods of treatment have been discussed with the patient and family. After consideration of risks, benefits and other options for treatment, the patient has consented to  Procedure(s): HERNIA REPAIR UMBILICAL ADULT (N/A) as a surgical intervention .  The patient's history has been reviewed, patient examined, no change in status, stable for surgery.  I have reviewed the patient's chart and labs.  Questions were answered to the patient's satisfaction.     Erhard Senske

## 2016-10-09 NOTE — H&P (Signed)
10/09/2016  History of Present Illness: Ellen Jackson is a 26 y.o. female who presents with a recurrent umbilical hernia.  She's s/p umbilical hernia repair on 12/2013 with Dr. Marina Gravel.  She developed a recurrent hernia, at the superior portion of the prior repair as noted on CT scan.  She presents for hernia repair today.  She was seen by Hematology for a MTHFR mutation, and she has a prescription already for Lovenox 40 mg [place] daily x 7 days.  Reports having abdominal pain at the hernia site and has been taking tylenol this week for pain control.  Past Medical History: Past Medical History:  Diagnosis Date  . Allergy   . Anxiety   . Dysrhythmia   . Fibromyalgia    everywhere  . GERD (gastroesophageal reflux disease)   . Hernia, umbilical   . Heterozygous MTHFR mutation C677T ([place])   . IBS (irritable bowel syndrome)   . Migraine with aura    bc powders is only thing that works.  . Ovarian cyst   . Spina bifida occulta   . SVT (supraventricular tachycardia) ([facility])      Past Surgical History: Past Surgical History:  Procedure Laterality Date  . APPENDECTOMY  2015  . CESAREAN SECTION  2011  . CHOLECYSTECTOMY  2005  . HERNIA REPAIR  [id number]   Umbilical Hernia Repair with mesh  . KNEE ARTHROSCOPY Left 2015   X 3. plate and pin in left shin placed after several surgeries. last surgery for this was 20154  . OVARIAN CYST SURGERY      Home Medications: Prior to Admission medications   Medication Sig Start Date End Date Taking? Authorizing Provider  Aspirin-Salicylamide-Caffeine (BC FAST PAIN RELIEF) [phone] MG PACK Take 1 packet by mouth every 8 (eight) hours as needed (pain).    Yes Historical Provider, MD  citalopram (CELEXA) 40 MG tablet TAKE 1 AND 1/2 TABLETS(60 MG) BY MOUTH DAILY 09/24/16  Yes Megan P Johnson, DO  ibuprofen (ADVIL,MOTRIN) 200 MG tablet Take 800 mg by mouth every 8 (eight) hours as needed for moderate pain.    Yes Historical Provider, MD  medroxyPROGESTERone  (DEPO-PROVERA) 150 MG/ML injection Inject 150 mg into the muscle every 3 (three) months. Next injection due 12-2016   Yes Historical Provider, MD  metoprolol succinate (TOPROL XL) 25 MG 24 hr tablet Take 25 mg by mouth daily.   Yes Historical Provider, MD  polyethylene glycol (MIRALAX / GLYCOLAX) packet Take 17 g by mouth 2 (two) times daily as needed for mild constipation.    Yes Historical Provider, MD  pregabalin (LYRICA) 150 MG capsule Take 1 capsule (150 mg total) by mouth 2 (two) times daily. 09/17/16  Yes Megan P Johnson, DO  ranitidine (ZANTAC) 150 MG tablet Take 1 tablet (150 mg total) by mouth daily as needed for heartburn. 07/02/16  Yes Megan Annia Friendly, DO    Allergies: Allergies  Allergen Reactions  . Codeine     Other reaction(s): Other (See Comments) Passes out  . Latex Itching and Rash    Lips itching  . Morphine Itching and Shortness Of Breath  . Nalbuphine Itching and Shortness Of Breath  . Ondansetron Hcl Hives and Rash  . Other Hives    Allergic to steri strips. tegaderm ok.. Paper tape is ok. Clear plastic tape rips off skin  . Penicillins Rash    Patient reports severe rash and itching. Has patient had a PCN reaction causing immediate rash, facial/tongue/throat swelling, SOB or lightheadedness with hypotension:  Yes Has patient had a PCN reaction causing severe rash involving mucus membranes or skin necrosis: No Has patient had a PCN reaction that required hospitalization unknown Has patient had a PCN reaction occurring within the last 10 years:  unknown If all of the above answers are "NO", then may proceed  . Metoclopramide Anxiety    Other reaction(s): Other (See Comments) "jittery" Difficulty breathing  . Prochlorperazine Anxiety and Other (See Comments)    Difficulty breathing  . Promethazine Anxiety and Other (See Comments)    "jittery" Difficulty breathing     . Tape     See above ... Paper tape ok  . Tizanidine Other (See Comments)    Worsens  headache    Social History:  reports that she has been smoking Cigarettes.  She has a 1.50 pack-year smoking history. She has never used smokeless tobacco. She reports that she does not drink alcohol or use drugs.   Family History: Family History  Problem Relation Age of Onset  . Arthritis Mother   . Hyperlipidemia Mother   . Hypertension Mother   . Migraines Mother   . Arthritis Father   . Autism Son   . Cancer Maternal Grandfather     pancreatic  . Stroke Maternal Grandfather   . Diabetes Paternal Grandmother     Review of Systems: Review of Systems  Constitutional: Negative for chills and fever.  HENT: Negative for hearing loss.   Eyes: Negative for blurred vision.  Respiratory: Negative for cough and shortness of breath.   Cardiovascular: Negative for chest pain.  Gastrointestinal: Positive for abdominal pain and constipation. Negative for heartburn, nausea and vomiting.  Genitourinary: Negative for dysuria and hematuria.  Musculoskeletal: Negative for myalgias.  Skin: Negative for rash.  Neurological: Negative for dizziness.  Psychiatric/Behavioral: Negative for depression.  All other systems reviewed and are negative.   Physical Exam BP 111/74   Pulse 79   Temp 98 F (36.7 C) (Oral)   Resp (!) 79   Ht 5' 7.25" (1.708 m)   Wt 70.5 kg (155 lb 6 oz)   SpO2 94%   BMI 24.15 kg/m  CONSTITUTIONAL: No acute distress HEENT:  Normocephalic, atraumatic, extraocular motion intact. NECK: Trachea is midline, and there is no jugular venous distension.  RESPIRATORY:  Lungs are clear, and breath sounds are equal bilaterally. Normal respiratory effort without pathologic use of accessory muscles. CARDIOVASCULAR: Heart is regular without murmurs, gallops, or rubs. GI: The abdomen is soft, nondistended, with mild tenderness to palpation over the umbilical hernia site.  No skin discoloration or ulceration. MUSCULOSKELETAL:  Normal muscle strength and tone in all four  extremities.  No peripheral edema or cyanosis. SKIN: Skin turgor is normal. There are no pathologic skin lesions.  NEUROLOGIC:  Motor and sensation is grossly normal.  Cranial nerves are grossly intact. PSYCH:  Alert and oriented to person, place and time. Affect is normal.  Labs/Imaging: CT scan on 09/01/16 shows a probable small recurrent ventral hernia at the superior margin of the umbilical hernia repair.  Assessment and Plan: This is a 26 y.o. female who presents with a recurrent umbilical hernia, s/p prior repair in 2015.  Patient will undergo an umbilical hernia repair today.  Risks and benefits of the procedure had been discussed with the patient and all questions answered.  She is still interested in repair and will proceed today.  She mentions allergy to adhesives and prefers no steri strips or dermabond.  Will close her incision with nylon sutures  instead.  Patient understands this and is in agreement.   Melvyn Neth, [place]

## 2016-10-09 NOTE — Transfer of Care (Signed)
Immediate Anesthesia Transfer of Care Note  Patient: Ellen Jackson  Procedure(s) Performed: Procedure(s): HERNIA REPAIR UMBILICAL ADULT (N/A)  Patient Location: PACU  Anesthesia Type:General  Level of Consciousness: sedated  Airway & Oxygen Therapy: Patient Spontanous Breathing  Post-op Assessment: Report given to RN and Post -op Vital signs reviewed and stable  Post vital signs: Reviewed and stable  Last Vitals:  Vitals:   10/09/16 1100 10/09/16 1356  BP: 111/74 126/83  Pulse: 79 (!) 107  Resp: (!) 79 12  Temp: 36.7 C 37 C    Last Pain:  Vitals:   10/09/16 1104  TempSrc:   PainSc: 6       Patients Stated Pain Goal: 0 ([id number] [phone])  Complications: No apparent anesthesia complications

## 2016-10-09 NOTE — Anesthesia Preprocedure Evaluation (Addendum)
Anesthesia Evaluation  Patient identified by MRN, date of birth, ID band Patient awake    Reviewed: Allergy & Precautions, NPO status , Patient's Chart, lab work & pertinent test results, reviewed documented beta blocker date and time   Airway Mallampati: II  TM Distance: >3 FB     Dental  (+) Chipped   Pulmonary Current Smoker,           Cardiovascular      Neuro/Psych  Headaches,    GI/Hepatic GERD  Controlled,  Endo/Other    Renal/GU      Musculoskeletal  (+) Fibromyalgia -  Abdominal   Peds  Hematology   Anesthesia Other Findings Hx of SVT. Allergic to zofran, phenergan.  Reproductive/Obstetrics                            Anesthesia Physical Anesthesia Plan  ASA: III  Anesthesia Plan: General   Post-op Pain Management:    Induction: Intravenous  Airway Management Planned: Oral ETT and LMA  Additional Equipment:   Intra-op Plan:   Post-operative Plan:   Informed Consent: I have reviewed the patients History and Physical, chart, labs and discussed the procedure including the risks, benefits and alternatives for the proposed anesthesia with the patient or authorized representative who has indicated his/her understanding and acceptance.     Plan Discussed with: CRNA  Anesthesia Plan Comments:         Anesthesia Quick Evaluation

## 2016-10-09 NOTE — Discharge Instructions (Signed)

## 2016-10-12 ENCOUNTER — Encounter: Payer: Self-pay | Admitting: Surgery

## 2016-10-12 LAB — SURGICAL PATHOLOGY

## 2016-10-12 NOTE — Anesthesia Postprocedure Evaluation (Signed)
Anesthesia Post Note  Patient: Ellen Jackson  Procedure(s) Performed: Procedure(s) (LRB): HERNIA REPAIR UMBILICAL ADULT (N/A)  Patient location during evaluation: PACU Anesthesia Type: General Level of consciousness: awake and alert and oriented Pain management: pain level controlled Vital Signs Assessment: post-procedure vital signs reviewed and stable Respiratory status: spontaneous breathing Cardiovascular status: blood pressure returned to baseline Anesthetic complications: no     Last Vitals:  Vitals:   10/09/16 1426 10/09/16 1438  BP: 123/77 121/68  Pulse: 96 89  Resp: 14 16  Temp: 36.7 C 37.1 C    Last Pain:  Vitals:   10/12/16 0822  TempSrc:   PainSc: 0-No pain                 Aysa Larivee

## 2016-10-14 DIAGNOSIS — R0609 Other forms of dyspnea: Secondary | ICD-10-CM

## 2016-10-14 DIAGNOSIS — R0600 Dyspnea, unspecified: Secondary | ICD-10-CM

## 2016-10-14 HISTORY — DX: Other forms of dyspnea: R06.09

## 2016-10-14 HISTORY — DX: Dyspnea, unspecified: R06.00

## 2016-10-15 ENCOUNTER — Telehealth: Payer: Self-pay

## 2016-10-15 NOTE — Telephone Encounter (Signed)
Patient called today stating that she has been taking the Oxycodone 5 MG 1 tablet every 4 hours due to having pain all the time. She stated that she still has 7 tablets left. I asked if there was any way that she could take Ibuprofen in between and she stated that she was taking a blood thinner as well. Therefore, she was calling to see if she could take anything else for pain since the Oxycodone is not taking care of the pain. I told her that I would have to speak with one of our surgeons and then I would call her back. Patient has an appointment to see Dr. Hampton Abbot on 10/19/2016. Patient had her Umbilical Hernia Repaired on 10/09/2016.

## 2016-10-16 MED ORDER — CYCLOBENZAPRINE HCL 10 MG PO TABS: 10.0000 mg | ORAL_TABLET | Freq: Three times a day (TID) | ORAL | 0 refills | Status: DC | PRN

## 2016-10-16 MED ORDER — PREGABALIN 150 MG PO CAPS: 150.0000 mg | ORAL_CAPSULE | Freq: Three times a day (TID) | ORAL | 0 refills | Status: DC

## 2016-10-16 NOTE — Telephone Encounter (Signed)
Called patient to let her know that Dr. Dahlia Byes (our surgeon in the office) was able to prescribe her a medication that would work for her. I told her that the prescription was left at the front desk for her to pick up. Patient understood and had no further questions.

## 2016-10-16 NOTE — Telephone Encounter (Signed)
Ellen Jackson is calling back to see if we see what the surgeon said about her pain medications. Please call patient and advice.

## 2016-10-19 ENCOUNTER — Ambulatory Visit: Payer: Self-pay | Admitting: Surgery

## 2016-10-19 ENCOUNTER — Encounter: Payer: Self-pay | Admitting: Surgery

## 2016-10-19 ENCOUNTER — Ambulatory Visit (INDEPENDENT_AMBULATORY_CARE_PROVIDER_SITE_OTHER): Payer: Medicaid Other | Admitting: Surgery

## 2016-10-19 VITALS — BP 111/77 | HR 88 | Ht 67.0 in | Wt 160.8 lb

## 2016-10-19 DIAGNOSIS — Z09 Encounter for follow-up examination after completed treatment for conditions other than malignant neoplasm: Secondary | ICD-10-CM

## 2016-10-19 DIAGNOSIS — K429 Umbilical hernia without obstruction or gangrene: Secondary | ICD-10-CM

## 2016-10-19 NOTE — Progress Notes (Signed)
10/19/2016  HPI: Patient is status post recurrent umbilical hernia repair with mesh on 2/16. Patient reports that her abdominal pain and discomfort has significantly improved since his surgery compared to preop. She was given a prescription for Flexeril last week which has also helped with her discomfort. He has any fevers, chills, chest pain, shortness of breath. Denies any drainage from the incision or opening of the wound.  Vital signs: BP 111/77   Pulse 88   Ht 5\' 7"  (1.702 m)   Wt 72.9 kg (160 lb 12.8 oz)   BMI 25.18 kg/m    Physical Exam: Constitutional: No acute distress Abdomen: Soft, nondistended, nontender to palpation. Incision is clean dry and intact with no evidence of infection and only some mild surrounding ecchymosis. Nylon sutures intact and in place.  Assessment/Plan: 26 year old female status post recurrent umbilical hernia repair with mesh.  -Have removed the nylon suture today and replaced with dry gauze dressing with tape. No Steri-Strips placed due to allergy to the adhesive. -Patient still has a no heavy lifting restriction of more than 10-15 pounds until 3/16. She may return to full duties at that point and start working out again at that point. -Patient may follow-up on an as-needed basis.   Melvyn Neth, [place]

## 2016-10-19 NOTE — Patient Instructions (Addendum)
Please call our office if you have any questions or concerns. Please wait 4 weeks 11/06/16 before beginning any exercises.

## 2016-10-24 ENCOUNTER — Other Ambulatory Visit: Payer: Self-pay | Admitting: Family Medicine

## 2016-11-10 ENCOUNTER — Ambulatory Visit (INDEPENDENT_AMBULATORY_CARE_PROVIDER_SITE_OTHER): Payer: Medicaid Other | Admitting: General Surgery

## 2016-11-10 ENCOUNTER — Telehealth: Payer: Self-pay

## 2016-11-10 ENCOUNTER — Encounter: Payer: Self-pay | Admitting: General Surgery

## 2016-11-10 VITALS — BP 120/80 | HR 93 | Temp 98.3°F | Ht 67.0 in | Wt 168.4 lb

## 2016-11-10 DIAGNOSIS — Z4889 Encounter for other specified surgical aftercare: Secondary | ICD-10-CM

## 2016-11-10 NOTE — Progress Notes (Signed)
Outpatient Surgical Follow Up  11/10/2016  Ellen Jackson is an 26 y.o. female.   Chief Complaint  Patient presents with  . Routine Post Op    Open Umbilical Hernia Repair (10/09/16)- Dr. Hampton Abbot    HPI: 26 year old female one month status post open umbilical hernia repair. Called clinic today due to concerns of her wound with drainage. Patient reports that she has been unable to change the dressing because when she put a dressing on she can get off. She denies any fevers, chills, nausea, vomiting, chest pain, shortness of breath. She is mostly just worried about her incision.  Past Medical History:  Diagnosis Date  . Allergy   . Anxiety   . Dysrhythmia   . Fibromyalgia    everywhere  . GERD (gastroesophageal reflux disease)   . Hernia, umbilical   . Heterozygous MTHFR mutation C677T ([place])   . IBS (irritable bowel syndrome)   . Migraine with aura    bc powders is only thing that works.  . Ovarian cyst   . Spina bifida occulta   . SVT (supraventricular tachycardia) ([facility])     Past Surgical History:  Procedure Laterality Date  . APPENDECTOMY  2015  . CESAREAN SECTION  2011  . CHOLECYSTECTOMY  2005  . HERNIA REPAIR  0258   Umbilical Hernia Repair with mesh  . KNEE ARTHROSCOPY Left 2015   X 3. plate and pin in left shin placed after several surgeries. last surgery for this was 20154  . OVARIAN CYST SURGERY    . UMBILICAL HERNIA REPAIR N/A 10/09/2016   Procedure: HERNIA REPAIR UMBILICAL ADULT;  Surgeon: Olean Ree, MD;  Location: [facility] ORS;  Service: General;  Laterality: N/A;    Family History  Problem Relation Age of Onset  . Arthritis Mother   . Hyperlipidemia Mother   . Hypertension Mother   . Migraines Mother   . Arthritis Father   . Autism Son   . Cancer Maternal Grandfather     pancreatic  . Stroke Maternal Grandfather   . Diabetes Paternal Grandmother     Social History:  reports that she has been smoking Cigarettes.  She has a 1.50 pack-year smoking  history. She has never used smokeless tobacco. She reports that she does not drink alcohol or use drugs.  Allergies:  Allergies  Allergen Reactions  . Codeine     Other reaction(s): Other (See Comments) Passes out  . Latex Itching and Rash    Lips itching  . Morphine Itching and Shortness Of Breath  . Nalbuphine Itching and Shortness Of Breath  . Ondansetron Hcl Hives and Rash  . Other Hives    Allergic to steri strips. tegaderm ok.. Paper tape is ok. Clear plastic tape rips off skin  . Penicillins Rash    Patient reports severe rash and itching. Has patient had a PCN reaction causing immediate rash, facial/tongue/throat swelling, SOB or lightheadedness with hypotension: Yes Has patient had a PCN reaction causing severe rash involving mucus membranes or skin necrosis: No Has patient had a PCN reaction that required hospitalization unknown Has patient had a PCN reaction occurring within the last 10 years:  unknown If all of the above answers are "NO", then may proceed  . Metoclopramide Anxiety    Other reaction(s): Other (See Comments) "jittery" Difficulty breathing  . Prochlorperazine Anxiety and Other (See Comments)    Difficulty breathing  . Promethazine Anxiety and Other (See Comments)    "jittery" Difficulty breathing     .  Tape     See above ... Paper tape ok  . Tizanidine Other (See Comments)    Worsens headache    Medications reviewed.    ROS A multipoint review of systems was completed, all pertinent positives and negatives are documented in the history of present illness and remainder are negative.   BP 120/80   Pulse 93   Temp 98.3 F (36.8 C) (Oral)   Ht 5\' 7"  (1.702 m)   Wt 76.4 kg (168 lb 6.4 oz)   BMI 26.38 kg/m   Physical Exam Gen.: No acute distress Chest: Clear to auscultation Heart: Regular rhythm Abdomen: Soft, nontender, nondistended. Well approximated open umbilical hernia incision site with a 2 mm opening in the most cephalad aspect.  It does not probe deeper than the superficial subcutaneous. There is no spreading erythema or signs of infection.    No results found for this or any previous visit (from the past 48 hour(s)). No results found.  Assessment/Plan:  1. Aftercare following surgery 26 year old female status post open umbilical hernia repair. 2 mm disruption in the skin at the cephalad aspect. Discussed keeping a simple dressing over the top of this. She is to return to clinic immediately for any signs of infection otherwise she'll follow-up in clinic in 2 weeks for 1 additional wound check.     Clayburn Pert, MD FACS General Surgeon  11/10/2016,9:22 AM

## 2016-11-10 NOTE — Patient Instructions (Signed)
We will see you back in 2 weeks. Please see appointment below.  If you have any questions or concerns prior to your appointment, please give [place] a call.

## 2016-11-10 NOTE — Telephone Encounter (Signed)
Patient called in and states that she is having thick yellow drainage from the top of her umbilical incision. Spoke with Dr. Adonis Huguenin. He would like to see patient this morning. Patient placed on schedule.

## 2016-11-17 ENCOUNTER — Ambulatory Visit (INDEPENDENT_AMBULATORY_CARE_PROVIDER_SITE_OTHER): Payer: Medicaid Other | Admitting: Family Medicine

## 2016-11-17 ENCOUNTER — Telehealth: Payer: Self-pay | Admitting: Family Medicine

## 2016-11-17 ENCOUNTER — Encounter: Payer: Self-pay | Admitting: Family Medicine

## 2016-11-17 VITALS — BP 104/68 | HR 92 | Temp 97.9°F | Resp 17 | Ht 67.0 in | Wt 171.0 lb

## 2016-11-17 DIAGNOSIS — R0781 Pleurodynia: Secondary | ICD-10-CM

## 2016-11-17 DIAGNOSIS — J320 Chronic maxillary sinusitis: Secondary | ICD-10-CM

## 2016-11-17 MED ORDER — DOXYCYCLINE HYCLATE 100 MG PO TABS: 100.0000 mg | ORAL_TABLET | Freq: Two times a day (BID) | ORAL | 0 refills | Status: DC

## 2016-11-17 MED ORDER — ALBUTEROL SULFATE HFA 108 (90 BASE) MCG/ACT IN AERS: 2.0000 | INHALATION_SPRAY | Freq: Four times a day (QID) | RESPIRATORY_TRACT | 0 refills | Status: DC | PRN

## 2016-11-17 MED ORDER — BENZONATATE 200 MG PO CAPS: 200.0000 mg | ORAL_CAPSULE | Freq: Two times a day (BID) | ORAL | 0 refills | Status: DC | PRN

## 2016-11-17 NOTE — Telephone Encounter (Signed)
Spoke with patient and she confirmed that Rx is at the pharmacy.

## 2016-11-17 NOTE — Progress Notes (Signed)
BP 104/68 (BP Location: Left Arm, Patient Position: Sitting, Cuff Size: Large)   Pulse 92   Temp 97.9 F (36.6 C) (Oral)   Resp 17   Ht 5\' 7"  (1.702 m)   Wt 171 lb (77.6 kg)   SpO2 97%   BMI 26.78 kg/m    Subjective:    Patient ID: Ellen Jackson, female    DOB: 1991-05-10, 26 y.o.   MRN: [id number]  HPI: Ellen Jackson is a 26 y.o. female  Chief Complaint  Patient presents with  . Cough    onset 3 weeks/ Left rib pain from coughing  . Sinusitis    onset 3 days   UPPER RESPIRATORY TRACT INFECTION Duration: 2.5-3 weeks Worst symptom: cough, congestion, rib pain Fever: no- chills and sweats Cough: yes Shortness of breath: yes Wheezing: yes Chest pain: yes, with cough Chest tightness: yes Chest congestion: yes Nasal congestion: yes Runny nose: no Post nasal drip: yes Sneezing: no Sore throat: no Swollen glands: no Sinus pressure: yes Headache: yes Face pain: yes Toothache: yes Ear pain: yes left Ear pressure: yes left Eyes red/itching:no Eye drainage/crusting: no  Vomiting: no Rash: no Fatigue: yes Sick contacts: no Strep contacts: no  Context: worse Recurrent sinusitis: no Relief with OTC cold/cough medications: no  Treatments attempted: cold/sinus, mucinex, anti-histamine and pseudoephedrine  Relevant past medical, surgical, family and social history reviewed and updated as indicated. Interim medical history since our last visit reviewed. Allergies and medications reviewed and updated.  Review of Systems  Constitutional: Negative.   HENT: Positive for congestion, ear pain, postnasal drip, rhinorrhea, sinus pain and sinus pressure. Negative for dental problem, drooling, ear discharge, facial swelling, hearing loss, mouth sores, nosebleeds, sneezing, sore throat, tinnitus, trouble swallowing and voice change.   Respiratory: Positive for cough, chest tightness, shortness of breath and wheezing. Negative for apnea, choking and stridor.   Cardiovascular:  Positive for chest pain. Negative for palpitations and leg swelling.  Psychiatric/Behavioral: Negative.     Per HPI unless specifically indicated above     Objective:    BP 104/68 (BP Location: Left Arm, Patient Position: Sitting, Cuff Size: Large)   Pulse 92   Temp 97.9 F (36.6 C) (Oral)   Resp 17   Ht 5\' 7"  (1.702 m)   Wt 171 lb (77.6 kg)   SpO2 97%   BMI 26.78 kg/m   Wt Readings from Last 3 Encounters:  11/17/16 171 lb (77.6 kg)  11/10/16 168 lb 6.4 oz (76.4 kg)  10/19/16 160 lb 12.8 oz (72.9 kg)    Physical Exam  Constitutional: She is oriented to person, place, and time. She appears well-developed and well-nourished. No distress.  HENT:  Head: Normocephalic and atraumatic.  Right Ear: Hearing, tympanic membrane, external ear and ear canal normal.  Left Ear: Hearing, tympanic membrane, external ear and ear canal normal.  Nose: Mucosal edema and rhinorrhea present. Right sinus exhibits maxillary sinus tenderness. Right sinus exhibits no frontal sinus tenderness. Left sinus exhibits maxillary sinus tenderness. Left sinus exhibits no frontal sinus tenderness.  Mouth/Throat: Uvula is midline, oropharynx is clear and moist and mucous membranes are normal.  Eyes: Conjunctivae, EOM and lids are normal. Pupils are equal, round, and reactive to light. Right eye exhibits no discharge. Left eye exhibits no discharge. No scleral icterus.  Neck: Normal range of motion. Neck supple. No JVD present. No tracheal deviation present. No thyromegaly present.  Cardiovascular: Normal rate, regular rhythm, normal heart sounds and intact distal pulses.  Exam reveals no gallop and no friction rub.   No murmur heard. Pulmonary/Chest: Effort normal and breath sounds normal. No stridor. No respiratory distress. She has no wheezes. She has no rales. She exhibits no tenderness.    Musculoskeletal: Normal range of motion.  Lymphadenopathy:    She has cervical adenopathy.  Neurological: She is alert  and oriented to person, place, and time.  Skin: Skin is warm, dry and intact. No rash noted. She is not diaphoretic. No erythema. No pallor.  Psychiatric: She has a normal mood and affect. Her speech is normal and behavior is normal. Judgment and thought content normal. Cognition and memory are normal.  Nursing note and vitals reviewed.   Results for orders placed or performed during the hospital encounter of 10/09/16  Pregnancy, urine POC  Result Value Ref Range   Preg Test, Ur NEGATIVE NEGATIVE  Surgical pathology  Result Value Ref Range   SURGICAL PATHOLOGY      Surgical Pathology CASE: [id number] PATIENT: [name] Surgical Pathology Report     SPECIMEN SUBMITTED: A. Prior mesh repair  CLINICAL HISTORY: None provided  PRE-OPERATIVE DIAGNOSIS: Recurrent umbilical hernia  POST-OPERATIVE DIAGNOSIS: Same as pre-op     DIAGNOSIS: A. PRIOR MESH REPAIR: - FIBROADIPOSE TISSUE WITH FOREIGN BODY AND GIANT CELL REACTION, CONSISTENT WITH PROVIDED HISTORY OF PRIOR MESH REPAIR. - NEGATIVE FOR MALIGNANCY.   GROSS DESCRIPTION:  A. Labeled: prior mesh repair  Tissue fragment(s): 1  Size: 4.2 x 2.5 x 0.4 cm  Description: pink fibrous fragment of tissue with focal ecchymosis and embedded clear mesh material with possible blue suture  Representative submitted in one cassette(s).    Final Diagnosis performed by Quay Burow, MD.  Electronically signed 10/12/2016 12:47:44PM    The electronic signature indicates that the named Attending Pathologist has evaluated the specimen  Technical component performed at  [facility], [address], [place], [place] [address] Lab: [phone] Dir: Darrick Penna. Evette Doffing, MD  Professional component performed at [facility], [facility], [place], [place], [place] [address] Lab: [phone] Dir: Dellia Nims. Reuel Derby, MD        Assessment & Plan:   Problem List Items Addressed This Visit    None    Visit  Diagnoses    Left maxillary sinusitis    -  Primary   Will treat with doxycycline. Tessalon perles for comfort. Use inhaler as needed. Call with any concerns or if not getting better.    Relevant Medications   doxycycline (VIBRA-TABS) 100 MG tablet   benzonatate (TESSALON) 200 MG capsule   Rib pain on left side       Will obtain x-ray. Await results. Call with any concerns.    Relevant Orders   DG Ribs Unilateral Left       Follow up plan: Return if symptoms worsen or fail to improve.

## 2016-11-17 NOTE — Telephone Encounter (Signed)
Year supply was sent there at the beginning of the month.

## 2016-11-17 NOTE — Telephone Encounter (Signed)
Pt would like to have lyrica sent to [place]

## 2016-11-17 NOTE — Telephone Encounter (Signed)
Refill

## 2016-11-18 ENCOUNTER — Ambulatory Visit
Admission: RE | Admit: 2016-11-18 | Discharge: 2016-11-18 | Disposition: A | Discharge status: Home / Self Care | Payer: Medicaid Other | Source: Ambulatory Visit | Attending: Family Medicine | Admitting: Family Medicine

## 2016-11-18 DIAGNOSIS — R0781 Pleurodynia: Secondary | ICD-10-CM

## 2016-11-23 ENCOUNTER — Telehealth: Payer: Self-pay | Admitting: Family Medicine

## 2016-11-23 NOTE — Telephone Encounter (Signed)
Routing to provider  

## 2016-11-23 NOTE — Telephone Encounter (Signed)
Patient sinus infection is no better and she is wondering if possible Dr Wynetta Emery could call her in something different for her sinus infection.  [facility]

## 2016-11-23 NOTE — Telephone Encounter (Signed)
She should still have 3-4 days left of antibiotics. If she is not feeling any better, she should be seen.

## 2016-11-24 ENCOUNTER — Encounter: Payer: Self-pay | Admitting: Family Medicine

## 2016-11-24 ENCOUNTER — Ambulatory Visit (INDEPENDENT_AMBULATORY_CARE_PROVIDER_SITE_OTHER): Payer: Medicaid Other | Admitting: Family Medicine

## 2016-11-24 VITALS — BP 111/80 | HR 97 | Temp 98.3°F | Resp 17 | Ht 67.0 in | Wt 173.0 lb

## 2016-11-24 DIAGNOSIS — J320 Chronic maxillary sinusitis: Secondary | ICD-10-CM

## 2016-11-24 DIAGNOSIS — M797 Fibromyalgia: Secondary | ICD-10-CM

## 2016-11-24 MED ORDER — PREDNISONE 50 MG PO TABS: 50.0000 mg | ORAL_TABLET | Freq: Every day | ORAL | 0 refills | Status: DC

## 2016-11-24 MED ORDER — PREGABALIN 75 MG PO CAPS: 75.0000 mg | ORAL_CAPSULE | Freq: Two times a day (BID) | ORAL | 6 refills | Status: DC

## 2016-11-24 NOTE — Telephone Encounter (Signed)
Left message on patients VM to call back to schedule an appointment if she feels she is no better.

## 2016-11-24 NOTE — Telephone Encounter (Signed)
Patient called back to schedule an appt for today with Dr Wynetta Emery.

## 2016-11-24 NOTE — Assessment & Plan Note (Signed)
Will go back down to 75mg  on her lyrica and see if it decreases weight gain.

## 2016-11-24 NOTE — Progress Notes (Signed)
BP 111/80 (BP Location: Left Arm, Patient Position: Sitting, Cuff Size: Normal)   Pulse 97   Temp 98.3 F (36.8 C) (Oral)   Resp 17   Ht 5\' 7"  (1.702 m)   Wt 173 lb (78.5 kg)   SpO2 99%   BMI 27.10 kg/m    Subjective:    Patient ID: Ellen Jackson, female    DOB: [birth year], 28 y.o.   MRN: [id number]  HPI: Ellen Jackson is a 26 y.o. female  Chief Complaint  Patient presents with  . Cough  . Sinusitis   UPPER RESPIRATORY TRACT INFECTION- seen 1 week ago for sinusitis, started on doxycycline. Has not completed course of medication. Duration: 10 days Worst symptom: cough Fever: no, chills and sweats Cough: yes Shortness of breath: yes Wheezing: yes Chest pain: yes, with cough Chest tightness: yes Chest congestion: yes Nasal congestion: yes Runny nose: yes Post nasal drip: yes Sneezing: yes Sore throat: no Swollen glands: no Sinus pressure: yes Headache: yes Face pain: yes Toothache: yes Ear pain: yes bilateral Ear pressure: yes bilateral Eyes red/itching:no Eye drainage/crusting: no  Vomiting: no Rash: no Fatigue: yes Sick contacts: yes Strep contacts: no  Context: worse Recurrent sinusitis: no Relief with OTC cold/cough medications: no  Treatments attempted: NSAIDs, cold and sinus and antibiotics   Concerned because lyrica is making her gain weight. Would like to go back on the 75mg . Pain doing really well. No other concerns.    Relevant past medical, surgical, family and social history reviewed and updated as indicated. Interim medical history since our last visit reviewed. Allergies and medications reviewed and updated.  Review of Systems  Constitutional: Positive for chills, diaphoresis and fatigue. Negative for activity change, appetite change, fever and unexpected weight change.  HENT: Positive for congestion, ear pain, postnasal drip, rhinorrhea, sinus pain and sinus pressure. Negative for dental problem, drooling, ear discharge, facial swelling,  hearing loss, mouth sores, nosebleeds, sneezing, sore throat, tinnitus, trouble swallowing and voice change.   Respiratory: Positive for cough, chest tightness, shortness of breath and wheezing. Negative for apnea, choking and stridor.   Cardiovascular: Negative.   Psychiatric/Behavioral: Negative.     Per HPI unless specifically indicated above     Objective:    BP 111/80 (BP Location: Left Arm, Patient Position: Sitting, Cuff Size: Normal)   Pulse 97   Temp 98.3 F (36.8 C) (Oral)   Resp 17   Ht 5\' 7"  (1.702 m)   Wt 173 lb (78.5 kg)   SpO2 99%   BMI 27.10 kg/m   Wt Readings from Last 3 Encounters:  11/24/16 173 lb (78.5 kg)  11/17/16 171 lb (77.6 kg)  11/10/16 168 lb 6.4 oz (76.4 kg)    Physical Exam  Constitutional: She is oriented to person, place, and time. She appears well-developed and well-nourished. No distress.  HENT:  Head: Normocephalic and atraumatic.  Right Ear: Hearing and external ear normal. A middle ear effusion is present.  Left Ear: Hearing, tympanic membrane, external ear and ear canal normal.  Nose: Mucosal edema and rhinorrhea present. Right sinus exhibits no maxillary sinus tenderness and no frontal sinus tenderness. Left sinus exhibits no maxillary sinus tenderness and no frontal sinus tenderness.  Mouth/Throat: Uvula is midline, oropharynx is clear and moist and mucous membranes are normal. No oropharyngeal exudate.  Eyes: Conjunctivae, EOM and lids are normal. Pupils are equal, round, and reactive to light. Right eye exhibits no discharge. Left eye exhibits no discharge. No scleral icterus.  Neck: Normal range of motion. Neck supple. No JVD present. No tracheal deviation present. No thyromegaly present.  Cardiovascular: Normal rate, regular rhythm, normal heart sounds and intact distal pulses.  Exam reveals no gallop and no friction rub.   No murmur heard. Pulmonary/Chest: Effort normal and breath sounds normal. No stridor. No respiratory distress. She  has no wheezes. She has no rales. She exhibits no tenderness.  Musculoskeletal: Normal range of motion.  Lymphadenopathy:    She has cervical adenopathy.  Neurological: She is alert and oriented to person, place, and time.  Skin: Skin is warm, dry and intact. No rash noted. She is not diaphoretic. No erythema. No pallor.  Psychiatric: She has a normal mood and affect. Her speech is normal and behavior is normal. Judgment and thought content normal. Cognition and memory are normal.  Nursing note and vitals reviewed.   Results for orders placed or performed during the hospital encounter of 10/09/16  Pregnancy, urine POC  Result Value Ref Range   Preg Test, Ur NEGATIVE NEGATIVE  Surgical pathology  Result Value Ref Range   SURGICAL PATHOLOGY      Surgical Pathology CASE: [id number] PATIENT: Ellen Jackson Surgical Pathology Report     SPECIMEN SUBMITTED: A. Prior mesh repair  CLINICAL HISTORY: None provided  PRE-OPERATIVE DIAGNOSIS: Recurrent umbilical hernia  POST-OPERATIVE DIAGNOSIS: Same as pre-op     DIAGNOSIS: A. PRIOR MESH REPAIR: - FIBROADIPOSE TISSUE WITH FOREIGN BODY AND GIANT CELL REACTION, CONSISTENT WITH PROVIDED HISTORY OF PRIOR MESH REPAIR. - NEGATIVE FOR MALIGNANCY.   GROSS DESCRIPTION:  A. Labeled: prior mesh repair  Tissue fragment(s): 1  Size: 4.2 x 2.5 x 0.4 cm  Description: pink fibrous fragment of tissue with focal ecchymosis and embedded clear mesh material with possible blue suture  Representative submitted in one cassette(s).    Final Diagnosis performed by Quay Burow, MD.  Electronically signed 10/12/2016 12:47:44PM    The electronic signature indicates that the named Attending Pathologist has evaluated the specimen  Technical component performed at  [facility], [address], [place], [place] [address] Lab: [phone] Dir: Darrick Penna. Evette Doffing, MD  Professional component performed at [facility], [facility], [place], [place], [place] [address] Lab: [phone] Dir: Dellia Nims. Rubinas, MD        Assessment & Plan:   Problem List Items Addressed This Visit      Other   Fibromyalgia    Will go back down to 75mg  on her lyrica and see if it decreases weight gain.        Other Visit Diagnoses    Left maxillary sinusitis    -  Primary   Improving. Bothered by the congestion. Rx for prednisone sent to her pharmacy.   Relevant Medications   predniSONE (DELTASONE) 50 MG tablet       Follow up plan: Return 2 weeks, for Follow up sinusitis.

## 2016-11-26 ENCOUNTER — Encounter: Payer: Self-pay | Admitting: Surgery

## 2016-12-01 ENCOUNTER — Ambulatory Visit (INDEPENDENT_AMBULATORY_CARE_PROVIDER_SITE_OTHER): Payer: Medicaid Other | Admitting: Neurology

## 2016-12-01 ENCOUNTER — Encounter: Payer: Self-pay | Admitting: Neurology

## 2016-12-01 VITALS — BP 104/68 | HR 78 | Ht 67.0 in | Wt 175.2 lb

## 2016-12-01 DIAGNOSIS — F419 Anxiety disorder, unspecified: Secondary | ICD-10-CM

## 2016-12-01 DIAGNOSIS — G43109 Migraine with aura, not intractable, without status migrainosus: Secondary | ICD-10-CM

## 2016-12-01 DIAGNOSIS — F17210 Nicotine dependence, cigarettes, uncomplicated: Secondary | ICD-10-CM

## 2016-12-01 MED ORDER — NORTRIPTYLINE HCL 10 MG PO CAPS: 10.0000 mg | ORAL_CAPSULE | Freq: Every day | ORAL | 3 refills | Status: DC

## 2016-12-01 MED ORDER — SUMATRIPTAN SUCCINATE 100 MG PO TABS: ORAL_TABLET | ORAL | 2 refills | Status: DC

## 2016-12-01 MED ORDER — VENLAFAXINE HCL ER 37.5 MG PO CP24: ORAL_CAPSULE | ORAL | 0 refills | Status: DC

## 2016-12-01 NOTE — Progress Notes (Signed)
NEUROLOGY CONSULTATION NOTE  [name] MRN: [id number] DOB: [birth year]  Referring provider: Dr. Wynetta Emery Primary care provider: Dr. Wynetta Emery  Reason for consult:  Migraines  HISTORY OF PRESENT ILLNESS: Ellen Jackson is a 26 year old right-handed female with fibromyalgia, SVT and anxiety who presents for headache.  She is accompanied by her mother who supplements history.  Onset:  26 years old Location:  Usually unilateral either side (temple, eye, ear, jaw) Quality:  Sharp/throbbing Intensity:  9/10 Aura:  Sometimes preceded by zigzag lines in one eye about 1 to 2 days prior to onset of headache Prodrome:  fatigued Associated symptoms:  Nausea, photophobia, phonophobia, osmophobia, blurred vision, sometimes vomiting.  She has not had any new worse headache of her life, waking up from sleep Duration:  1 to 5 days Frequency:  2 to 3 times a week (about 8 to 10 headache days per month) Frequency of abortive medication: daily Triggers/exacerbating factors:  Strong scents (perfume), high-pitched noise Relieving factors:  no Activity:  Cannot function 2 days a week  Past NSAIDS:  naproxen, tramadol Past analgesics:  Excedrin, Tylenol Past abortive triptans:  Sumatriptan tablet (tried once and went to sleep.  She woke up confused); Relpax (may have been helpful) Past muscle relaxants:  no Past anti-emetic:  Zofran (allergic), Phenergan (side effects) Past antihypertensive medications:  Propranolol (years ago switched to metoprolol for SVT) Past antidepressant medications:  Amitriptyline (side effects), Zoloft (many years ago but not for migraine, ineffective for migraines) Past anticonvulsant medications:  topiramate ER (paresthesia of hands) Past vitamins/Herbal/Supplements:  no Other past therapies:  Diet (fish, omega 3, flaxseed), chiropractic medicine  Current NSAIDS:  ibuprofen 800mg  Current analgesics:  BC (ASA-salicylamide-caffeine) Current triptans:  no Current  anti-emetic:  Emetrol Current muscle relaxants:  cyclobenzaprine Current anti-anxiolytic:  no Current sleep aide:  no Current Antihypertensive medications:  Toprol XL 25mg  Current Antidepressant medications:  citalopram 60mg  Current Anticonvulsant medications:  pregablin 150mg  twice daily (for fibromyalgia) Current Vitamins/Herbal/Supplements:  no Current Antihistamines/Decongestants:  no Other therapy:  no Birth Control:  Depo-Provera  Caffeine:  Tea, Mt. Dew Alcohol:  occasionally Smoker:  yes Diet:  Needs to increase water intake; skips meals; drinks Mt Dew Exercise:  walks Depression/anxiety:  anxiety Sleep hygiene:  poor Family history of headache:  Mother used to have ocular migraines  CT of head from 03/24/16 to evaluate headache was personally reviewed and was normal. Labs 07/02/16:  CBC with WBC 5.7, HGB 13.1, HCT 39.6, PLT 147; CMP with Na 140, K 4.4, glucose 75, BUN 15, Cr 0.76, total bili 0.5, ALP 74, AST 17, ALT 16; Sed Rate 2; ANA negative; Lyme negative  PAST MEDICAL HISTORY: Past Medical History:  Diagnosis Date  . Allergy   . Anxiety   . Dysrhythmia   . Fibromyalgia    everywhere  . GERD (gastroesophageal reflux disease)   . Hernia, umbilical   . 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 IBS (irritable bowel syndrome)   . Migraine with aura    bc powders is only thing that works.  . Ovarian cyst   . Spina bifida occulta   . SVT (supraventricular tachycardia) ([place])     PAST SURGICAL HISTORY: Past Surgical History:  Procedure Laterality Date  . APPENDECTOMY  2015  . CESAREAN SECTION  2011  . CHOLECYSTECTOMY  2005  . HERNIA REPAIR  6160   Umbilical Hernia Repair with mesh  . KNEE ARTHROSCOPY Left 2015   X 3. plate and pin in left  shin placed after several surgeries. last surgery for this was 20154  . OVARIAN CYST SURGERY    . UMBILICAL HERNIA REPAIR N/A 10/09/2016   Procedure: HERNIA REPAIR UMBILICAL ADULT;  Surgeon: Olean Ree, MD;  Location: [facility]  ORS;  Service: General;  Laterality: N/A;    MEDICATIONS: Current Outpatient Prescriptions on File Prior to Visit  Medication Sig Dispense Refill  . albuterol (PROVENTIL HFA;VENTOLIN HFA) 108 (90 Base) MCG/ACT inhaler Inhale 2 puffs into the lungs every 6 (six) hours as needed for wheezing or shortness of breath. 1 Inhaler 0  . Aspirin-Salicylamide-Caffeine (BC FAST PAIN RELIEF) 650-195-33.3 MG PACK Take 1 packet by mouth every 8 (eight) hours as needed (pain).     . benzonatate (TESSALON) 200 MG capsule Take 1 capsule (200 mg total) by mouth 2 (two) times daily as needed for cough. 20 capsule 0  . cyclobenzaprine (FLEXERIL) 10 MG tablet Take 1 tablet (10 mg total) by mouth 3 (three) times daily as needed for muscle spasms. 30 tablet 0  . doxycycline (VIBRA-TABS) 100 MG tablet Take 1 tablet (100 mg total) by mouth 2 (two) times daily. 20 tablet 0  . ibuprofen (ADVIL,MOTRIN) 600 MG tablet Take 1 tablet (600 mg total) by mouth every 8 (eight) hours as needed for fever or mild pain. 30 tablet 0  . medroxyPROGESTERone (DEPO-PROVERA) 150 MG/ML injection Inject 150 mg into the muscle every 3 (three) months. Next injection due 12-2016    . metoprolol succinate (TOPROL XL) 25 MG 24 hr tablet Take 25 mg by mouth daily.    . polyethylene glycol (MIRALAX / GLYCOLAX) packet Take 17 g by mouth 2 (two) times daily as needed for mild constipation.     . pregabalin (LYRICA) 75 MG capsule Take 1 capsule (75 mg total) by mouth 2 (two) times daily. 60 capsule 6  . ranitidine (ZANTAC) 150 MG tablet Take 1 tablet (150 mg total) by mouth daily as needed for heartburn. 90 tablet 3  . predniSONE (DELTASONE) 50 MG tablet Take 1 tablet (50 mg total) by mouth daily with breakfast. (Patient not taking: Reported on 12/01/2016) 5 tablet 0   No current facility-administered medications on file prior to visit.     ALLERGIES: Allergies  Allergen Reactions  . Codeine     Other reaction(s): Other (See Comments) Passes out    . Latex Itching and Rash    Lips itching  . Morphine Itching and Shortness Of Breath  . Nalbuphine Itching and Shortness Of Breath  . Ondansetron Hcl Hives and Rash  . Other Hives    Allergic to steri strips. tegaderm ok.. Paper tape is ok. Clear plastic tape rips off skin  . Penicillins Rash    Patient reports severe rash and itching. Has patient had a PCN reaction causing immediate rash, facial/tongue/throat swelling, SOB or lightheadedness with hypotension: Yes Has patient had a PCN reaction causing severe rash involving mucus membranes or skin necrosis: No Has patient had a PCN reaction that required hospitalization unknown Has patient had a PCN reaction occurring within the last 10 years:  unknown If all of the above answers are "NO", then may proceed  . Metoclopramide Anxiety    Other reaction(s): Other (See Comments) "jittery" Difficulty breathing  . Prochlorperazine Anxiety and Other (See Comments)    Difficulty breathing  . Promethazine Anxiety and Other (See Comments)    "jittery" Difficulty breathing     . Tape     See above ... Paper tape ok  . Tizanidine  Other (See Comments)    Worsens headache    FAMILY HISTORY: Family History  Problem Relation Age of Onset  . Arthritis Mother   . Hyperlipidemia Mother   . Hypertension Mother   . Migraines Mother   . Arthritis Father   . Autism Son   . Cancer Maternal Grandfather     pancreatic  . Stroke Maternal Grandfather   . Diabetes Paternal Grandmother     SOCIAL HISTORY: Social History   Social History  . Marital status: Single    Spouse name: N/A  . Number of children: N/A  . Years of education: N/A   Occupational History  . Not on file.   Social History Main Topics  . Smoking status: Current Every Day Smoker    Packs/day: 0.50    Years: 3.00    Types: Cigarettes  . Smokeless tobacco: Never Used  . Alcohol use No  . Drug use: No  . Sexual activity: No   Other Topics Concern  . Not on file    Social History Narrative  . No narrative on file    REVIEW OF SYSTEMS: Constitutional: No fevers, chills, or sweats, no generalized fatigue, change in appetite Eyes: No visual changes, double vision, eye pain Ear, nose and throat: No hearing loss, ear pain, nasal congestion, sore throat Cardiovascular: No chest pain, palpitations Respiratory:  No shortness of breath at rest or with exertion, wheezes GastrointestinaI: No nausea, vomiting, diarrhea, abdominal pain, fecal incontinence Genitourinary:  No dysuria, urinary retention or frequency Musculoskeletal:  No neck pain, back pain Integumentary: No rash, pruritus, skin lesions Neurological: as above Psychiatric: No depression, insomnia, anxiety Endocrine: No palpitations, fatigue, diaphoresis, mood swings, change in appetite, change in weight, increased thirst Hematologic/Lymphatic:  No purpura, petechiae. Allergic/Immunologic: no itchy/runny eyes, nasal congestion, recent allergic reactions, rashes  PHYSICAL EXAM: Vitals:   12/01/16 0858  BP: 104/68  Pulse: 78   General: No acute distress.  Patient appears well-groomed.  Head:  Normocephalic/atraumatic Eyes:  fundi examined but not visualized Neck: supple, no paraspinal tenderness, full range of motion Back: No paraspinal tenderness Heart: regular rate and rhythm Lungs: Clear to auscultation bilaterally. Vascular: No carotid bruits. Neurological Exam: Mental status: alert and oriented to person, place, and time, recent and remote memory intact, fund of knowledge intact, attention and concentration intact, speech fluent and not dysarthric, language intact. Cranial nerves: CN I: not tested CN II: pupils equal, round and reactive to light, visual fields intact CN III, IV, VI:  full range of motion, no nystagmus, no ptosis CN V: facial sensation intact CN VII: upper and lower face symmetric CN VIII: hearing intact CN IX, X: gag intact, uvula midline CN XI:  sternocleidomastoid and trapezius muscles intact CN XII: tongue midline Bulk & Tone: normal, no fasciculations. Motor:  5/5 throughout  Sensation: temperature and vibration sensation intact. Deep Tendon Reflexes:  2+ throughout, toes downgoing.  Finger to nose testing:  Without dysmetria.  Heel to shin:  Without dysmetria.  Gait:  Normal station and stride.  Able to turn and tandem walk. Romberg negative.  IMPRESSION: Migraine with aura Anxiety Cigarette smoker  PLAN: 1.  To address both anxiety and migraine, we will taper off of citalopram and then start venlafaxine XR, titrating to dose of 75mg  daily. 2.  I don't think the "confusion" was related to sumatriptan.  She reports sumatriptan injection in the ED was effective.  We will retry sumatriptan 100mg  tablets. 3.  Try supplements:  Magnesium citrate, riboflavin, CoQ10  4.  Diet:  Increase water intake, avoid all soda, do not skip meals, assess for food triggers 5.  Smoking cessation 6.  Increase routine exercise 7.  Sleep hygiene 8.  Follow up in 4 months.  Thank you for allowing me to take part in the care of this patient.  Metta Clines, DO  CC: Valerie Roys, DO

## 2016-12-01 NOTE — Patient Instructions (Addendum)
Migraine Recommendations: 1.  I want to switch you from citalopram to venlafaxine XR (Effexor), because venlafaxine is effective for both anxiety ("nerves") as well as migraines.  First, we will taper off of citalopram:   Take 1 tablet daily for 2 weeks   Then 1/2 tablet daily for 2 weeks   Then Stop.        Once you are off of citalopram, you will start venlafaxine XR.  Take 37.5mg  daily for 7 days, then increase to 75mg  daily. 2.  Take sumatriptan 100mg  at earliest onset of headache.  May repeat dose once in 2 hours if needed.  Do not exceed two tablets in 24 hours. 3.  Limit use of pain relievers to no more than 2 days out of the week.  These medications include acetaminophen, ibuprofen, triptans and narcotics.  This will help reduce risk of rebound headaches. 4.  Be aware of common food triggers such as processed sweets, processed foods with nitrites (such as deli meat, hot dogs, sausages), foods with MSG, alcohol (such as wine), chocolate, certain cheeses, certain fruits (dried fruits, some citrus fruit), vinegar, diet soda. 4.  Avoid caffeine.  Stop all soda. 5.  Routine exercise 6.  Proper sleep hygiene 7.  Stay adequately hydrated with water 8.  Keep a headache diary. 9.  Maintain proper stress management. 10.  Do not skip meals. 11.  Consider supplements:  Magnesium citrate 400mg  to 600mg  daily, riboflavin 400mg , Coenzyme Q 10 100mg  three times daily 12.  Discuss with your gastroenterologist about switching citalopram to venlafaxine XR (Effexor) 13.  Follow up in 4 months.   Migraine Headache A migraine headache is a very strong throbbing pain on one side or both sides of your head. Migraines can also cause other symptoms. Talk with your doctor about what things may bring on (trigger) your migraine headaches. Follow these instructions at home: Medicines   Take over-the-counter and prescription medicines only as told by your doctor.  Do not drive or use heavy machinery while  taking prescription pain medicine.  To prevent or treat constipation while you are taking prescription pain medicine, your doctor may recommend that you:  Drink enough fluid to keep your pee (urine) clear or pale yellow.  Take over-the-counter or prescription medicines.  Eat foods that are high in fiber. These include fresh fruits and vegetables, whole grains, and beans.  Limit foods that are high in fat and processed sugars. These include fried and sweet foods. Lifestyle   Avoid alcohol.  Do not use any products that contain nicotine or tobacco, such as cigarettes and e-cigarettes. If you need help quitting, ask your doctor.  Get at least 8 hours of sleep every night.  Limit your stress. General instructions    Keep a journal to find out what may bring on your migraines. For example, write down:  What you eat and drink.  How much sleep you get.  Any change in what you eat or drink.  Any change in your medicines.  If you have a migraine:  Avoid things that make your symptoms worse, such as bright lights.  It may help to lie down in a dark, quiet room.  Do not drive or use heavy machinery.  Ask your doctor what activities are safe for you.  Keep all follow-up visits as told by your doctor. This is important. Contact a doctor if:  You get a migraine that is different or worse than your usual migraines. Get help right away if:  Your migraine gets very bad.  You have a fever.  You have a stiff neck.  You have trouble seeing.  Your muscles feel weak or like you cannot control them.  You start to lose your balance a lot.  You start to have trouble walking.  You pass out (faint). This information is not intended to replace advice given to you by your health care provider. Make sure you discuss any questions you have with your health care provider. Document Released: 05/19/2008 Document Revised: 02/28/2016 Document Reviewed: 01/27/2016 Elsevier Interactive  Patient Education  2017 [employer].

## 2016-12-03 ENCOUNTER — Emergency Department
Admission: EM | Admit: 2016-12-03 | Discharge: 2016-12-03 | Disposition: A | Discharge status: Home / Self Care | Payer: Medicaid Other | Attending: Emergency Medicine | Admitting: Emergency Medicine

## 2016-12-03 ENCOUNTER — Emergency Department: Payer: Medicaid Other

## 2016-12-03 ENCOUNTER — Encounter: Payer: Self-pay | Admitting: *Deleted

## 2016-12-03 DIAGNOSIS — J209 Acute bronchitis, unspecified: Secondary | ICD-10-CM | POA: Diagnosis not present

## 2016-12-03 DIAGNOSIS — F17210 Nicotine dependence, cigarettes, uncomplicated: Secondary | ICD-10-CM | POA: Diagnosis not present

## 2016-12-03 DIAGNOSIS — Z79899 Other long term (current) drug therapy: Secondary | ICD-10-CM | POA: Diagnosis not present

## 2016-12-03 DIAGNOSIS — Z91040 Latex allergy status: Secondary | ICD-10-CM | POA: Insufficient documentation

## 2016-12-03 DIAGNOSIS — R05 Cough: Secondary | ICD-10-CM | POA: Diagnosis present

## 2016-12-03 MED ORDER — AZITHROMYCIN 250 MG PO TABS: ORAL_TABLET | ORAL | 0 refills | Status: DC

## 2016-12-03 NOTE — ED Provider Notes (Signed)
[facility] Emergency Department Provider Note  ____________________________________________  Time seen: Approximately 6:45 PM  I have reviewed the triage vital signs and the nursing notes.   HISTORY  Chief Complaint Cough    HPI Ellen Jackson is a 26 y.o. female presenting to the emergency department with a productive cough for yellow and green sputum production for approximately 1 month. Patient states that she has also experienced right sided anterior chest wall pain with referred pain to the right shoulder that started approximately two days ago. Patient has been taking ibuprofen, which relieves her symptoms minimally. Patient denies rhinorrhea or congestion. Patient has experienced intermittent shortness of breath. She denies chest pain, chest tightness, nausea, vomiting and abdominal pain. Patient has had chills. However, she has not evaluated her temperature.    Past Medical History:  Diagnosis Date  . Allergy   . Anxiety   . Dysrhythmia   . Fibromyalgia    everywhere  . GERD (gastroesophageal reflux disease)   . Hernia, umbilical   . Heterozygous MTHFR mutation [id number] ([place])   . IBS (irritable bowel syndrome)   . Migraine with aura    bc powders is only thing that works.  . Ovarian cyst   . Spina bifida occulta   . SVT (supraventricular tachycardia) [facility])     Patient Active Problem List   Diagnosis Date Noted  . S/P umbilical hernia repair, follow-up exam 10/19/2016  . Exertional dyspnea 10/14/2016  . Recurrent umbilical hernia 89/38/1017  . Fibromyalgia 07/30/2016  . Anxiety 02/05/2015  . Headache, migraine 02/05/2015  . Clinical depression 02/05/2015  . 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 Paroxysmal supraventricular tachycardia ([place]) 02/05/2015  . LLQ abdominal pain 02/05/2015  . Anxiety disorder 02/05/2015  . Major depressive disorder, single episode 02/05/2015  .  Gonalgia 10/16/2014  . Pain in joint involving lower leg 10/16/2014    Past Surgical History:  Procedure Laterality Date  . APPENDECTOMY  2015  . CESAREAN SECTION  2011  . CHOLECYSTECTOMY  2005  . HERNIA REPAIR  5102   Umbilical Hernia Repair with mesh  . KNEE ARTHROSCOPY Left 2015   X 3. plate and pin in left shin placed after several surgeries. last surgery for this was 20154  . OVARIAN CYST SURGERY    . UMBILICAL HERNIA REPAIR N/A 10/09/2016   Procedure: HERNIA REPAIR UMBILICAL ADULT;  Surgeon: Olean Ree, MD;  Location: [facility] ORS;  Service: General;  Laterality: N/A;    Prior to Admission medications   Medication Sig Start Date End Date Taking? Authorizing Provider  albuterol (PROVENTIL HFA;VENTOLIN HFA) 108 (90 Base) MCG/ACT inhaler Inhale 2 puffs into the lungs every 6 (six) hours as needed for wheezing or shortness of breath. 11/17/16   Megan P Johnson, DO  Aspirin-Salicylamide-Caffeine (BC FAST PAIN RELIEF) 650-195-33.3 MG PACK Take 1 packet by mouth every 8 (eight) hours as needed (pain).     Historical Provider, MD  azithromycin (ZITHROMAX Z-PAK) 250 MG tablet Take 2 tablets by mouth on the first day. Take 1 tablet by mouth daily for the next 4 days. 12/03/16   Lannie Fields, PA-C  benzonatate (TESSALON) 200 MG capsule Take 1 capsule (200 mg total) by mouth 2 (two) times daily as needed for cough. 11/17/16   Megan P Johnson, DO  cyclobenzaprine (FLEXERIL) 10 MG tablet Take 1 tablet (10 mg total) by mouth 3 (three) times daily as needed for muscle spasms. 10/16/16  Diego Sarita Haver, MD  doxycycline (VIBRA-TABS) 100 MG tablet Take 1 tablet (100 mg total) by mouth 2 (two) times daily. 11/17/16   Megan P Johnson, DO  ibuprofen (ADVIL,MOTRIN) 600 MG tablet Take 1 tablet (600 mg total) by mouth every 8 (eight) hours as needed for fever or mild pain. 10/09/16   Olean Ree, MD  medroxyPROGESTERone (DEPO-PROVERA) 150 MG/ML injection Inject 150 mg into the muscle every 3 (three) months. Next  injection due 12-2016    Historical Provider, MD  metoprolol succinate (TOPROL XL) 25 MG 24 hr tablet Take 25 mg by mouth daily.    Historical Provider, MD  polyethylene glycol (MIRALAX / GLYCOLAX) packet Take 17 g by mouth 2 (two) times daily as needed for mild constipation.     Historical Provider, MD  predniSONE (DELTASONE) 50 MG tablet Take 1 tablet (50 mg total) by mouth daily with breakfast. Patient not taking: Reported on 12/01/2016 11/24/16   Megan P Johnson, DO  pregabalin (LYRICA) 75 MG capsule Take 1 capsule (75 mg total) by mouth 2 (two) times daily. 11/24/16   Megan P Johnson, DO  ranitidine (ZANTAC) 150 MG tablet Take 1 tablet (150 mg total) by mouth daily as needed for heartburn. 07/02/16   Megan P Johnson, DO  SUMAtriptan (IMITREX) 100 MG tablet Take 1 tablet earliest onset of migraine.  May repeat once in 2 hours if headache persists or recurs.  Do not exceed 2 tablets in 24 hours 12/01/16   Pieter Partridge, DO  venlafaxine XR (EFFEXOR XR) 37.5 MG 24 hr capsule Take 1 capsule daily with breakfast for 7 days, then increase to 2 capsules daily with breakfast. 12/01/16   Pieter Partridge, DO    Allergies Codeine; Latex; Morphine; Nalbuphine; Ondansetron hcl; Other; Penicillins; Metoclopramide; Prochlorperazine; Promethazine; Tape; and Tizanidine  Family History  Problem Relation Age of Onset  . Arthritis Mother   . Hyperlipidemia Mother   . Hypertension Mother   . Migraines Mother   . Arthritis Father   . Autism Son   . Cancer Maternal Grandfather     pancreatic  . Stroke Maternal Grandfather   . Diabetes Paternal Grandmother     Social History Social History  Substance Use Topics  . Smoking status: Current Every Day Smoker    Packs/day: 0.50    Years: 3.00    Types: Cigarettes  . Smokeless tobacco: Never Used  . Alcohol use No   Review of Systems  Constitutional: Patient has had chills.  Eyes: No visual changes. No discharge ENT: No upper respiratory  complaints. Cardiovascular: no chest pain. Respiratory: Patient has had productive cough and shortness of breath. Gastrointestinal: No abdominal pain.  No nausea, no vomiting.  No diarrhea.  No constipation. Musculoskeletal: Negative for musculoskeletal pain. Skin: Negative for rash, abrasions, lacerations, ecchymosis. Neurological: Negative for headaches, focal weakness or numbness. ____________________________________________   PHYSICAL EXAM:  VITAL SIGNS: ED Triage Vitals  Enc Vitals Group     BP 12/03/16 1717 123/74     Pulse Rate 12/03/16 1717 94     Resp 12/03/16 1717 18     Temp 12/03/16 1717 97.8 F (36.6 C)     Temp Source 12/03/16 1717 Oral     SpO2 12/03/16 1717 100 %     Weight 12/03/16 1717 175 lb (79.4 kg)     Height 12/03/16 1717 5\' 7"  (1.702 m)     Head Circumference --      Peak Flow --  Pain Score 12/03/16 1718 9     Pain Loc --      Pain Edu? --      Excl. in [place]? --     Constitutional: Alert and oriented. Well appearing and in no acute distress. Eyes: Conjunctivae are normal. PERRL. EOMI. Head: Atraumatic. ENT:      Ears: Tympanic membranes are pearly bilaterally.      Nose: No congestion/rhinnorhea.      Mouth/Throat: Mucous membranes are moist.  Neck: FROM.  Hematological/Lymphatic/Immunilogical: No cervical lymphadenopathy. Cardiovascular: Normal rate, regular rhythm. Normal S1 and S2.  Good peripheral circulation. Respiratory: Normal respiratory effort without tachypnea or retractions. Lungs CTAB. Good air entry to the bases with no decreased or absent breath sounds. Gastrointestinal: Bowel sounds 4 quadrants. Soft and nontender to palpation. No guarding or rigidity. No palpable masses. No distention. No CVA tenderness. Musculoskeletal: Full range of motion to all extremities. No gross deformities appreciated. Neurologic:  Normal speech and language. No gross focal neurologic deficits are appreciated.  Skin:  Skin is warm, dry and intact. No  rash noted. Psychiatric: Mood and affect are normal. Speech and behavior are normal. Patient exhibits appropriate insight and judgement.  ____________________________________________   LABS (all labs ordered are listed, but only abnormal results are displayed)  Labs Reviewed - No data to display ____________________________________________  EKG   ____________________________________________  RADIOLOGY Unk Pinto, personally viewed and evaluated these images (plain radiographs) as part of my medical decision making, as well as reviewing the written report by the radiologist.  Dg Chest 2 View  Result Date: 12/03/2016 CLINICAL DATA:  One month history of cough. EXAM: CHEST  2 VIEW COMPARISON:  11/18/2016 FINDINGS: The cardiac silhouette, mediastinal and hilar contours are within normal limits and stable. The lungs are clear. Prominent infrahilar vessels due to a significant pectus deformity. No pleural effusions. The bony thorax is intact. IMPRESSION: No acute cardiopulmonary findings.  Significant pectus deformity. Electronically Signed   By: Marijo Sanes M.D.   On: 12/03/2016 18:18    ____________________________________________    PROCEDURES  Procedure(s) performed:    Procedures    Medications - No data to display   ____________________________________________   INITIAL IMPRESSION / ASSESSMENT AND PLAN / ED COURSE  Pertinent labs & imaging results that were available during my care of the patient were reviewed by me and considered in my medical decision making (see chart for details).  Review of the [place] CSRS was performed in accordance of the [place] prior to dispensing any controlled drugs.     Assessment and Plan: Acute Bronchitis: Patient presents to the emergency department with productive cough, intermittent shortness of breath and fatigue. DG chest reveals no consolidations or findings consistent with pneumonia. Patient was discharged with  azithromycin. Vital signs and physical exam are reassuring at this time. Patient was advised follow-up with her primary care provider in one week. All patient questions were answered. Strict return precautions were given. Patient voiced understanding regarding these return precautions. ____________________________________________  FINAL CLINICAL IMPRESSION(S) / ED DIAGNOSES  Final diagnoses:  Acute bronchitis, unspecified organism      NEW MEDICATIONS STARTED DURING THIS VISIT:  New Prescriptions   AZITHROMYCIN (ZITHROMAX Z-PAK) 250 MG TABLET    Take 2 tablets by mouth on the first day. Take 1 tablet by mouth daily for the next 4 days.        This chart was dictated using voice recognition software/Dragon. Despite best efforts to proofread, errors can occur which can change  the meaning. Any change was purely unintentional.    Lannie Fields, PA-C 12/03/16 [place], MD 12/03/16 2217

## 2016-12-03 NOTE — ED Provider Notes (Signed)
 -----------------------------------------   6:14 PM on 12/03/2016 -----------------------------------------  EKG interpreted by me Normal sinus rhythm rate of 73, normal axis and intervals. Normal QRS and ST segments. Isolated T-wave inversions in leads 3 and V3 which are nonspecific.   Carrie Mew, MD 12/03/16 [phone]

## 2016-12-03 NOTE — ED Triage Notes (Signed)
Pt right rib pain with a cough for 1 month.  cig smoker.  States pain is worse today.  No fever.  Pt alert

## 2016-12-03 NOTE — ED Notes (Signed)
See triage note  Developed a cough about 1 month ago. Cough is non prod  Afebrile on arrival

## 2017-01-06 ENCOUNTER — Encounter: Payer: Self-pay | Admitting: Family Medicine

## 2017-01-06 ENCOUNTER — Ambulatory Visit
Admission: RE | Admit: 2017-01-06 | Discharge: 2017-01-06 | Disposition: A | Discharge status: Home / Self Care | Payer: Medicaid Other | Source: Ambulatory Visit | Attending: Family Medicine | Admitting: Family Medicine

## 2017-01-06 ENCOUNTER — Ambulatory Visit (INDEPENDENT_AMBULATORY_CARE_PROVIDER_SITE_OTHER): Payer: Medicaid Other | Admitting: Family Medicine

## 2017-01-06 VITALS — BP 114/81 | HR 86 | Temp 98.4°F | Ht 67.0 in | Wt 179.0 lb

## 2017-01-06 DIAGNOSIS — J069 Acute upper respiratory infection, unspecified: Secondary | ICD-10-CM

## 2017-01-06 DIAGNOSIS — Z3042 Encounter for surveillance of injectable contraceptive: Secondary | ICD-10-CM | POA: Diagnosis not present

## 2017-01-06 DIAGNOSIS — M25552 Pain in left hip: Secondary | ICD-10-CM | POA: Diagnosis present

## 2017-01-06 LAB — PREGNANCY, URINE: Preg Test, Ur: NEGATIVE

## 2017-01-06 MED ORDER — PREDNISONE 50 MG PO TABS: 50.0000 mg | ORAL_TABLET | Freq: Every day | ORAL | 0 refills | Status: DC

## 2017-01-06 MED ORDER — DOXYCYCLINE HYCLATE 100 MG PO TABS: 100.0000 mg | ORAL_TABLET | Freq: Two times a day (BID) | ORAL | 0 refills | Status: DC

## 2017-01-06 MED ORDER — MEDROXYPROGESTERONE ACETATE 150 MG/ML IM SUSP
150.0000 mg | Freq: Once | INTRAMUSCULAR | Status: AC
  Administered 2017-01-06: 150 mg via INTRAMUSCULAR

## 2017-01-06 NOTE — Progress Notes (Signed)
BP 114/81   Pulse 86   Temp 98.4 F (36.9 C) (Oral)   Ht 5\' 7"  (1.702 m)   Wt 179 lb (81.2 kg)   SpO2 98%   BMI 28.04 kg/m    Subjective:    Patient ID: Ellen Jackson, female    DOB: 01/21/1991, 26 y.o.   MRN: [id number]  HPI: Ellen Jackson is a 26 y.o. female  Chief Complaint  Patient presents with  . Hip Pain    Left. x 1 month. Pain when walking.    HIP PAIN Duration: About a month- used to get hip joint injections, hasn't had one in about 8 years Involved hip: left  Mechanism of injury: unknown Location: lateral Onset: sudden  Severity: 8/10  Quality: sharp, dull and aching Frequency: constant Radiation: yes Aggravating factors: weight bearing, walking, running and stairs   Alleviating factors: laying flat in the bed   Status: worse Treatments attempted: rest, ice, heat and ibuprofen   Relief with NSAIDs?: mild Weakness with weight bearing: yes Weakness with walking: yes Paresthesias / decreased sensation: no Swelling: no Redness:no Fevers: no   UPPER RESPIRATORY TRACT INFECTION Duration: 2 weeks Worst symptom: chest congestion Fever: no Cough: yes Shortness of breath: yes Wheezing: yes Chest pain: yes, with cough Chest tightness: yes Chest congestion: yes Nasal congestion: yes Runny nose: yes Post nasal drip: yes Sneezing: yes Sore throat: no Swollen glands: yes Sinus pressure: yes Headache: yes Face pain: yes Toothache: yes Ear pain: yes bilateral Ear pressure: yes bilateral Eyes red/itching:no Eye drainage/crusting: no  Vomiting: no Rash: no Fatigue: yes Sick contacts: yes Strep contacts: no  Context: worse Recurrent sinusitis: no Relief with OTC cold/cough medications: no  Treatments attempted: cold/sinus and anti-histamine   Overdue for her depo- would like to get it today  Relevant past medical, surgical, family and social history reviewed and updated as indicated. Interim medical history since our last visit  reviewed. Allergies and medications reviewed and updated.  Review of Systems  Constitutional: Negative.   HENT: Positive for congestion, ear pain, postnasal drip, rhinorrhea, sinus pain, sinus pressure, sneezing and sore throat. Negative for dental problem, drooling, ear discharge, facial swelling, hearing loss, mouth sores, nosebleeds, tinnitus, trouble swallowing and voice change.   Respiratory: Positive for cough, chest tightness, shortness of breath and wheezing. Negative for apnea, choking and stridor.   Cardiovascular: Negative.   Musculoskeletal: Positive for arthralgias, gait problem and myalgias. Negative for back pain, joint swelling, neck pain and neck stiffness.  Neurological: Negative for dizziness, tremors, seizures, syncope, facial asymmetry, speech difficulty, weakness, light-headedness, numbness and headaches.  Psychiatric/Behavioral: Negative.     Per HPI unless specifically indicated above     Objective:    BP 114/81   Pulse 86   Temp 98.4 F (36.9 C) (Oral)   Ht 5\' 7"  (1.702 m)   Wt 179 lb (81.2 kg)   SpO2 98%   BMI 28.04 kg/m   Wt Readings from Last 3 Encounters:  01/06/17 179 lb (81.2 kg)  12/03/16 175 lb (79.4 kg)  12/01/16 175 lb 3.2 oz (79.5 kg)    Physical Exam  Constitutional: She is oriented to person, place, and time. She appears well-developed and well-nourished. No distress.  HENT:  Head: Normocephalic and atraumatic.  Right Ear: Hearing, tympanic membrane, external ear and ear canal normal.  Left Ear: Hearing, tympanic membrane, external ear and ear canal normal.  Nose: Mucosal edema and rhinorrhea present.  Mouth/Throat: Uvula is midline, oropharynx is  clear and moist and mucous membranes are normal. No oropharyngeal exudate.  Eyes: Conjunctivae, EOM and lids are normal. Pupils are equal, round, and reactive to light. Right eye exhibits no discharge. Left eye exhibits no discharge. No scleral icterus.  Neck: Normal range of motion. Neck  supple. No JVD present. No tracheal deviation present. No thyromegaly present.  Cardiovascular: Normal rate, regular rhythm, normal heart sounds and intact distal pulses.  Exam reveals no gallop and no friction rub.   No murmur heard. Pulmonary/Chest: Effort normal and breath sounds normal. No stridor. No respiratory distress. She has no wheezes. She has no rales. She exhibits no tenderness.  Musculoskeletal: Normal range of motion. She exhibits tenderness. She exhibits no edema or deformity.  Antalgic gait. Tenderness to palpation of L hip, negative FABREs  Lymphadenopathy:    She has cervical adenopathy.  Neurological: She is alert and oriented to person, place, and time.  Skin: Skin is warm, dry and intact. No rash noted. She is not diaphoretic. No erythema. No pallor.  Psychiatric: She has a normal mood and affect. Her speech is normal and behavior is normal. Judgment and thought content normal. Cognition and memory are normal.  Nursing note and vitals reviewed.   Results for orders placed or performed in visit on 01/06/17  Pregnancy, urine  Result Value Ref Range   Preg Test, Ur Negative Negative      Assessment & Plan:   Problem List Items Addressed This Visit    None    Visit Diagnoses    Left hip pain    -  Primary   Has had injections in the past. Will obtain x-ray. Would like to go back to her orthopedist. Referral generated today.   Relevant Orders   DG HIP UNILAT WITH PELVIS 2-3 VIEWS LEFT (Completed)   Ambulatory referral to Orthopedic Surgery   Encounter for surveillance of injectable contraceptive       Pregnancy negative. Depo given today.   Relevant Medications   medroxyPROGESTERone (DEPO-PROVERA) injection 150 mg (Completed)   Other Relevant Orders   Pregnancy, urine (Completed)   Viral upper respiratory tract infection       Will treat with prednisone burst. If not better following that printed Rx for abx given. Call with any concerns or if not getting better.         Follow up plan: Return if symptoms worsen or fail to improve.

## 2017-01-30 ENCOUNTER — Other Ambulatory Visit: Payer: Self-pay | Admitting: Neurology

## 2017-02-01 ENCOUNTER — Other Ambulatory Visit: Payer: Self-pay | Admitting: *Deleted

## 2017-02-01 MED ORDER — VENLAFAXINE HCL ER 37.5 MG PO CP24: ORAL_CAPSULE | ORAL | 5 refills | Status: DC

## 2017-02-28 ENCOUNTER — Emergency Department
Admission: EM | Admit: 2017-02-28 | Discharge: 2017-03-01 | Disposition: A | Discharge status: Home / Self Care | Payer: Medicaid Other | Attending: Emergency Medicine | Admitting: Emergency Medicine

## 2017-02-28 ENCOUNTER — Emergency Department: Payer: Medicaid Other

## 2017-02-28 DIAGNOSIS — Z91040 Latex allergy status: Secondary | ICD-10-CM | POA: Diagnosis not present

## 2017-02-28 DIAGNOSIS — W269XXA Contact with unspecified sharp object(s), initial encounter: Secondary | ICD-10-CM | POA: Insufficient documentation

## 2017-02-28 DIAGNOSIS — F17210 Nicotine dependence, cigarettes, uncomplicated: Secondary | ICD-10-CM | POA: Insufficient documentation

## 2017-02-28 DIAGNOSIS — Y999 Unspecified external cause status: Secondary | ICD-10-CM | POA: Diagnosis not present

## 2017-02-28 DIAGNOSIS — Z79899 Other long term (current) drug therapy: Secondary | ICD-10-CM | POA: Diagnosis not present

## 2017-02-28 DIAGNOSIS — Y9301 Activity, walking, marching and hiking: Secondary | ICD-10-CM | POA: Insufficient documentation

## 2017-02-28 DIAGNOSIS — Z793 Long term (current) use of hormonal contraceptives: Secondary | ICD-10-CM | POA: Insufficient documentation

## 2017-02-28 DIAGNOSIS — Y92007 Garden or yard of unspecified non-institutional (private) residence as the place of occurrence of the external cause: Secondary | ICD-10-CM | POA: Insufficient documentation

## 2017-02-28 DIAGNOSIS — S91111A Laceration without foreign body of right great toe without damage to nail, initial encounter: Secondary | ICD-10-CM | POA: Diagnosis not present

## 2017-02-28 DIAGNOSIS — S99921A Unspecified injury of right foot, initial encounter: Secondary | ICD-10-CM | POA: Diagnosis present

## 2017-02-28 MED ORDER — LIDOCAINE HCL (PF) 1 % IJ SOLN
5.0000 mL | Freq: Once | INTRAMUSCULAR | Status: AC
  Administered 2017-02-28: 5 mL via INTRADERMAL
  Filled 2017-02-28: qty 5

## 2017-02-28 MED ORDER — BACITRACIN ZINC 500 UNIT/GM EX OINT
1.0000 "application " | TOPICAL_OINTMENT | Freq: Two times a day (BID) | CUTANEOUS | Status: DC
  Administered 2017-02-28: 1 via TOPICAL
  Filled 2017-02-28: qty 0.9

## 2017-02-28 NOTE — ED Triage Notes (Addendum)
Pt presents to ED via POV with c/o RIGHT great toe pain d/t a laceration sustained PTA. Pt unsure of object that she got cut on while walking in her yard. Laceration is to the underside of the toe, injury is bandaged, bleeding controlled/stopped at time. RIGHT great toe is WPD, CMS intact, cap refill <3 secs.

## 2017-02-28 NOTE — ED Notes (Signed)
Pt. Verbalizes understanding of d/c instructions and follow-up. VS stable and pain controlled per pt.  Pt. In NAD at time of d/c and denies further concerns regarding this visit. Pt. Stable at the time of departure from the unit, departing unit by the safest and most appropriate manner per that pt condition and limitations. Pt advised to return to the ED at any time for emergent concerns, or for new/worsening symptoms.   

## 2017-02-28 NOTE — Discharge Instructions (Signed)
Do not get the sutured area wet for 24 hours. After 24 hours, shower/bathe as usual and pat the area dry. Change the bandage 2 times per day and apply antibiotic ointment. Leave open to air when at no risk of getting the area dirty, but cover at night before bed. See your primary care provider in 10 days for suture removal or sooner for signs or concern of infection.

## 2017-02-28 NOTE — ED Notes (Signed)
Pt with laceration/puncture wound noted to right base of bottom of great toe. Bleeding is controlled with dressing. Pt states she was walking in grass when she felt something painful strike foot. Pt does not know what caused laceration. Cms intact to tip of right great toe.

## 2017-02-28 NOTE — ED Provider Notes (Signed)
[facility] Emergency Department Provider Note ____________________________________________  Time seen: Approximately 10:35 PM  I have reviewed the triage vital signs and the nursing notes.   HISTORY  Chief Complaint Laceration and Foot Pain    HPI Ellen Jackson is a 26 y.o. female who presents to the emergency department for evaluation and treatment of a laceration to the bottom of her right foot. She is not sure what she stepped on, but she was outside barefoot playing with her children. Tetanus is up-to-date. She has not attempted any alleviating measures prior to arrival.  Past Medical History:  Diagnosis Date  . Allergy   . Anxiety   . Dysrhythmia   . Fibromyalgia    everywhere  . GERD (gastroesophageal reflux disease)   . Hernia, umbilical   . Heterozygous MTHFR mutation C677T ([place])   . IBS (irritable bowel syndrome)   . Migraine with aura    bc powders is only thing that works.  . Ovarian cyst   . Spina bifida occulta   . SVT (supraventricular tachycardia) [facility])     Patient Active Problem List   Diagnosis Date Noted  . S/P umbilical hernia repair, follow-up exam 10/19/2016  . Exertional dyspnea 10/14/2016  . Recurrent umbilical hernia 13/24/4010  . Fibromyalgia 07/30/2016  . Anxiety 02/05/2015  . Headache, migraine 02/05/2015  . Clinical depression 02/05/2015  . 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 Paroxysmal supraventricular tachycardia ([place]) 02/05/2015  . LLQ abdominal pain 02/05/2015  . Anxiety disorder 02/05/2015  . Major depressive disorder, single episode 02/05/2015  . Gonalgia 10/16/2014  . Pain in joint involving lower leg 10/16/2014    Past Surgical History:  Procedure Laterality Date  . APPENDECTOMY  2015  . CESAREAN SECTION  2011  . CHOLECYSTECTOMY  2005  . HERNIA REPAIR  2725   Umbilical Hernia Repair with mesh  . KNEE ARTHROSCOPY Left 2015   X 3.  plate and pin in left shin placed after several surgeries. last surgery for this was 20154  . OVARIAN CYST SURGERY    . UMBILICAL HERNIA REPAIR N/A 10/09/2016   Procedure: HERNIA REPAIR UMBILICAL ADULT;  Surgeon: Olean Ree, MD;  Location: [facility] ORS;  Service: General;  Laterality: N/A;    Prior to Admission medications   Medication Sig Start Date End Date Taking? Authorizing Provider  albuterol (PROVENTIL HFA;VENTOLIN HFA) 108 (90 Base) MCG/ACT inhaler Inhale 2 puffs into the lungs every 6 (six) hours as needed for wheezing or shortness of breath. 11/17/16   Johnson, Megan P, DO  Aspirin-Salicylamide-Caffeine (BC FAST PAIN RELIEF) 650-195-33.3 MG PACK Take 1 packet by mouth every 8 (eight) hours as needed (pain).     [provider]  cyclobenzaprine (FLEXERIL) 10 MG tablet Take 1 tablet (10 mg total) by mouth 3 (three) times daily as needed for muscle spasms. 10/16/16   Pabon, Marjory Lies, MD  doxycycline (VIBRA-TABS) 100 MG tablet Take 1 tablet (100 mg total) by mouth 2 (two) times daily. 01/06/17   Johnson, Megan P, DO  ibuprofen (ADVIL,MOTRIN) 600 MG tablet Take 1 tablet (600 mg total) by mouth every 8 (eight) hours as needed for fever or mild pain. 10/09/16   Olean Ree, MD  medroxyPROGESTERone (DEPO-PROVERA) 150 MG/ML injection Inject 150 mg into the muscle every 3 (three) months. Next injection due 12-2016    [provider]  metoprolol succinate (TOPROL XL) 25 MG 24 hr tablet Take 25 mg by mouth daily.  [provider]  polyethylene glycol (MIRALAX / GLYCOLAX) packet Take 17 g by mouth 2 (two) times daily as needed for mild constipation.     [provider]  predniSONE (DELTASONE) 50 MG tablet Take 1 tablet (50 mg total) by mouth daily with breakfast. 01/06/17   Park Liter P, DO  pregabalin (LYRICA) 75 MG capsule Take 1 capsule (75 mg total) by mouth 2 (two) times daily. 11/24/16   Johnson, Megan P, DO  ranitidine (ZANTAC) 150 MG tablet Take 1 tablet (150  mg total) by mouth daily as needed for heartburn. 07/02/16   Johnson, Megan P, DO  SUMAtriptan (IMITREX) 100 MG tablet Take 1 tablet earliest onset of migraine.  May repeat once in 2 hours if headache persists or recurs.  Do not exceed 2 tablets in 24 hours 12/01/16   Pieter Partridge, DO  venlafaxine XR (EFFEXOR XR) 37.5 MG 24 hr capsule Take 1 capsule daily with breakfast for 7 days, then increase to 2 capsules daily with breakfast. 02/01/17   Pieter Partridge, DO    Allergies Codeine; Latex; Morphine; Nalbuphine; Ondansetron hcl; Other; Penicillins; Metoclopramide; Prochlorperazine; Promethazine; Tape; and Tizanidine  Family History  Problem Relation Age of Onset  . Arthritis Mother   . Hyperlipidemia Mother   . Hypertension Mother   . Migraines Mother   . Arthritis Father   . Autism Son   . Cancer Maternal Grandfather        pancreatic  . Stroke Maternal Grandfather   . Diabetes Paternal Grandmother     Social History Social History  Substance Use Topics  . Smoking status: Current Every Day Smoker    Packs/day: 0.50    Years: 3.00    Types: Cigarettes  . Smokeless tobacco: Never Used  . Alcohol use No    Review of Systems Constitutional: Negative for recent illness. Cardiovascular: Negative for chest pain. Respiratory: Negative for shortness of breath. Musculoskeletal: Positive for right foot pain. Skin: Positive for laceration.  Neurological: Negative for numbness or paresthesias, specifically of the right foot.  ____________________________________________   PHYSICAL EXAM:  VITAL SIGNS: ED Triage Vitals [02/28/17 2007]  Enc Vitals Group     BP 118/77     Pulse Rate 83     Resp 16     Temp 98.2 F (36.8 C)     Temp Source Oral     SpO2      Weight 179 lb (81.2 kg)     Height 5\' 7"  (1.702 m)     Head Circumference      Peak Flow      Pain Score 7     Pain Loc      Pain Edu?      Excl. in [place]?     Constitutional: Alert and oriented. Well appearing and in no  acute distress. Eyes: Conjunctiva are clear without discharge or drainage.  Head: Atraumatic Neck: Active, full range of motion noted. Respiratory: Respirations are even and unlabored. Musculoskeletal: Full, active range of motion throughout, specifically of the right foot and great toe. Neurologic: Sharp and dull sensation is intact. Patient is awake, alert, and oriented 4.  Skin: 1 cm laceration noted in the skin fold of the plantar aspect of the proximal great toe.  Psychiatric: Behavior and affect are appropriate.  ____________________________________________   LABS (all labs ordered are listed, but only abnormal results are displayed)  Labs Reviewed - No data to display ____________________________________________  RADIOLOGY  Right foot negative for retained  foreign body or acute bony abnormality per radiology. ____________________________________________   PROCEDURES  Procedure(s) performed:  LACERATION REPAIR Performed by: Sherrie George  Consent: Verbal consent obtained.  Consent given by: patient  Prepped and Draped in normal sterile fashion  Wound explored: No foreign bodies identified  Laceration Location: The plantar aspect of the right foot at the skin fold of the right great toe and ball of the foot  Laceration Length: 1 cm  Anesthesia: Local   Local anesthetic: lidocaine 1 % without epinephrine  Anesthetic total: 2 ml  Irrigation method: syringe  Amount of cleaning: 500 mL of normal saline   Skin closure: 4-0 nylon   Number of sutures: 3   Technique: Simple interrupted   Patient tolerance: Patient tolerated the procedure well with no immediate complications.     ____________________________________________   INITIAL IMPRESSION / ASSESSMENT AND PLAN / ED COURSE  Ellen Jackson is a 26 y.o. female who presents to the emergency department for evaluation and treatment of the laceration that was sustained in her yard while playing with her  children. 3 sutures were inserted into the plantar aspect of the right foot. She was encouraged to have primary care he moves these in 10-12 days. She was given wound care instructions and advised to limit weightbearing. She will return to the emergency department for any symptom of concern if she is unable schedule an appointment with her primary care provider.  Pertinent labs & imaging results that were available during my care of the patient were reviewed by me and considered in my medical decision making (see chart for details).  _________________________________________   FINAL CLINICAL IMPRESSION(S) / ED DIAGNOSES  Final diagnoses:  Laceration of right great toe without foreign body present or damage to nail, initial encounter    Discharge Medication List as of 02/28/2017 11:12 PM      If controlled substance prescribed during this visit, 12 month history viewed on the [place] prior to issuing an initial prescription for Schedule II or III opiod.    Victorino Dike, FNP 03/01/17 1900    Harvest Dark, MD 03/03/17 [phone]

## 2017-03-01 NOTE — ED Notes (Signed)
Dressing applied after bacitracin ointment by this RN prior to dc.

## 2017-03-09 ENCOUNTER — Encounter: Payer: Self-pay | Admitting: Unknown Physician Specialty

## 2017-03-09 ENCOUNTER — Ambulatory Visit (INDEPENDENT_AMBULATORY_CARE_PROVIDER_SITE_OTHER): Payer: Medicaid Other | Admitting: Unknown Physician Specialty

## 2017-03-09 VITALS — Temp 98.2°F | Wt 176.0 lb

## 2017-03-09 DIAGNOSIS — J029 Acute pharyngitis, unspecified: Secondary | ICD-10-CM

## 2017-03-09 DIAGNOSIS — Z4802 Encounter for removal of sutures: Secondary | ICD-10-CM | POA: Diagnosis not present

## 2017-03-09 MED ORDER — AZITHROMYCIN 250 MG PO TABS: ORAL_TABLET | ORAL | 0 refills | Status: DC

## 2017-03-09 NOTE — Progress Notes (Signed)
Temp 98.2 F (36.8 C)   Wt 176 lb (79.8 kg)   SpO2 97%   BMI 27.57 kg/m    Subjective:    Patient ID: Ellen Jackson, female    DOB: 07-08-1991, 26 y.o.   MRN: [id number]  HPI: Ellen Jackson is a 26 y.o. female  Chief Complaint  Patient presents with  . Sore Throat    pt states her throat has been really swollen and sore for about 3 days  . Suture / Staple Removal    pt states she got sticthes 9 days ago, states she was told to have them looked at between 10 to 12 days to see if they were ready to be removed. She would like the looked at today    Sore Throat   This is a new problem. Episode onset: 3 days. The problem has been gradually worsening. Neither side of throat is experiencing more pain than the other. There has been no fever. The pain is severe. Associated symptoms include coughing and a hoarse voice. Pertinent negatives include no congestion, shortness of breath or vomiting. She has tried acetaminophen, NSAIDs and gargles for the symptoms. The treatment provided no relief.   Sutures done in ER.  It's been 9 days.  She would like to have them removed  Relevant past medical, surgical, family and social history reviewed and updated as indicated. Interim medical history since our last visit reviewed. Allergies and medications reviewed and updated.  Review of Systems  HENT: Positive for hoarse voice. Negative for congestion.   Respiratory: Positive for cough. Negative for shortness of breath.   Gastrointestinal: Negative for vomiting.    Per HPI unless specifically indicated above     Objective:    Temp 98.2 F (36.8 C)   Wt 176 lb (79.8 kg)   SpO2 97%   BMI 27.57 kg/m   Wt Readings from Last 3 Encounters:  03/09/17 176 lb (79.8 kg)  02/28/17 179 lb (81.2 kg)  01/06/17 179 lb (81.2 kg)    Physical Exam  Constitutional: She is oriented to person, place, and time. She appears well-developed and well-nourished. No distress.  HENT:  Head: Normocephalic and  atraumatic.  Right Ear: Tympanic membrane and ear canal normal.  Left Ear: Tympanic membrane and ear canal normal.  Nose: Rhinorrhea present. Right sinus exhibits no maxillary sinus tenderness and no frontal sinus tenderness. Left sinus exhibits no maxillary sinus tenderness and no frontal sinus tenderness.  Mouth/Throat: Mucous membranes are normal. Posterior oropharyngeal edema and posterior oropharyngeal erythema present.  Eyes: Conjunctivae and lids are normal. Right eye exhibits no discharge. Left eye exhibits no discharge. No scleral icterus.  Cardiovascular: Normal rate and regular rhythm.   Pulmonary/Chest: Effort normal and breath sounds normal. No respiratory distress.  Abdominal: Normal appearance. There is no splenomegaly or hepatomegaly.  Musculoskeletal: Normal range of motion.  Neurological: She is alert and oriented to person, place, and time.  Skin: Skin is intact. No rash noted. No pallor.  Psychiatric: She has a normal mood and affect. Her behavior is normal. Judgment and thought content normal.   Strep positive  Results for orders placed or performed in visit on 01/06/17  Pregnancy, urine  Result Value Ref Range   Preg Test, Ur Negative Negative      Assessment & Plan:   Problem List Items Addressed This Visit    None    Visit Diagnoses    Sore throat    -  Primary  positive for strep.  Rx for Zithromax due to multiple allergies   Relevant Orders   Rapid strep screen (not at [facility])   Encounter for removal of sutures       3 sutures removed.  Edges of wound well apporximated.  No infection       Follow up plan: No Follow-up on file.

## 2017-03-18 LAB — RAPID STREP SCREEN (MED CTR MEBANE ONLY): STREP GP A AG, IA W/REFLEX: POSITIVE — AB

## 2017-04-05 ENCOUNTER — Ambulatory Visit: Payer: Medicaid Other | Admitting: Neurology

## 2017-04-13 ENCOUNTER — Encounter: Payer: Self-pay | Admitting: Family Medicine

## 2017-04-13 ENCOUNTER — Telehealth: Payer: Self-pay | Admitting: Family Medicine

## 2017-04-13 ENCOUNTER — Ambulatory Visit (INDEPENDENT_AMBULATORY_CARE_PROVIDER_SITE_OTHER): Payer: Medicaid Other | Admitting: Family Medicine

## 2017-04-13 VITALS — BP 117/80 | HR 88 | Temp 97.6°F | Wt 177.1 lb

## 2017-04-13 DIAGNOSIS — I471 Supraventricular tachycardia: Secondary | ICD-10-CM

## 2017-04-13 DIAGNOSIS — Z309 Encounter for contraceptive management, unspecified: Secondary | ICD-10-CM

## 2017-04-13 DIAGNOSIS — Z3042 Encounter for surveillance of injectable contraceptive: Secondary | ICD-10-CM

## 2017-04-13 LAB — PREGNANCY, URINE: Preg Test, Ur: NEGATIVE

## 2017-04-13 MED ORDER — METOPROLOL SUCCINATE ER 25 MG PO TB24: 37.5000 mg | ORAL_TABLET | Freq: Every day | ORAL | 3 refills | Status: DC

## 2017-04-13 MED ORDER — MEDROXYPROGESTERONE ACETATE 150 MG/ML IM SUSP
150.0000 mg | INTRAMUSCULAR | Status: AC
  Administered 2017-04-13: 150 mg via INTRAMUSCULAR

## 2017-04-13 NOTE — Progress Notes (Signed)
BP 117/80 (BP Location: Left Arm, Patient Position: Sitting, Cuff Size: Normal)   Pulse 88   Temp 97.6 F (36.4 C)   Wt 177 lb 2 oz (80.3 kg)   SpO2 100%   BMI 27.74 kg/m    Subjective:    Patient ID: Ellen Jackson, female    DOB: Dec 27, 1990, 26 y.o.   MRN: [id number]  HPI: Ellen Jackson is a 26 y.o. female  Chief Complaint  Patient presents with  . Tachycardia   PALPITATIONS- Came in today with her mother to get her depo shot. Felt like she was in SVT.  Duration: 9PM last night Symptom description: heart racing and pounding Duration of episode: hours Frequency: recurrentl Activity when event occurred: standing Related to exertion: yes Dyspnea: yes Chest pain: yes- early this AM Syncope: no Anxiety/stress: yes Nausea/vomiting: no vomiting, mild nausea Diaphoresis: yes Coronary artery disease: no Congestive heart failure: no Arrhythmia:yes Thyroid disease: no Caffeine intake:  a bit yesterday Status:  better Treatments attempted:none   Relevant past medical, surgical, family and social history reviewed and updated as indicated. Interim medical history since our last visit reviewed. Allergies and medications reviewed and updated.  Review of Systems  Constitutional: Negative.   Respiratory: Negative.   Cardiovascular: Positive for chest pain and palpitations. Negative for leg swelling.  Psychiatric/Behavioral: Negative for agitation, behavioral problems, confusion, decreased concentration, dysphoric mood, hallucinations, self-injury, sleep disturbance and suicidal ideas. The patient is nervous/anxious. The patient is not hyperactive.     Per HPI unless specifically indicated above     Objective:    BP 117/80 (BP Location: Left Arm, Patient Position: Sitting, Cuff Size: Normal)   Pulse 88   Temp 97.6 F (36.4 C)   Wt 177 lb 2 oz (80.3 kg)   SpO2 100%   BMI 27.74 kg/m   Wt Readings from Last 3 Encounters:  04/13/17 177 lb 2 oz (80.3 kg)  03/09/17 176 lb  (79.8 kg)  02/28/17 179 lb (81.2 kg)    Physical Exam  Constitutional: She is oriented to person, place, and time. She appears well-developed and well-nourished. No distress.  HENT:  Head: Normocephalic and atraumatic.  Right Ear: Hearing normal.  Left Ear: Hearing normal.  Nose: Nose normal.  Eyes: Conjunctivae and lids are normal. Right eye exhibits no discharge. Left eye exhibits no discharge. No scleral icterus.  Cardiovascular: Normal rate, regular rhythm, normal heart sounds and intact distal pulses.  Exam reveals no gallop and no friction rub.   No murmur heard. Pulmonary/Chest: Effort normal and breath sounds normal. No respiratory distress. She has no wheezes. She has no rales. She exhibits no tenderness.  Musculoskeletal: Normal range of motion.  Neurological: She is alert and oriented to person, place, and time.  Skin: Skin is warm, dry and intact. No rash noted. She is not diaphoretic. No erythema. No pallor.  Psychiatric: She has a normal mood and affect. Her speech is normal and behavior is normal. Judgment and thought content normal. Cognition and memory are normal.  Nursing note and vitals reviewed.   Results for orders placed or performed in visit on 03/09/17  Rapid strep screen (not at [facility])  Result Value Ref Range   Strep Gp A Ag, IA W/Reflex Positive (A) Negative      Assessment & Plan:   Problem List Items Addressed This Visit      Cardiovascular and Mediastinum   Paroxysmal supraventricular tachycardia ([place]) - Primary    Not in SVT today. Discussed  SVT with patient. Will increase her metoprolol to 37.5mg  daily and recheck 2-3 weeks. Increase fluids. Decrease caffeine. Call with any concerns.       Relevant Medications   metoprolol succinate (TOPROL XL) 25 MG 24 hr tablet   Other Relevant Orders   EKG 12-Lead (Completed)    Other Visit Diagnoses    Encounter for contraceptive management, unspecified type       Late for her depo. Pregnancy negative  today. Depo given.    Relevant Medications   medroxyPROGESTERone (DEPO-PROVERA) injection 150 mg   Other Relevant Orders   Pregnancy, urine       Follow up plan: Return 2-3 weeks, for follow up HR and BP.

## 2017-04-13 NOTE — Assessment & Plan Note (Signed)
Not in SVT today. Discussed SVT with patient. Will increase her metoprolol to 37.5mg  daily and recheck 2-3 weeks. Increase fluids. Decrease caffeine. Call with any concerns.

## 2017-04-13 NOTE — Telephone Encounter (Signed)
She needs to be seen to see how fast she is, if we can't slow her down, she will need to go to the ER as we don't have adenosine here.

## 2017-04-13 NOTE — Telephone Encounter (Signed)
Patient would like to get her DEPO and also wanted to let you know she has SVT attack.  She is at the office now waiting to get an answer  Thank you

## 2017-04-13 NOTE — Telephone Encounter (Signed)
Routing to provider Dr.Johnson, is there anything that we can and or need to do since she is in SVT?

## 2017-04-14 ENCOUNTER — Ambulatory Visit: Payer: Medicaid Other | Admitting: Family Medicine

## 2017-04-29 ENCOUNTER — Ambulatory Visit: Payer: Medicaid Other | Admitting: Family Medicine

## 2017-05-19 ENCOUNTER — Emergency Department
Admission: EM | Admit: 2017-05-19 | Discharge: 2017-05-19 | Disposition: A | Discharge status: Home / Self Care | Payer: Medicaid Other | Attending: Emergency Medicine | Admitting: Emergency Medicine

## 2017-05-19 ENCOUNTER — Encounter: Payer: Self-pay | Admitting: *Deleted

## 2017-05-19 DIAGNOSIS — Z5321 Procedure and treatment not carried out due to patient leaving prior to being seen by health care provider: Secondary | ICD-10-CM | POA: Diagnosis not present

## 2017-05-19 DIAGNOSIS — R51 Headache: Secondary | ICD-10-CM | POA: Insufficient documentation

## 2017-05-19 NOTE — ED Notes (Signed)
Pt called to be roomed without answer. Looked in waiting area and outside, pt not found.

## 2017-05-19 NOTE — ED Triage Notes (Signed)
Pt has a headache since yesterday.  States taking otc meds without relief.  Pt ambulates without diff.  Pt alert  Speech clear

## 2017-05-19 NOTE — ED Notes (Signed)
Pt called to be roomed x 2 without answer.

## 2017-05-21 ENCOUNTER — Other Ambulatory Visit: Payer: Self-pay | Admitting: Unknown Physician Specialty

## 2017-05-22 ENCOUNTER — Other Ambulatory Visit: Payer: Self-pay | Admitting: Unknown Physician Specialty

## 2017-05-28 ENCOUNTER — Ambulatory Visit (INDEPENDENT_AMBULATORY_CARE_PROVIDER_SITE_OTHER): Payer: Medicaid Other | Admitting: Family Medicine

## 2017-05-28 ENCOUNTER — Encounter: Payer: Self-pay | Admitting: Family Medicine

## 2017-05-28 VITALS — BP 116/80 | HR 90 | Temp 98.4°F | Wt 183.0 lb

## 2017-05-28 DIAGNOSIS — R21 Rash and other nonspecific skin eruption: Secondary | ICD-10-CM

## 2017-05-28 MED ORDER — TRIAMCINOLONE ACETONIDE 0.1 % EX CREA: 1.0000 "application " | TOPICAL_CREAM | Freq: Two times a day (BID) | CUTANEOUS | 0 refills | Status: DC

## 2017-05-28 NOTE — Progress Notes (Signed)
   BP 116/80   Pulse 90   Temp 98.4 F (36.9 C) (Oral)   Wt 183 lb (83 kg)   SpO2 98%   BMI 28.66 kg/m    Subjective:    Patient ID: Ellen Jackson, female    DOB: Mar 16, 1991, 26 y.o.   MRN: [id number]  HPI: Ellen Jackson is a 26 y.o. female  Chief Complaint  Patient presents with  . Insect Bite    Left arm, Bruised, painful. x 2 days.    Patient presents with bruised and sore insect bite on posterior left upper arm. Has multiple other bites in same area that did not react this way. Using neosporin on the area. No heat, redness, fever, chills, sweats, aches. Unsure of what bit her. Never had this reaction   Relevant past medical, surgical, family and social history reviewed and updated as indicated. Interim medical history since our last visit reviewed. Allergies and medications reviewed and updated.  Review of Systems  Constitutional: Negative.   HENT: Negative.   Respiratory: Negative.   Cardiovascular: Negative.   Gastrointestinal: Negative.   Musculoskeletal: Negative.   Skin:       Bruised insect bite site  Neurological: Negative.   Psychiatric/Behavioral: Negative.     Per HPI unless specifically indicated above     Objective:    BP 116/80   Pulse 90   Temp 98.4 F (36.9 C) (Oral)   Wt 183 lb (83 kg)   SpO2 98%   BMI 28.66 kg/m   Wt Readings from Last 3 Encounters:  05/28/17 183 lb (83 kg)  05/19/17 169 lb (76.7 kg)  04/13/17 177 lb 2 oz (80.3 kg)    Physical Exam  Constitutional: She is oriented to person, place, and time. She appears well-developed and well-nourished. No distress.  HENT:  Head: Atraumatic.  Eyes: Pupils are equal, round, and reactive to light. Conjunctivae are normal.  Neck: Normal range of motion. Neck supple.  Cardiovascular: Normal rate and normal heart sounds.   Pulmonary/Chest: Effort normal and breath sounds normal. No respiratory distress.  Musculoskeletal: Normal range of motion.  Lymphadenopathy:    She has no cervical  adenopathy.  Neurological: She is alert and oriented to person, place, and time.  Skin: Skin is warm and dry.  Circumferential bruising around what appears to be an insect bite on posterior left upper arm Multiple similar bites without the bruising in the area  Psychiatric: She has a normal mood and affect. Her behavior is normal.  Nursing note and vitals reviewed.     Assessment & Plan:   Problem List Items Addressed This Visit    None    Visit Diagnoses    Insect bite, initial encounter    -  Primary   No evidence of infection or skin breakdown at site, just appears bruised. Ice, triamcinolone cream. Monitor for worsening discoloration, wound formation, fever       Follow up plan: Return if symptoms worsen or fail to improve.

## 2017-05-29 NOTE — Patient Instructions (Signed)
Follow up as needed

## 2017-05-31 ENCOUNTER — Telehealth: Payer: Self-pay | Admitting: Surgery

## 2017-05-31 NOTE — Telephone Encounter (Addendum)
Patient called said she had surgery with Dr. Hampton Abbot over six months ago and said she is having discomfort, and pain going down belly button, pain level being at a five. Please call patient and advice.

## 2017-05-31 NOTE — Telephone Encounter (Signed)
Spoke with patient at this time. She states that she has pain similar to prior to surgery at umbilical site. Also has new bulge at prior umbilical hernia repair. Placed on schedule to see Dr. Hampton Abbot, operating surgeon. 1st available appointment that was convenient to the patient.

## 2017-06-03 ENCOUNTER — Encounter: Payer: Self-pay | Admitting: Family Medicine

## 2017-06-03 ENCOUNTER — Ambulatory Visit (INDEPENDENT_AMBULATORY_CARE_PROVIDER_SITE_OTHER): Payer: Medicaid Other | Admitting: Family Medicine

## 2017-06-03 VITALS — BP 107/73 | HR 91 | Temp 98.2°F | Wt 183.0 lb

## 2017-06-03 DIAGNOSIS — L739 Follicular disorder, unspecified: Secondary | ICD-10-CM

## 2017-06-03 MED ORDER — CHLORHEXIDINE GLUCONATE 4 % EX LIQD: Freq: Every day | CUTANEOUS | 0 refills | Status: DC | PRN

## 2017-06-03 MED ORDER — SULFAMETHOXAZOLE-TRIMETHOPRIM 800-160 MG PO TABS: 1.0000 | ORAL_TABLET | Freq: Two times a day (BID) | ORAL | 0 refills | Status: DC

## 2017-06-03 NOTE — Patient Instructions (Signed)
Follow up as needed

## 2017-06-03 NOTE — Progress Notes (Signed)
   BP 107/73 (BP Location: Right Arm, Patient Position: Sitting, Cuff Size: Normal)   Pulse 91   Temp 98.2 F (36.8 C)   Wt 183 lb (83 kg)   SpO2 99%   BMI 28.66 kg/m    Subjective:    Patient ID: Ellen Jackson, female    DOB: Jan 10, 1991, 26 y.o.   MRN: [id number]  HPI: Ellen Jackson is a 26 y.o. female  Chief Complaint  Patient presents with  . Mass    Sores/Bumps in between breasts. Patient states it's infected. x's 1 month.   Patient presents with some infected bumps between her breasts that have been present with intermittent flares x 1 month. Has been using neosporin with some relief. No hx of this happening, but does note she sweats a lot in this area and scratched off a white bump that came up around there at the time of onset. Denies fever, chills, sweats.   Relevant past medical, surgical, family and social history reviewed and updated as indicated. Interim medical history since our last visit reviewed. Allergies and medications reviewed and updated.  Review of Systems  Constitutional: Negative.   HENT: Negative.   Respiratory: Negative.   Cardiovascular: Negative.   Gastrointestinal: Negative.   Genitourinary: Negative.   Musculoskeletal: Negative.   Skin:       Pustules between breasts  Neurological: Negative.   Psychiatric/Behavioral: Negative.    Per HPI unless specifically indicated above     Objective:    BP 107/73 (BP Location: Right Arm, Patient Position: Sitting, Cuff Size: Normal)   Pulse 91   Temp 98.2 F (36.8 C)   Wt 183 lb (83 kg)   SpO2 99%   BMI 28.66 kg/m   Wt Readings from Last 3 Encounters:  06/03/17 183 lb (83 kg)  05/28/17 183 lb (83 kg)  05/19/17 169 lb (76.7 kg)    Physical Exam  Constitutional: She is oriented to person, place, and time. She appears well-developed and well-nourished. No distress.  HENT:  Head: Atraumatic.  Eyes: Conjunctivae are normal. No scleral icterus.  Neck: Normal range of motion. Neck supple.    Cardiovascular: Normal rate and normal heart sounds.   Pulmonary/Chest: Effort normal and breath sounds normal. No respiratory distress.  Musculoskeletal: Normal range of motion.  Neurological: She is alert and oriented to person, place, and time.  Skin: Skin is warm and dry.  Multiple pustules with mild surrounding erythema between breasts at midline.   Psychiatric: She has a normal mood and affect. Her behavior is normal.  Nursing note and vitals reviewed.     Assessment & Plan:   Problem List Items Addressed This Visit    None    Visit Diagnoses    Folliculitis    -  Primary   Will treat flare with bactrim, neosporin, hibiclens wash. Keep area clean and dry, wash twice daily with mild soap and water. Change bras often when sweating       Follow up plan: Return if symptoms worsen or fail to improve.

## 2017-06-19 ENCOUNTER — Observation Stay
Admission: EM | Admit: 2017-06-19 | Discharge: 2017-06-23 | Disposition: A | Discharge status: Home / Self Care | Payer: Medicaid Other | Attending: Orthopedic Surgery | Admitting: Orthopedic Surgery

## 2017-06-19 ENCOUNTER — Emergency Department: Payer: Medicaid Other

## 2017-06-19 DIAGNOSIS — Z309 Encounter for contraceptive management, unspecified: Secondary | ICD-10-CM

## 2017-06-19 DIAGNOSIS — Z91040 Latex allergy status: Secondary | ICD-10-CM | POA: Diagnosis not present

## 2017-06-19 DIAGNOSIS — Q760 Spina bifida occulta: Secondary | ICD-10-CM | POA: Diagnosis not present

## 2017-06-19 DIAGNOSIS — G43109 Migraine with aura, not intractable, without status migrainosus: Secondary | ICD-10-CM | POA: Insufficient documentation

## 2017-06-19 DIAGNOSIS — S82891A Other fracture of right lower leg, initial encounter for closed fracture: Secondary | ICD-10-CM | POA: Diagnosis present

## 2017-06-19 DIAGNOSIS — Z823 Family history of stroke: Secondary | ICD-10-CM | POA: Insufficient documentation

## 2017-06-19 DIAGNOSIS — Z880 Allergy status to penicillin: Secondary | ICD-10-CM | POA: Insufficient documentation

## 2017-06-19 DIAGNOSIS — S9304XA Dislocation of right ankle joint, initial encounter: Secondary | ICD-10-CM | POA: Insufficient documentation

## 2017-06-19 DIAGNOSIS — F419 Anxiety disorder, unspecified: Secondary | ICD-10-CM | POA: Diagnosis not present

## 2017-06-19 DIAGNOSIS — Z91048 Other nonmedicinal substance allergy status: Secondary | ICD-10-CM | POA: Insufficient documentation

## 2017-06-19 DIAGNOSIS — Z888 Allergy status to other drugs, medicaments and biological substances status: Secondary | ICD-10-CM | POA: Diagnosis not present

## 2017-06-19 DIAGNOSIS — S93411A Sprain of calcaneofibular ligament of right ankle, initial encounter: Secondary | ICD-10-CM | POA: Insufficient documentation

## 2017-06-19 DIAGNOSIS — M25571 Pain in right ankle and joints of right foot: Secondary | ICD-10-CM | POA: Diagnosis present

## 2017-06-19 DIAGNOSIS — W172XXA Fall into hole, initial encounter: Secondary | ICD-10-CM | POA: Diagnosis not present

## 2017-06-19 DIAGNOSIS — Z7982 Long term (current) use of aspirin: Secondary | ICD-10-CM | POA: Diagnosis not present

## 2017-06-19 DIAGNOSIS — E7212 Methylenetetrahydrofolate reductase deficiency: Secondary | ICD-10-CM | POA: Diagnosis not present

## 2017-06-19 DIAGNOSIS — S93491A Sprain of other ligament of right ankle, initial encounter: Secondary | ICD-10-CM | POA: Diagnosis not present

## 2017-06-19 DIAGNOSIS — M797 Fibromyalgia: Secondary | ICD-10-CM | POA: Diagnosis not present

## 2017-06-19 DIAGNOSIS — Z793 Long term (current) use of hormonal contraceptives: Secondary | ICD-10-CM | POA: Diagnosis not present

## 2017-06-19 DIAGNOSIS — K589 Irritable bowel syndrome without diarrhea: Secondary | ICD-10-CM | POA: Diagnosis not present

## 2017-06-19 DIAGNOSIS — S82853A Displaced trimalleolar fracture of unspecified lower leg, initial encounter for closed fracture: Secondary | ICD-10-CM

## 2017-06-19 DIAGNOSIS — F17210 Nicotine dependence, cigarettes, uncomplicated: Secondary | ICD-10-CM | POA: Diagnosis not present

## 2017-06-19 DIAGNOSIS — Z8249 Family history of ischemic heart disease and other diseases of the circulatory system: Secondary | ICD-10-CM | POA: Insufficient documentation

## 2017-06-19 DIAGNOSIS — Z8261 Family history of arthritis: Secondary | ICD-10-CM | POA: Insufficient documentation

## 2017-06-19 DIAGNOSIS — Z885 Allergy status to narcotic agent status: Secondary | ICD-10-CM | POA: Insufficient documentation

## 2017-06-19 DIAGNOSIS — Z79899 Other long term (current) drug therapy: Secondary | ICD-10-CM | POA: Insufficient documentation

## 2017-06-19 DIAGNOSIS — Z7952 Long term (current) use of systemic steroids: Secondary | ICD-10-CM | POA: Insufficient documentation

## 2017-06-19 DIAGNOSIS — Z9049 Acquired absence of other specified parts of digestive tract: Secondary | ICD-10-CM | POA: Insufficient documentation

## 2017-06-19 DIAGNOSIS — F329 Major depressive disorder, single episode, unspecified: Secondary | ICD-10-CM | POA: Diagnosis not present

## 2017-06-19 DIAGNOSIS — S82851A Displaced trimalleolar fracture of right lower leg, initial encounter for closed fracture: Secondary | ICD-10-CM | POA: Diagnosis present

## 2017-06-19 DIAGNOSIS — K219 Gastro-esophageal reflux disease without esophagitis: Secondary | ICD-10-CM | POA: Diagnosis not present

## 2017-06-19 DIAGNOSIS — I471 Supraventricular tachycardia: Secondary | ICD-10-CM | POA: Diagnosis not present

## 2017-06-19 DIAGNOSIS — S82899A Other fracture of unspecified lower leg, initial encounter for closed fracture: Secondary | ICD-10-CM | POA: Diagnosis present

## 2017-06-19 DIAGNOSIS — Z833 Family history of diabetes mellitus: Secondary | ICD-10-CM | POA: Insufficient documentation

## 2017-06-19 DIAGNOSIS — Z800 Family history of malignant neoplasm of digestive organs: Secondary | ICD-10-CM | POA: Insufficient documentation

## 2017-06-19 DIAGNOSIS — Z820 Family history of epilepsy and other diseases of the nervous system: Secondary | ICD-10-CM | POA: Insufficient documentation

## 2017-06-19 DIAGNOSIS — Z9889 Other specified postprocedural states: Secondary | ICD-10-CM | POA: Insufficient documentation

## 2017-06-19 DIAGNOSIS — Z818 Family history of other mental and behavioral disorders: Secondary | ICD-10-CM | POA: Insufficient documentation

## 2017-06-19 HISTORY — DX: Other fracture of right lower leg, initial encounter for closed fracture: S82.891A

## 2017-06-19 LAB — CBC WITH DIFFERENTIAL/PLATELET
Basophils Absolute: 0 10*3/uL (ref 0–0.1)
Basophils Relative: 1 %
EOS ABS: 0.4 10*3/uL (ref 0–0.7)
Eosinophils Relative: 5 %
HEMATOCRIT: 36.5 % (ref 35.0–47.0)
HEMOGLOBIN: 12.7 g/dL (ref 12.0–16.0)
LYMPHS ABS: 2.3 10*3/uL (ref 1.0–3.6)
LYMPHS PCT: 27 %
MCH: 31.2 pg (ref 26.0–34.0)
MCHC: 34.7 g/dL (ref 32.0–36.0)
MCV: 89.9 fL (ref 80.0–100.0)
MONOS PCT: 7 %
Monocytes Absolute: 0.6 10*3/uL (ref 0.2–0.9)
NEUTROS ABS: 5.4 10*3/uL (ref 1.4–6.5)
NEUTROS PCT: 60 %
Platelets: 184 10*3/uL (ref 150–440)
RBC: 4.06 MIL/uL (ref 3.80–5.20)
RDW: 13.2 % (ref 11.5–14.5)
WBC: 8.8 10*3/uL (ref 3.6–11.0)

## 2017-06-19 LAB — PROTIME-INR
INR: 0.97
Prothrombin Time: 12.8 seconds (ref 11.4–15.2)

## 2017-06-19 LAB — APTT: APTT: 25 s (ref 24–36)

## 2017-06-19 MED ORDER — SODIUM CHLORIDE 0.9 % IV SOLN
INTRAVENOUS | Status: DC
  Administered 2017-06-19: 23:00:00 via INTRAVENOUS

## 2017-06-19 MED ORDER — CLINDAMYCIN PHOSPHATE 600 MG/50ML IV SOLN
600.0000 mg | INTRAVENOUS | Status: AC
  Administered 2017-06-20: 600 mg via INTRAVENOUS
  Filled 2017-06-19: qty 50

## 2017-06-19 MED ORDER — INFLUENZA VAC SPLIT QUAD 0.5 ML IM SUSY: 0.5000 mL | PREFILLED_SYRINGE | INTRAMUSCULAR | Status: DC

## 2017-06-19 MED ORDER — METHOCARBAMOL 1000 MG/10ML IJ SOLN
500.0000 mg | Freq: Four times a day (QID) | INTRAVENOUS | Status: DC | PRN
  Filled 2017-06-19: qty 5

## 2017-06-19 MED ORDER — ONDANSETRON HCL 4 MG/2ML IJ SOLN
4.0000 mg | Freq: Three times a day (TID) | INTRAMUSCULAR | Status: DC
  Filled 2017-06-19 (×6): qty 2

## 2017-06-19 MED ORDER — OXYCODONE-ACETAMINOPHEN 5-325 MG PO TABS
1.0000 | ORAL_TABLET | Freq: Once | ORAL | Status: AC
  Administered 2017-06-19: 1 via ORAL
  Filled 2017-06-19: qty 1

## 2017-06-19 MED ORDER — HYDROMORPHONE HCL 1 MG/ML IJ SOLN: 1.0000 mg | Freq: Once | INTRAMUSCULAR | Status: DC

## 2017-06-19 MED ORDER — HYDROMORPHONE HCL 1 MG/ML IJ SOLN
0.5000 mg | INTRAMUSCULAR | Status: DC | PRN
  Administered 2017-06-20 – 2017-06-22 (×12): 0.5 mg via INTRAVENOUS
  Filled 2017-06-19 (×12): qty 1

## 2017-06-19 MED ORDER — OXYCODONE HCL 5 MG PO TABS
5.0000 mg | ORAL_TABLET | ORAL | Status: DC | PRN
  Administered 2017-06-19: 5 mg via ORAL
  Administered 2017-06-20: 10 mg via ORAL
  Administered 2017-06-20: 5 mg via ORAL
  Filled 2017-06-19: qty 1
  Filled 2017-06-19: qty 2
  Filled 2017-06-19: qty 1

## 2017-06-19 MED ORDER — BUPIVACAINE HCL 0.25 % IJ SOLN
10.0000 mL | Freq: Once | INTRAMUSCULAR | Status: AC
  Administered 2017-06-19: 10 mL
  Filled 2017-06-19: qty 10

## 2017-06-19 MED ORDER — FENTANYL CITRATE (PF) 100 MCG/2ML IJ SOLN
100.0000 ug | Freq: Once | INTRAMUSCULAR | Status: AC
  Administered 2017-06-19: 100 ug via INTRAVENOUS
  Filled 2017-06-19: qty 2

## 2017-06-19 MED ORDER — DIPHENHYDRAMINE HCL 50 MG/ML IJ SOLN: 25.0000 mg | Freq: Once | INTRAMUSCULAR | Status: DC

## 2017-06-19 MED ORDER — LIDOCAINE HCL (PF) 1 % IJ SOLN
15.0000 mL | Freq: Once | INTRAMUSCULAR | Status: AC
  Administered 2017-06-19: 15 mL
  Filled 2017-06-19: qty 15

## 2017-06-19 MED ORDER — METHOCARBAMOL 500 MG PO TABS: 500.0000 mg | ORAL_TABLET | Freq: Four times a day (QID) | ORAL | Status: DC | PRN

## 2017-06-19 MED ORDER — ACETAMINOPHEN 325 MG PO TABS
650.0000 mg | ORAL_TABLET | Freq: Four times a day (QID) | ORAL | Status: DC | PRN
  Administered 2017-06-20: 650 mg via ORAL
  Filled 2017-06-19: qty 2

## 2017-06-19 NOTE — ED Notes (Signed)
Pt assisted onto bedpan to urinated and tolerated well. Clothes placed in belongings bag and pt had on one bra, two pairs of pants and one sweatshirt. Pt kept on one cami top and placed only one shoe in belongings bag. Pt reports her mother took the other shoe.

## 2017-06-19 NOTE — ED Provider Notes (Signed)
[facility] Emergency Department Provider Note   ____________________________________________   I have reviewed the triage vital signs and the nursing notes.   HISTORY  Chief Complaint Ankle Pain    HPI Ellen Jackson is a 26 y.o. female presents to the emergency department with right ankle pain after stepping in a hole at the [facility] park while walking to the fireworks. Patient reports having stepping in the hole she noted her foot everting and hearing a very loud pop. She was unable to weight-bear or walking following the injury. Patient report remote third degree inversion sprain injury of the same ankle. Patient denies sustaining any other injury. EMS providers immobilized the ankle with a long leg splint, and patient received Fentanyl for pain management. Sensation and pulses intake in the right ankle since the injury. Patient denies fever, chills, headache, vision changes, chest pain, chest tightness, shortness of breath, abdominal pain, nausea and vomiting.  Past Medical History:  Diagnosis Date  . Allergy   . Anxiety   . Dysrhythmia   . Fibromyalgia    everywhere  . GERD (gastroesophageal reflux disease)   . Hernia, umbilical   . Heterozygous MTHFR mutation C677T ([place])   . IBS (irritable bowel syndrome)   . Migraine with aura    bc powders is only thing that works.  . Ovarian cyst   . Spina bifida occulta   . SVT (supraventricular tachycardia) [facility])     Patient Active Problem List   Diagnosis Date Noted  . S/P umbilical hernia repair, follow-up exam 10/19/2016  . Exertional dyspnea 10/14/2016  . Recurrent umbilical hernia 87/56/4332  . Fibromyalgia 07/30/2016  . Anxiety 02/05/2015  . Headache, migraine 02/05/2015  . Clinical depression 02/05/2015  . 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) 02/05/2015  . 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 Paroxysmal supraventricular tachycardia ([place]) 02/05/2015  . LLQ abdominal pain  02/05/2015  . Anxiety disorder 02/05/2015  . Major depressive disorder, single episode 02/05/2015  . Gonalgia 10/16/2014  . Pain in joint involving lower leg 10/16/2014    Past Surgical History:  Procedure Laterality Date  . APPENDECTOMY  2015  . CESAREAN SECTION  2011  . CHOLECYSTECTOMY  2005  . HERNIA REPAIR  9518   Umbilical Hernia Repair with mesh  . KNEE ARTHROSCOPY Left 2015   X 3. plate and pin in left shin placed after several surgeries. last surgery for this was 20154  . OVARIAN CYST SURGERY    . UMBILICAL HERNIA REPAIR N/A 10/09/2016   Procedure: HERNIA REPAIR UMBILICAL ADULT;  Surgeon: Olean Ree, MD;  Location: [facility] ORS;  Service: General;  Laterality: N/A;    Prior to Admission medications   Medication Sig Start Date End Date Taking? Authorizing Provider  albuterol (PROVENTIL HFA;VENTOLIN HFA) 108 (90 Base) MCG/ACT inhaler Inhale 2 puffs into the lungs every 6 (six) hours as needed for wheezing or shortness of breath. 11/17/16   Johnson, Megan P, DO  Aspirin-Salicylamide-Caffeine (BC FAST PAIN RELIEF) 650-195-33.3 MG PACK Take 1 packet by mouth every 8 (eight) hours as needed (pain).     [provider]  chlorhexidine (HIBICLENS) 4 % external liquid Apply topically daily as needed. 06/03/17   Volney American, PA-C  cyclobenzaprine (FLEXERIL) 10 MG tablet Take 1 tablet (10 mg total) by mouth 3 (three) times daily as needed for muscle spasms. Patient not taking: Reported on 03/09/2017 10/16/16   Caroleen Hamman F, MD  ibuprofen (ADVIL,MOTRIN) 600 MG tablet Take 1 tablet (600  mg total) by mouth every 8 (eight) hours as needed for fever or mild pain. 10/09/16   Olean Ree, MD  LYRICA 150 MG capsule TAKE ONE CAPSULE BY MOUTH TWICE A DAY 05/24/17   Kathrine Haddock, NP  medroxyPROGESTERone (DEPO-PROVERA) 150 MG/ML injection Inject 150 mg into the muscle every 3 (three) months. Next injection due 12-2016    [provider]  metoprolol succinate (TOPROL XL) 25  MG 24 hr tablet Take 1.5 tablets (37.5 mg total) by mouth daily. 04/13/17   Johnson, Megan P, DO  polyethylene glycol (MIRALAX / GLYCOLAX) packet Take 17 g by mouth 2 (two) times daily as needed for mild constipation.     [provider]  pregabalin (LYRICA) 75 MG capsule Take 1 capsule (75 mg total) by mouth 2 (two) times daily. 11/24/16   Johnson, Megan P, DO  ranitidine (ZANTAC) 150 MG tablet Take 1 tablet (150 mg total) by mouth daily as needed for heartburn. 07/02/16   Johnson, Megan P, DO  sulfamethoxazole-trimethoprim (BACTRIM DS,SEPTRA DS) 800-160 MG tablet Take 1 tablet by mouth 2 (two) times daily. 06/03/17   Volney American, PA-C  SUMAtriptan (IMITREX) 100 MG tablet Take 1 tablet earliest onset of migraine.  May repeat once in 2 hours if headache persists or recurs.  Do not exceed 2 tablets in 24 hours 12/01/16   Metta Clines R, DO  triamcinolone cream (KENALOG) 0.1 % Apply 1 application topically 2 (two) times daily. 05/28/17   Volney American, PA-C  venlafaxine XR (EFFEXOR XR) 37.5 MG 24 hr capsule Take 1 capsule daily with breakfast for 7 days, then increase to 2 capsules daily with breakfast. 02/01/17   Pieter Partridge, DO    Allergies Codeine; Latex; Morphine; Nalbuphine; Ondansetron hcl; Other; Penicillins; Metoclopramide; Prochlorperazine; Promethazine; Tape; and Tizanidine  Family History  Problem Relation Age of Onset  . Arthritis Mother   . Hyperlipidemia Mother   . Hypertension Mother   . Migraines Mother   . Arthritis Father   . Autism Son   . Cancer Maternal Grandfather        pancreatic  . Stroke Maternal Grandfather   . Diabetes Paternal Grandmother     Social History Social History  Substance Use Topics  . Smoking status: Current Every Day Smoker    Packs/day: 0.50    Years: 3.00    Types: Cigarettes  . Smokeless tobacco: Never Used  . Alcohol use No    Review of Systems Constitutional: Negative for fever/chills Eyes: No visual  changes. Respiratory:. Denies shortness of breath. Musculoskeletal: Positive for right ankle pain. Skin: Negative for rash. Neurological: Negative for headaches.  ____________________________________________   PHYSICAL EXAM:  VITAL SIGNS: ED Triage Vitals [06/19/17 1903]  Enc Vitals Group     BP 124/79     Pulse Rate 89     Resp 19     Temp 98.3 F (36.8 C)     Temp Source Oral     SpO2 100 %     Weight 183 lb (83 kg)     Height 5\' 7"  (1.702 m)     Head Circumference      Peak Flow      Pain Score 9     Pain Loc      Pain Edu?      Excl. in [place]?     Constitutional: Alert and oriented. Well appearing and in no acute distress.  Head: Normocephalic and atraumatic. Cardiovascular: Normal rate, regular rhythm. Respiratory: Normal  respiratory effort without tachypnea or retractions.  Musculoskeletal: Right ankle assessment deferred secondary to injury and ankle bony deformity present. Pedal and medial malleoli pulses intake. Right ankle in long leg splint.  Neurologic: Normal speech and language.  Skin:  Skin is warm, dry and intact. No rash noted. Psychiatric: Mood and affect are normal. Speech and behavior are normal. Patient exhibits appropriate insight and judgement.  ____________________________________________   LABS (all labs ordered are listed, but only abnormal results are displayed)  Labs Reviewed - No data to display ____________________________________________  EKG none ____________________________________________  RADIOLOGY DG right ankle complete FINDINGS: Displaced fractures are seen through the medial and posterior malleoli of the distal tibia. There is also a displaced oblique fracture of the distal fibular diaphysis. Posterior dislocation of the talus is also seen.  IMPRESSION: Ankle fracture- dislocation, as described above . ____________________________________________   PROCEDURES  Procedure(s) performed: no    Critical Care performed:  no ____________________________________________   INITIAL IMPRESSION / ASSESSMENT AND PLAN / ED COURSE  Pertinent labs & imaging results that were available during my care of the patient were reviewed by me and considered in my medical decision making (see chart for details).  Patient presents to emergency department with right ankle pain after stepping in a hole at the park earlier tonight. Patient physical exam and imaging findings are consistent with displaced fractures are seen through the medial and posterior malleoli of the distal tibia. There is also a displaced oblique fracture of the distal fibular diaphysis. Posterior dislocation of the talus is also seen. Dr. Mack Guise with Orthopedics consulted regarding fracture/dislocation.  Right ankle fracture/dislocation reduced and splinted by Dr. Mack Guise.  Dr. Mack Guise admitted patient to the floor and patient will be scheduled for surgical procedure at sometime tomorrow.  Patient care transferred. ----------------------------------------- 9:26 PM on 06/19/2017 -----------------------------------------      FINAL CLINICAL IMPRESSION(S) / ED DIAGNOSES  Final diagnoses:  Acute right ankle pain  Closed displaced trimalleolar fracture of right ankle, initial encounter       NEW MEDICATIONS STARTED DURING THIS VISIT:  New Prescriptions   No medications on file     Note:  This document was prepared using Dragon voice recognition software and may include unintentional dictation errors.    Jerolyn Shin, PA-C 06/19/17 2128    Schuyler Amor, MD 06/19/17 [phone]

## 2017-06-19 NOTE — ED Notes (Signed)
Ortho provider at  bedside ?

## 2017-06-19 NOTE — ED Notes (Signed)
OCL splint placed to right leg by Dr Mack Guise. Pt tolerated well. Cap refill WNL. + distal sensation present.

## 2017-06-19 NOTE — H&P (Signed)
PREOPERATIVE H&P  Chief Complaint: Right ankle pain   HPI: Ellen Jackson is a 26 y.o. female who presents for preoperative history and physical with a diagnosis of right trimalleolar ankle fracture dislocation. Patient [place] that [place]. Symptoms include severe right ankle pain.  X-rays taken in the ER upon admission demonstrate a trimalleolar ankle fracture dislocation. Given the displacement of the fracture and recommending surgical fixation of the fracture.  Past Medical History:  Diagnosis Date  . Allergy   . Anxiety   . Dysrhythmia   . Fibromyalgia    everywhere  . GERD (gastroesophageal reflux disease)   . Hernia, umbilical   . Heterozygous MTHFR mutation C677T ([place])   . IBS (irritable bowel syndrome)   . Migraine with aura    bc powders is only thing that works.  . Ovarian cyst   . Spina bifida occulta   . SVT (supraventricular tachycardia) ([facility])    Past Surgical History:  Procedure Laterality Date  . APPENDECTOMY  2015  . CESAREAN SECTION  2011  . CHOLECYSTECTOMY  2005  . HERNIA REPAIR  7672   Umbilical Hernia Repair with mesh  . KNEE ARTHROSCOPY Left 2015   X 3. plate and pin in left shin placed after several surgeries. last surgery for this was 20154  . OVARIAN CYST SURGERY    . UMBILICAL HERNIA REPAIR N/A 10/09/2016   Procedure: HERNIA REPAIR UMBILICAL ADULT;  Surgeon: Olean Ree, MD;  Location: [facility] ORS;  Service: General;  Laterality: N/A;   Social History   Social History  . Marital status: Single    Spouse name: N/A  . Number of children: N/A  . Years of education: N/A   Social History Main Topics  . Smoking status: Current Every Day Smoker    Packs/day: 0.50    Years: 3.00    Types: Cigarettes  . Smokeless tobacco: Never Used  . Alcohol use No  . Drug use: No  . Sexual activity: No   Other Topics Concern  . None   Social History Narrative  .  None   Family History  Problem Relation Age of Onset  . Arthritis Mother   . Hyperlipidemia Mother   . Hypertension Mother   . Migraines Mother   . Arthritis Father   . Autism Son   . Cancer Maternal Grandfather        pancreatic  . Stroke Maternal Grandfather   . Diabetes Paternal Grandmother    Allergies  Allergen Reactions  . Codeine     Other reaction(s): Other (See Comments) Passes out  . Latex Itching and Rash    Lips itching  . Morphine Itching and Shortness Of Breath  . Nalbuphine Itching and Shortness Of Breath  . Ondansetron Hcl Hives and Rash  . Other Hives    Allergic to steri strips. tegaderm ok.. Paper tape is ok. Clear plastic tape rips off skin  . Penicillins Rash    Patient reports severe rash and itching. Has patient had a PCN reaction causing immediate rash, facial/tongue/throat swelling, SOB or lightheadedness with hypotension: Yes Has patient had a PCN reaction causing severe rash involving mucus membranes or skin necrosis: No Has patient had a PCN reaction that required hospitalization unknown Has patient had a PCN reaction occurring within the last 10 years:  unknown If all of the above answers are "NO", then may proceed  . Metoclopramide  Anxiety    Other reaction(s): Other (See Comments) "jittery" Difficulty breathing  . Prochlorperazine Anxiety and Other (See Comments)    Difficulty breathing  . Promethazine Anxiety and Other (See Comments)    "jittery" Difficulty breathing     . Tape     See above ... Paper tape ok  . Tizanidine Other (See Comments)    Worsens headache   Prior to Admission medications   Medication Sig Start Date End Date Taking? Authorizing Provider  albuterol (PROVENTIL HFA;VENTOLIN HFA) 108 (90 Base) MCG/ACT inhaler Inhale 2 puffs into the lungs every 6 (six) hours as needed for wheezing or shortness of breath. 11/17/16   Johnson, Megan P, DO  Aspirin-Salicylamide-Caffeine (BC FAST PAIN RELIEF) 650-195-33.3 MG PACK Take  1 packet by mouth every 8 (eight) hours as needed (pain).     [provider]  chlorhexidine (HIBICLENS) 4 % external liquid Apply topically daily as needed. 06/03/17   Volney American, PA-C  cyclobenzaprine (FLEXERIL) 10 MG tablet Take 1 tablet (10 mg total) by mouth 3 (three) times daily as needed for muscle spasms. Patient not taking: Reported on 03/09/2017 10/16/16   Jules Husbands, MD  ibuprofen (ADVIL,MOTRIN) 600 MG tablet Take 1 tablet (600 mg total) by mouth every 8 (eight) hours as needed for fever or mild pain. 10/09/16   Olean Ree, MD  LYRICA 150 MG capsule TAKE ONE CAPSULE BY MOUTH TWICE A DAY 05/24/17   Kathrine Haddock, NP  medroxyPROGESTERone (DEPO-PROVERA) 150 MG/ML injection Inject 150 mg into the muscle every 3 (three) months. Next injection due 12-2016    [provider]  metoprolol succinate (TOPROL XL) 25 MG 24 hr tablet Take 1.5 tablets (37.5 mg total) by mouth daily. 04/13/17   Johnson, Megan P, DO  polyethylene glycol (MIRALAX / GLYCOLAX) packet Take 17 g by mouth 2 (two) times daily as needed for mild constipation.     [provider]  pregabalin (LYRICA) 75 MG capsule Take 1 capsule (75 mg total) by mouth 2 (two) times daily. 11/24/16   Johnson, Megan P, DO  ranitidine (ZANTAC) 150 MG tablet Take 1 tablet (150 mg total) by mouth daily as needed for heartburn. 07/02/16   Johnson, Megan P, DO  sulfamethoxazole-trimethoprim (BACTRIM DS,SEPTRA DS) 800-160 MG tablet Take 1 tablet by mouth 2 (two) times daily. 06/03/17   Volney American, PA-C  SUMAtriptan (IMITREX) 100 MG tablet Take 1 tablet earliest onset of migraine.  May repeat once in 2 hours if headache persists or recurs.  Do not exceed 2 tablets in 24 hours 12/01/16   Metta Clines R, DO  triamcinolone cream (KENALOG) 0.1 % Apply 1 application topically 2 (two) times daily. 05/28/17   Volney American, PA-C  venlafaxine XR (EFFEXOR XR) 37.5 MG 24 hr capsule Take 1 capsule daily with  breakfast for 7 days, then increase to 2 capsules daily with breakfast. 02/01/17   Pieter Partridge, DO     Positive ROS: All other systems have been reviewed and were otherwise negative with the exception of those mentioned in the HPI and as above.  Physical Exam: General: Alert, no acute distress Cardiovascular: Regular rate and rhythm, no murmurs rubs or gallops.  No pedal edema Respiratory: Clear to auscultation bilaterally, no wheezes rales or rhonchi. No cyanosis, no use of accessory musculature GI: No organomegaly, abdomen is soft and non-tender nondistended with positive bowel sounds. Skin: Skin intact, no lesions within the operative field. Neurologic: Sensation intact distally Psychiatric: Patient is  competent for consent with normal mood and affect Lymphatic: No cervical lymphadenopathy  MUSCULOSKELETAL: Right ankle: Patient's skin is intact. There is an extension deformity to the right ankle. Patient has palpable pedal pulses, intact sensation light touch and intact motor function. Her foot and leg compartments are soft and compressible.  Assessment: Displaced right trimalleolar ankle fracture dislocation  Plan:  I explained to the patient that she has broken her ankle in 3 places. She understands that her foot is dislocated currently. I'm recommending a closed reduction here in the ER and then surgical fixation as definitive management for her fracture.  I closed reduced patient's fracture in the ER. I administered a right intra-articular injection of lidocaine and Marcaine. Patient tolerated this well. She then had an AO splint applied after close reduction was performed with longitudinal traction and an anterior directed force to the posterior aspect of the foot.  Explained to the patient the details of the operation as well as postoperative course. We discussed discussed the risks and benefits of surgery. The risks include but are not limited to infection, bleeding requiring  blood transfusion, nerve or blood vessel injury, joint stiffness or loss of motion, persistent pain, weakness or instability, malunion, nonunion and hardware failure and the need for further surgery. Medical risks include but are not limited to DVT and pulmonary embolism, myocardial infarction, stroke, pneumonia, respiratory failure and death. Patient understood these risks and wished to proceed.   Patient will is scheduled for surgery first thing in the morning. She'll be nothing by mouth after midnight. Patient has multiple allergies to pain medications and anti-emetics.  We are going to try dilaudid and oxycodone for her pain.  Post reduction xrays will be taken in the ER before admitting her to the floor.       Thornton Park, MD   06/19/2017 9:08 PM

## 2017-06-19 NOTE — ED Triage Notes (Signed)
Per EMS [place] was [place] and was walking in the grass and stepped into a hole with right foot.  Pt states she heard a pop when she fell.  Pt states 9/10 pain.  Pt denies LOC.  Pt is A&Ox4.

## 2017-06-20 ENCOUNTER — Encounter
Admission: EM | Disposition: A | Discharge status: Home / Self Care | Payer: Self-pay | Source: Home / Self Care | Attending: Emergency Medicine

## 2017-06-20 ENCOUNTER — Encounter: Payer: Self-pay | Admitting: Anesthesiology

## 2017-06-20 ENCOUNTER — Inpatient Hospital Stay: Payer: Medicaid Other | Admitting: Certified Registered Nurse Anesthetist

## 2017-06-20 ENCOUNTER — Inpatient Hospital Stay: Payer: Medicaid Other

## 2017-06-20 DIAGNOSIS — S82899A Other fracture of unspecified lower leg, initial encounter for closed fracture: Secondary | ICD-10-CM

## 2017-06-20 HISTORY — PX: ORIF ANKLE FRACTURE: SHX5408

## 2017-06-20 HISTORY — DX: Other fracture of unspecified lower leg, initial encounter for closed fracture: S82.899A

## 2017-06-20 LAB — CBC
HEMATOCRIT: 37.7 % (ref 35.0–47.0)
HEMOGLOBIN: 12.9 g/dL (ref 12.0–16.0)
MCH: 31.3 pg (ref 26.0–34.0)
MCHC: 34.2 g/dL (ref 32.0–36.0)
MCV: 91.5 fL (ref 80.0–100.0)
Platelets: 178 10*3/uL (ref 150–440)
RBC: 4.12 MIL/uL (ref 3.80–5.20)
RDW: 13.1 % (ref 11.5–14.5)
WBC: 9.9 10*3/uL (ref 3.6–11.0)

## 2017-06-20 LAB — BASIC METABOLIC PANEL
Anion gap: 7 (ref 5–15)
BUN: 16 mg/dL (ref 6–20)
CHLORIDE: 111 mmol/L (ref 101–111)
CO2: 25 mmol/L (ref 22–32)
CREATININE: 0.74 mg/dL (ref 0.44–1.00)
Calcium: 8.5 mg/dL — ABNORMAL LOW (ref 8.9–10.3)
GFR calc Af Amer: >60 mL/min (ref >60)
GFR calc non Af Amer: >60 mL/min (ref >60)
Glucose, Bld: 97 mg/dL (ref 65–99)
POTASSIUM: 3.6 mmol/L (ref 3.5–5.1)
Sodium: 143 mmol/L (ref 135–145)

## 2017-06-20 LAB — PREGNANCY, URINE: Preg Test, Ur: NEGATIVE

## 2017-06-20 LAB — SURGICAL PCR SCREEN
MRSA, PCR: NEGATIVE
Staphylococcus aureus: NEGATIVE

## 2017-06-20 SURGERY — OPEN REDUCTION INTERNAL FIXATION (ORIF) ANKLE FRACTURE
Anesthesia: General | Laterality: Right

## 2017-06-20 MED ORDER — PHENYLEPHRINE HCL 10 MG/ML IJ SOLN
INTRAMUSCULAR | Status: DC | PRN
  Administered 2017-06-20 (×2): 100 ug via INTRAVENOUS

## 2017-06-20 MED ORDER — VENLAFAXINE HCL ER 37.5 MG PO CP24
37.5000 mg | ORAL_CAPSULE | Freq: Two times a day (BID) | ORAL | Status: DC
  Administered 2017-06-21 – 2017-06-23 (×6): 37.5 mg via ORAL
  Filled 2017-06-20 (×7): qty 1

## 2017-06-20 MED ORDER — PHENOL 1.4 % MT LIQD
1.0000 | OROMUCOSAL | Status: DC | PRN
  Filled 2017-06-20: qty 177

## 2017-06-20 MED ORDER — HYDROMORPHONE HCL 1 MG/ML IJ SOLN
INTRAMUSCULAR | Status: AC
  Filled 2017-06-20: qty 1

## 2017-06-20 MED ORDER — METOPROLOL SUCCINATE ER 25 MG PO TB24
37.5000 mg | ORAL_TABLET | Freq: Every day | ORAL | Status: DC
  Administered 2017-06-21 – 2017-06-23 (×3): 37.5 mg via ORAL
  Filled 2017-06-20 (×4): qty 2

## 2017-06-20 MED ORDER — MEDROXYPROGESTERONE ACETATE 150 MG/ML IM SUSP: 150.0000 mg | INTRAMUSCULAR | Status: DC

## 2017-06-20 MED ORDER — NEOMYCIN-POLYMYXIN B GU 40-200000 IR SOLN
Status: DC | PRN
  Administered 2017-06-20: 2 mL

## 2017-06-20 MED ORDER — SUCCINYLCHOLINE CHLORIDE 20 MG/ML IJ SOLN
INTRAMUSCULAR | Status: AC
  Filled 2017-06-20: qty 1

## 2017-06-20 MED ORDER — SODIUM CHLORIDE 0.9 % IV SOLN
75.0000 mL/h | INTRAVENOUS | Status: DC
  Administered 2017-06-20 – 2017-06-21 (×2): 75 mL/h via INTRAVENOUS

## 2017-06-20 MED ORDER — MAGNESIUM CITRATE PO SOLN
1.0000 | Freq: Once | ORAL | Status: AC | PRN
  Administered 2017-06-23: 1 via ORAL
  Filled 2017-06-20: qty 296

## 2017-06-20 MED ORDER — DEXAMETHASONE SODIUM PHOSPHATE 10 MG/ML IJ SOLN
INTRAMUSCULAR | Status: AC
  Filled 2017-06-20: qty 1

## 2017-06-20 MED ORDER — POLYETHYLENE GLYCOL 3350 17 G PO PACK
17.0000 g | PACK | Freq: Two times a day (BID) | ORAL | Status: DC | PRN
  Administered 2017-06-22 – 2017-06-23 (×2): 17 g via ORAL
  Filled 2017-06-20 (×2): qty 1

## 2017-06-20 MED ORDER — PREGABALIN 75 MG PO CAPS
150.0000 mg | ORAL_CAPSULE | Freq: Two times a day (BID) | ORAL | Status: DC
  Administered 2017-06-20 – 2017-06-23 (×7): 150 mg via ORAL
  Filled 2017-06-20 (×7): qty 2

## 2017-06-20 MED ORDER — CLINDAMYCIN PHOSPHATE 600 MG/50ML IV SOLN
600.0000 mg | Freq: Four times a day (QID) | INTRAVENOUS | Status: AC
  Administered 2017-06-20 (×2): 600 mg via INTRAVENOUS
  Filled 2017-06-20 (×2): qty 50

## 2017-06-20 MED ORDER — SENNA 8.6 MG PO TABS
1.0000 | ORAL_TABLET | Freq: Two times a day (BID) | ORAL | Status: DC
  Administered 2017-06-20 – 2017-06-23 (×7): 8.6 mg via ORAL
  Filled 2017-06-20 (×7): qty 1

## 2017-06-20 MED ORDER — MIDAZOLAM HCL 2 MG/2ML IJ SOLN
INTRAMUSCULAR | Status: AC
  Filled 2017-06-20: qty 2

## 2017-06-20 MED ORDER — PHENYLEPHRINE HCL 10 MG/ML IJ SOLN
INTRAMUSCULAR | Status: AC
  Filled 2017-06-20: qty 1

## 2017-06-20 MED ORDER — ALUM & MAG HYDROXIDE-SIMETH 200-200-20 MG/5ML PO SUSP: 30.0000 mL | ORAL | Status: DC | PRN

## 2017-06-20 MED ORDER — DIPHENHYDRAMINE HCL 50 MG/ML IJ SOLN
INTRAMUSCULAR | Status: AC
  Filled 2017-06-20: qty 1

## 2017-06-20 MED ORDER — ACETAMINOPHEN 10 MG/ML IV SOLN
INTRAVENOUS | Status: DC | PRN
  Administered 2017-06-20: 1000 mg via INTRAVENOUS

## 2017-06-20 MED ORDER — METHOCARBAMOL 500 MG PO TABS
500.0000 mg | ORAL_TABLET | Freq: Four times a day (QID) | ORAL | Status: DC | PRN
  Administered 2017-06-20 – 2017-06-23 (×9): 500 mg via ORAL
  Filled 2017-06-20 (×10): qty 1

## 2017-06-20 MED ORDER — MENTHOL 3 MG MT LOZG
1.0000 | LOZENGE | OROMUCOSAL | Status: DC | PRN
  Filled 2017-06-20: qty 9

## 2017-06-20 MED ORDER — DEXAMETHASONE SODIUM PHOSPHATE 10 MG/ML IJ SOLN
INTRAMUSCULAR | Status: DC | PRN
  Administered 2017-06-20: 4 mg via INTRAVENOUS

## 2017-06-20 MED ORDER — ACETAMINOPHEN 325 MG PO TABS: 650.0000 mg | ORAL_TABLET | Freq: Four times a day (QID) | ORAL | Status: DC | PRN

## 2017-06-20 MED ORDER — VENLAFAXINE HCL ER 37.5 MG PO CP24
37.5000 mg | ORAL_CAPSULE | Freq: Two times a day (BID) | ORAL | Status: DC
  Filled 2017-06-20 (×2): qty 1

## 2017-06-20 MED ORDER — ACETAMINOPHEN 650 MG RE SUPP: 650.0000 mg | Freq: Four times a day (QID) | RECTAL | Status: DC | PRN

## 2017-06-20 MED ORDER — ASPIRIN-SALICYLAMIDE-CAFFEINE 650-195-33.3 MG PO PACK: 1.0000 | PACK | Freq: Three times a day (TID) | ORAL | Status: DC | PRN

## 2017-06-20 MED ORDER — FENTANYL CITRATE (PF) 100 MCG/2ML IJ SOLN
INTRAMUSCULAR | Status: AC
  Filled 2017-06-20: qty 2

## 2017-06-20 MED ORDER — FAMOTIDINE 20 MG PO TABS
20.0000 mg | ORAL_TABLET | Freq: Every day | ORAL | Status: DC
  Administered 2017-06-20 – 2017-06-23 (×4): 20 mg via ORAL
  Filled 2017-06-20 (×4): qty 1

## 2017-06-20 MED ORDER — FENTANYL CITRATE (PF) 100 MCG/2ML IJ SOLN
INTRAMUSCULAR | Status: DC | PRN
  Administered 2017-06-20: 25 ug via INTRAVENOUS
  Administered 2017-06-20 (×2): 50 ug via INTRAVENOUS
  Administered 2017-06-20: 25 ug via INTRAVENOUS
  Administered 2017-06-20: 50 ug via INTRAVENOUS

## 2017-06-20 MED ORDER — LIDOCAINE HCL (PF) 2 % IJ SOLN
INTRAMUSCULAR | Status: AC
  Filled 2017-06-20: qty 10

## 2017-06-20 MED ORDER — BISACODYL 10 MG RE SUPP: 10.0000 mg | Freq: Every day | RECTAL | Status: DC | PRN

## 2017-06-20 MED ORDER — HYDROMORPHONE HCL 1 MG/ML IJ SOLN
INTRAMUSCULAR | Status: DC | PRN
  Administered 2017-06-20: 0.5 mg via INTRAVENOUS

## 2017-06-20 MED ORDER — OXYCODONE HCL 5 MG PO TABS
5.0000 mg | ORAL_TABLET | ORAL | Status: DC | PRN
  Administered 2017-06-20 (×2): 5 mg via ORAL
  Administered 2017-06-20: 10 mg via ORAL
  Administered 2017-06-21: 5 mg via ORAL
  Administered 2017-06-21 (×2): 10 mg via ORAL
  Administered 2017-06-21 (×2): 5 mg via ORAL
  Administered 2017-06-21 (×2): 10 mg via ORAL
  Administered 2017-06-21: 5 mg via ORAL
  Administered 2017-06-22 (×3): 10 mg via ORAL
  Administered 2017-06-22: 5 mg via ORAL
  Administered 2017-06-22 – 2017-06-23 (×7): 10 mg via ORAL
  Filled 2017-06-20 (×2): qty 2
  Filled 2017-06-20: qty 1
  Filled 2017-06-20 (×3): qty 2
  Filled 2017-06-20 (×2): qty 1
  Filled 2017-06-20 (×4): qty 2
  Filled 2017-06-20: qty 1
  Filled 2017-06-20 (×3): qty 2
  Filled 2017-06-20 (×2): qty 1
  Filled 2017-06-20 (×2): qty 2
  Filled 2017-06-20: qty 1
  Filled 2017-06-20 (×2): qty 2

## 2017-06-20 MED ORDER — FENTANYL CITRATE (PF) 100 MCG/2ML IJ SOLN
25.0000 ug | INTRAMUSCULAR | Status: DC | PRN
  Administered 2017-06-20: 50 ug via INTRAVENOUS

## 2017-06-20 MED ORDER — LACTATED RINGERS IV SOLN
INTRAVENOUS | Status: DC | PRN
  Administered 2017-06-20: 11:00:00 via INTRAVENOUS

## 2017-06-20 MED ORDER — DIPHENHYDRAMINE HCL 50 MG/ML IJ SOLN: 25.0000 mg | Freq: Four times a day (QID) | INTRAMUSCULAR | Status: DC | PRN

## 2017-06-20 MED ORDER — ENOXAPARIN SODIUM 40 MG/0.4ML ~~LOC~~ SOLN
40.0000 mg | SUBCUTANEOUS | Status: DC
  Administered 2017-06-21 – 2017-06-23 (×3): 40 mg via SUBCUTANEOUS
  Filled 2017-06-20 (×3): qty 0.4

## 2017-06-20 MED ORDER — ONDANSETRON HCL 4 MG/2ML IJ SOLN: 4.0000 mg | Freq: Four times a day (QID) | INTRAMUSCULAR | Status: DC | PRN

## 2017-06-20 MED ORDER — DOCUSATE SODIUM 100 MG PO CAPS
100.0000 mg | ORAL_CAPSULE | Freq: Two times a day (BID) | ORAL | Status: DC
  Administered 2017-06-20 – 2017-06-23 (×7): 100 mg via ORAL
  Filled 2017-06-20 (×7): qty 1

## 2017-06-20 MED ORDER — ONDANSETRON HCL 4 MG PO TABS: 4.0000 mg | ORAL_TABLET | Freq: Four times a day (QID) | ORAL | Status: DC | PRN

## 2017-06-20 MED ORDER — METHOCARBAMOL 1000 MG/10ML IJ SOLN
500.0000 mg | Freq: Four times a day (QID) | INTRAVENOUS | Status: DC | PRN
  Filled 2017-06-20: qty 5

## 2017-06-20 MED ORDER — PROPOFOL 10 MG/ML IV BOLUS
INTRAVENOUS | Status: AC
  Filled 2017-06-20: qty 20

## 2017-06-20 MED ORDER — ASPIRIN-ACETAMINOPHEN-CAFFEINE 250-250-65 MG PO TABS
1.0000 | ORAL_TABLET | Freq: Three times a day (TID) | ORAL | Status: DC | PRN
  Filled 2017-06-20: qty 1

## 2017-06-20 MED ORDER — MIDAZOLAM HCL 2 MG/2ML IJ SOLN
INTRAMUSCULAR | Status: DC | PRN
  Administered 2017-06-20: 2 mg via INTRAVENOUS

## 2017-06-20 MED ORDER — ACETAMINOPHEN 10 MG/ML IV SOLN
INTRAVENOUS | Status: AC
  Filled 2017-06-20: qty 100

## 2017-06-20 MED ORDER — ROCURONIUM BROMIDE 50 MG/5ML IV SOLN
INTRAVENOUS | Status: AC
  Filled 2017-06-20: qty 1

## 2017-06-20 MED ORDER — ALBUTEROL SULFATE HFA 108 (90 BASE) MCG/ACT IN AERS: 2.0000 | INHALATION_SPRAY | Freq: Four times a day (QID) | RESPIRATORY_TRACT | Status: DC | PRN

## 2017-06-20 MED ORDER — PROPOFOL 10 MG/ML IV BOLUS
INTRAVENOUS | Status: DC | PRN
  Administered 2017-06-20: 160 mg via INTRAVENOUS

## 2017-06-20 MED ORDER — LIDOCAINE HCL (CARDIAC) 20 MG/ML IV SOLN
INTRAVENOUS | Status: DC | PRN
  Administered 2017-06-20: 80 mg via INTRAVENOUS

## 2017-06-20 MED ORDER — ALBUTEROL SULFATE (2.5 MG/3ML) 0.083% IN NEBU: 2.5000 mg | INHALATION_SOLUTION | Freq: Four times a day (QID) | RESPIRATORY_TRACT | Status: DC | PRN

## 2017-06-20 MED ORDER — DIPHENHYDRAMINE HCL 50 MG/ML IJ SOLN
INTRAMUSCULAR | Status: DC | PRN
  Administered 2017-06-20: 12.5 mg via INTRAVENOUS

## 2017-06-20 SURGICAL SUPPLY — 53 items
ANCHOR SUPER #2 ORTHOCORD (MISCELLANEOUS) ×3 IMPLANT
BANDAGE ELASTIC 4 LF NS (GAUZE/BANDAGES/DRESSINGS) ×6 IMPLANT
BIT DRILL 2.5X110 QC LCP DISP (BIT) ×3 IMPLANT
BIT DRILL CANN 2.7X625 NONSTRL (BIT) ×3 IMPLANT
BLADE SURG 15 STRL LF DISP TIS (BLADE) ×1 IMPLANT
BLADE SURG 15 STRL SS (BLADE) ×2
BNDG COHESIVE 4X5 TAN STRL (GAUZE/BANDAGES/DRESSINGS) ×3 IMPLANT
BNDG ESMARK 6X12 TAN STRL LF (GAUZE/BANDAGES/DRESSINGS) ×3 IMPLANT
CLOSURE WOUND 1/2 X4 (GAUZE/BANDAGES/DRESSINGS)
CUFF TOURN 24 STER (MISCELLANEOUS) ×3 IMPLANT
CUFF TOURN 30 STER DUAL PORT (MISCELLANEOUS) IMPLANT
DRAPE FLUOR MINI C-ARM 54X84 (DRAPES) ×3 IMPLANT
DRAPE INCISE IOBAN 66X45 STRL (DRAPES) ×3 IMPLANT
DRAPE U-SHAPE 47X51 STRL (DRAPES) ×3 IMPLANT
DURAPREP 26ML APPLICATOR (WOUND CARE) ×6 IMPLANT
ELECT REM PT RETURN 9FT ADLT (ELECTROSURGICAL) ×3
ELECTRODE REM PT RTRN 9FT ADLT (ELECTROSURGICAL) ×1 IMPLANT
GAUZE PETRO XEROFOAM 1X8 (MISCELLANEOUS) ×6 IMPLANT
GAUZE SPONGE 4X4 12PLY STRL (GAUZE/BANDAGES/DRESSINGS) ×6 IMPLANT
GLOVE BIOGEL PI IND STRL 9 (GLOVE) ×1 IMPLANT
GLOVE BIOGEL PI INDICATOR 9 (GLOVE) ×2
GLOVE SURG 9.0 ORTHO LTXF (GLOVE) IMPLANT
GOWN STRL REUS TWL 2XL XL LVL4 (GOWN DISPOSABLE) ×3 IMPLANT
GOWN STRL REUS W/ TWL LRG LVL3 (GOWN DISPOSABLE) ×1 IMPLANT
GOWN STRL REUS W/TWL LRG LVL3 (GOWN DISPOSABLE) ×2
GUIDEWIRE THREADED 150MM (WIRE) ×6 IMPLANT
KIT RM TURNOVER STRD PROC AR (KITS) ×3 IMPLANT
LABEL OR SOLS (LABEL) IMPLANT
NS IRRIG 1000ML POUR BTL (IV SOLUTION) ×3 IMPLANT
PACK EXTREMITY ARMC (MISCELLANEOUS) ×3 IMPLANT
PAD ABD DERMACEA PRESS 5X9 (GAUZE/BANDAGES/DRESSINGS) ×18 IMPLANT
PAD CAST CTTN 4X4 STRL (SOFTGOODS) ×3 IMPLANT
PADDING CAST 4IN STRL (MISCELLANEOUS) ×8
PADDING CAST BLEND 4X4 STRL (MISCELLANEOUS) ×4 IMPLANT
PADDING CAST COTTON 4X4 STRL (SOFTGOODS) ×6
PLATE LCP 3.5 1/3 TUB 7HX81 (Plate) ×3 IMPLANT
SCREW CANN L THRD/32 4.0 (Orthopedic Implant) ×3 IMPLANT
SCREW CANN L THRD/40 4.0 (Screw) ×3 IMPLANT
SCREW CORTEX 3.5 10MM (Screw) ×10 IMPLANT
SCREW CORTEX 3.5 12MM (Screw) ×2 IMPLANT
SCREW LOCK CORT ST 3.5X10 (Screw) ×5 IMPLANT
SCREW LOCK CORT ST 3.5X12 (Screw) ×1 IMPLANT
SCREW SHORT THREAD 4.0X40 (Screw) ×3 IMPLANT
SPLINT CAST 1 STEP 4X30 (MISCELLANEOUS) ×6 IMPLANT
SPONGE LAP 18X18 5 PK (GAUZE/BANDAGES/DRESSINGS) ×3 IMPLANT
STAPLER SKIN PROX 35W (STAPLE) ×3 IMPLANT
STOCKINETTE STRL 4IN 9604848 (GAUZE/BANDAGES/DRESSINGS) ×3 IMPLANT
STOCKINETTE STRL 6IN 960660 (GAUZE/BANDAGES/DRESSINGS) IMPLANT
STRIP CLOSURE SKIN 1/2X4 (GAUZE/BANDAGES/DRESSINGS) IMPLANT
SUT VIC AB 2-0 SH 27 (SUTURE) ×2
SUT VIC AB 2-0 SH 27XBRD (SUTURE) ×1 IMPLANT
SYR 30ML LL (SYRINGE) ×3 IMPLANT
TAPE MICROPORE 2IN (TAPE) ×3 IMPLANT

## 2017-06-20 NOTE — Anesthesia Post-op Follow-up Note (Signed)
Anesthesia QCDR form completed.        

## 2017-06-20 NOTE — Anesthesia Procedure Notes (Signed)
Procedure Name: LMA Insertion Performed by: Rolande Moe Pre-anesthesia Checklist: Patient identified, Patient being monitored, Timeout performed, Emergency Drugs available and Suction available Patient Re-evaluated:Patient Re-evaluated prior to induction Oxygen Delivery Method: Circle system utilized Preoxygenation: Pre-oxygenation with 100% oxygen Induction Type: IV induction LMA: LMA inserted LMA Size: 3.5 Tube type: Oral Number of attempts: 1 Placement Confirmation: positive ETCO2 and breath sounds checked- equal and bilateral Tube secured with: Tape Dental Injury: Teeth and Oropharynx as per pre-operative assessment        

## 2017-06-20 NOTE — Transfer of Care (Signed)
Immediate Anesthesia Transfer of Care Note  Patient: Ellen Jackson  Procedure(s) Performed: OPEN REDUCTION INTERNAL FIXATION (ORIF) ANKLE FRACTURE (Right )  Patient Location: PACU  Anesthesia Type:General  Level of Consciousness: sedated  Airway & Oxygen Therapy: Patient Spontanous Breathing and Patient connected to face mask oxygen  Post-op Assessment: Report given to RN and Post -op Vital signs reviewed and stable  Post vital signs: Reviewed and stable  Last Vitals:  Vitals:   06/20/17 0749 06/20/17 1146  BP: 111/76 115/69  Pulse: 79 87  Resp: 18   Temp: 36.8 C   SpO2: 97% 95%    Last Pain:  Vitals:   06/20/17 0749  TempSrc: Oral  PainSc: 5          Complications: No apparent anesthesia complications

## 2017-06-20 NOTE — Anesthesia Preprocedure Evaluation (Signed)
Anesthesia Evaluation  Patient identified by MRN, date of birth, ID band Patient awake    Reviewed: Allergy & Precautions, NPO status , Patient's Chart, lab work & pertinent test results, reviewed documented beta blocker date and time   History of Anesthesia Complications Negative for: history of anesthetic complications  Airway Mallampati: I  TM Distance: >3 FB     Dental  (+) Chipped, Missing, Poor Dentition, Dental Advidsory Given   Pulmonary neg shortness of breath, neg COPD, neg recent URI, Current Smoker,           Cardiovascular Exercise Tolerance: Good (-) hypertension(-) angina(-) CAD, (-) Past MI, (-) Cardiac Stents and (-) CABG + dysrhythmias Supra Ventricular Tachycardia (-) Valvular Problems/Murmurs     Neuro/Psych  Headaches, neg Seizures PSYCHIATRIC DISORDERS (Depression, anxiety, and fibromyalgia)    GI/Hepatic Neg liver ROS, GERD  Controlled,  Endo/Other  negative endocrine ROS  Renal/GU negative Renal ROS  negative genitourinary   Musculoskeletal  (+) Fibromyalgia -  Abdominal   Peds  Hematology negative hematology ROS (+)   Anesthesia Other Findings Hx of SVT. Allergic to zofran, phenergan.  Reproductive/Obstetrics                             Anesthesia Physical  Anesthesia Plan  ASA: III  Anesthesia Plan: General   Post-op Pain Management:    Induction: Intravenous  PONV Risk Score and Plan: 1 and Ondansetron and Dexamethasone  Airway Management Planned: LMA  Additional Equipment:   Intra-op Plan:   Post-operative Plan: Extubation in OR  Informed Consent: I have reviewed the patients History and Physical, chart, labs and discussed the procedure including the risks, benefits and alternatives for the proposed anesthesia with the patient or authorized representative who has indicated his/her understanding and acceptance.   Dental Advisory Given  Plan  Discussed with: CRNA  Anesthesia Plan Comments:         Anesthesia Quick Evaluation

## 2017-06-20 NOTE — Progress Notes (Signed)
Subjective:  POST-OP CHECK:  S/p right ankle ORIF, lateral ankle ligaments.  Patient reports pain as mild to moderate.  Family at the bedside.  Patient resting comfortably.    Objective:   VITALS:   Vitals:   06/20/17 1217 06/20/17 1227 06/20/17 1230 06/20/17 1247  BP: 136/86  126/89 126/75  Pulse: (!) 120 (!) 111 (!) 108 (!) 108  Resp: 14 14 12    Temp:  97.6 F (36.4 C) 98.5 F (36.9 C) 98.6 F (37 C)  TempSrc:    Axillary  SpO2: 100% 96% 96% 100%  Weight:      Height:        PHYSICAL EXAM: RIGHT ANKLE:   Patient can flex and extend her toes, has intact sensation to light touch in her toes and her toes are well perfused.   Neurovascular intact Sensation intact distally  LABS  Results for orders placed or performed during the hospital encounter of 06/19/17 (from the past 24 hour(s))  Surgical PCR screen     Status: None   Collection Time: 06/19/17 10:47 PM  Result Value Ref Range   MRSA, PCR NEGATIVE NEGATIVE   Staphylococcus aureus NEGATIVE NEGATIVE  Protime-INR     Status: None   Collection Time: 06/19/17 11:24 PM  Result Value Ref Range   Prothrombin Time 12.8 11.4 - 15.2 seconds   INR 0.97   CBC WITH DIFFERENTIAL     Status: None   Collection Time: 06/19/17 11:24 PM  Result Value Ref Range   WBC 8.8 3.6 - 11.0 K/uL   RBC 4.06 3.80 - 5.20 MIL/uL   Hemoglobin 12.7 12.0 - 16.0 g/dL   HCT 36.5 35.0 - 47.0 %   MCV 89.9 80.0 - 100.0 fL   MCH 31.2 26.0 - 34.0 pg   MCHC 34.7 32.0 - 36.0 g/dL   RDW 13.2 11.5 - 14.5 %   Platelets 184 150 - 440 K/uL   Neutrophils Relative % 60 %   Neutro Abs 5.4 1.4 - 6.5 K/uL   Lymphocytes Relative 27 %   Lymphs Abs 2.3 1.0 - 3.6 K/uL   Monocytes Relative 7 %   Monocytes Absolute 0.6 0.2 - 0.9 K/uL   Eosinophils Relative 5 %   Eosinophils Absolute 0.4 0 - 0.7 K/uL   Basophils Relative 1 %   Basophils Absolute 0.0 0 - 0.1 K/uL  APTT     Status: None   Collection Time: 06/19/17 11:24 PM  Result Value Ref Range   aPTT 25 24  - 36 seconds  CBC     Status: None   Collection Time: 06/20/17  3:15 AM  Result Value Ref Range   WBC 9.9 3.6 - 11.0 K/uL   RBC 4.12 3.80 - 5.20 MIL/uL   Hemoglobin 12.9 12.0 - 16.0 g/dL   HCT 37.7 35.0 - 47.0 %   MCV 91.5 80.0 - 100.0 fL   MCH 31.3 26.0 - 34.0 pg   MCHC 34.2 32.0 - 36.0 g/dL   RDW 13.1 11.5 - 14.5 %   Platelets 178 150 - 440 K/uL  Basic metabolic panel     Status: Abnormal   Collection Time: 06/20/17  3:15 AM  Result Value Ref Range   Sodium 143 135 - 145 mmol/L   Potassium 3.6 3.5 - 5.1 mmol/L   Chloride 111 101 - 111 mmol/L   CO2 25 22 - 32 mmol/L   Glucose, Bld 97 65 - 99 mg/dL   BUN 16 6 - 20 mg/dL  Creatinine, Ser 0.74 0.44 - 1.00 mg/dL   Calcium 8.5 (L) 8.9 - 10.3 mg/dL   GFR calc non Af Amer >60 >60 mL/min   GFR calc Af Amer >60 >60 mL/min   Anion gap 7 5 - 15  Pregnancy, urine     Status: None   Collection Time: 06/20/17  7:45 AM  Result Value Ref Range   Preg Test, Ur NEGATIVE NEGATIVE    Dg Ankle Complete Right  Result Date: 06/19/2017 CLINICAL DATA:  Post reduction EXAM: RIGHT ANKLE - COMPLETE 3+ VIEW COMPARISON:  06/19/2017 FINDINGS: Interval placement of cast material which obscures bone detail. Slightly comminuted fracture involving the distal shaft of the fibula at the junction of the middle and distal thirds with residual 1/2 shaft diameter of lateral and posterior displacement. Minimally displaced medial malleolar fracture. Re- demonstrated displaced posterior malleolar fracture. Reduction of posteriorly dislocated talus. IMPRESSION: 1. Placement of cast material 2. Slight decreased displacement of distal fibular fracture 3. Reduction of ankle dislocation. Re- demonstrated fractures of the medial and posterior malleolus Electronically Signed   By: Donavan Foil M.D.   On: 06/19/2017 22:07   Dg Ankle Complete Right  Result Date: 06/19/2017 CLINICAL DATA:  Stepped in hole today. Severe ankle pain and deformity. Initial encounter. EXAM: RIGHT  ANKLE - COMPLETE 3+ VIEW COMPARISON:  08/16/2015 FINDINGS: Displaced fractures are seen through the medial and posterior malleoli of the distal tibia. There is also a displaced oblique fracture of the distal fibular diaphysis. Posterior dislocation of the talus is also seen. IMPRESSION: Ankle fracture- dislocation, as described above . Electronically Signed   By: Earle Gell M.D.   On: 06/19/2017 19:35    Assessment/Plan: Day of Surgery   Active Problems:   Closed right ankle fracture  Patient stable post-op.   Post-op xrays in PACU demonstrate fractures are well reduced.  Continue elevation and ice for her right ankle.  Patient is NWB on her right ankle and will need crutches or a walker.       Thornton Park , MD 06/20/2017, 1:07 PM

## 2017-06-20 NOTE — Op Note (Addendum)
06/19/2017 - 06/20/2017  12:11 PM  PATIENT:  Ellen Jackson    PRE-OPERATIVE DIAGNOSIS:  right ankle trimalleolar fracture - dislocation  POST-OPERATIVE DIAGNOSIS:  Same  PROCEDURE:  OPEN REDUCTION INTERNAL FIXATION (ORIF) TRIMALLEOLAR RIGHT ANKLE FRACTURE INCLUDING FIXATION OF THE POSTERIOR MALLEOLUS AND LATERAL LIGAMENT REPAIR  SURGEON:  Thornton Park, MD  ANESTHESIA:   General  PREOPERATIVE INDICATIONS:  IYANNA Jackson is a  26 y.o. female with a diagnosis of right ankle fracture who failed conservative measures and elected for surgical management.    I discussed the risks and benefits of surgery. The risks include but are not limited to infection, bleeding requiring blood transfusion, nerve or blood vessel injury, joint stiffness or loss of motion, persistent pain, weakness or instability, malunion, nonunion and hardware failure and the need for further surgery. Medical risks include but are not limited to DVT and pulmonary embolism, myocardial infarction, stroke, pneumonia, respiratory failure and death. Patient understood these risks and wished to proceed.   OPERATIVE IMPLANTS: Synthes 1/3 tubular 7 hole plate, Synthes 4.0 cannulated screws x 3 (42m long thread, 40 mm short thread, 32 mm long thread), Depuy Mitek Super Quickanchor Plus DS x 1 for lateral ligament repair  OPERATIVE FINDINGS: Displaced trimalleolar ankle fracture, torn anterior talofibular ligament and calcaneofibular ligaments  OPERATIVE PROCEDURE:   Patient was met in the preoperative area. The right leg was signed with my initials and the word yes according the hospital's correct site of surgery protocol. The patient was brought to the operating room where she underwent general anesthesia with an LMA. The patient was placed supine on the operative table. A bump was placed under the right hip. A tourniquet was applied to the right thigh.  The well leg was adequately padded.  The lower extremity was prepped and draped  in a sterile fashion. A timeout was performed to verify the patient's name, date of birth, medical record number, correct site of surgery and correct procedure to be performed. It was also used to verify the patient received antibiotics, and that all appropriate instruments, implants and radiographic studies were available in the room. Once all in attendance were in agreement, the case began.  The right lower extremity was exsanguinated with an Esmarch. The tourniquet was inflated to 275 mmHg. This was applied for a total of 130 minutes. A lateral incision was made over the fibula after identifying the level of the fracture on fluoroscan. The subcutaneous tissues were dissected with the Metzenbaum scissor and forceps. Care was taken to avoid injury to the superficial peroneal nerve. The superificial peroneal nerve was not encountered during the case.  The lateral malleolus fracture was identified and irrigated and fracture hematoma was removed. Soft tissue was removed from the fracture site using a periosteal elevator. A fracture reduction clamp was then used to reduce the fracture to an anatomic position.     A 7 hole, 1/3 tubular plate was then placed along the lateral fibula. Bicortical screws were placed proximal and distal to the fracture. The fracture reduction and hardware placement were confirmed on AP and lateral fluoroscan imaging.  Once the lateral malleolus was plated, the attention was turned to the medial ankle. A small vertical incision was made over the tip of the medial malleolus.  Soft tissue was dissected with some with the Metzenbaum scissor and pickup. The fracture of the medial malleolus was identified.  2 threaded K wires for the 4.0 cannulated screws were then advanced through the tip of the medial  malleolus across the fracture site and into the distal tibia. The position of the K wires was evaluated on AP and lateral FluoroScan images. The length of the wires were measured with a depth  gauge and were determined to be 40 and  65m in length. The wires were then overdrilled with a cannulated drill for the 4.0 cannulated screws. The long threaded 4.0 cannulated screws were then advanced into position by hand, compressing the medial malleolus fracture.    The posterior malleolus was then examined under fluoroscopy. It was felt to be unstable and involve greater than 20% of the articular surface. The decision was made made to fix the posterior malleolus.  A small stab incision was made over the anterior ankle.  The soft tissues over the anterior ankle were bluntly dissected using a hemostat allowing a soft tissue protector to be placed on the anterior cortex of the distal tibia. A K wire was advanced bicortically in an AP direction.   The position of the K wire and reduction of the posterior malleolus was confirmed on an AP and lateral fluoroscan imaging.  The k wire was then overdrilled and a 40 mm short threaded 4.0 cannulated screw was advanced into position by hand.  Final fluoroscan images of the ankle fixation construct were taken.    A stress test of the right ankle was then performed under fluoroscopy.  This test did not reveal any syndesmotic injury or opening of the medial clear space.  There was no excessive laxity to anterior drawer testing of the ankle.  However, patient had excessive inversion of the right ankle.  It was felt the patient had torn the lateral ligaments causing this instability.  An incision was made over the distal fibula.  The soft tissues were dissected using Metzenbaum scissor and forceps. The distal fibula was devoid of the origin of the anterior talofibular and calcaneofibular ligaments.  These torn ligaments were identified.   A Mitec Super QuickAnchor plus DS as placed into the anterior distal fibula after a drill hole was created.  The 4 sutures from this anchor were then used to repair the anterior talofibular ligament and the calcaneofibular ligament to the  distal fibula.  After repair of the ankle demonstrated stability and no longer excessively inverted.  The anterior, medial and lateral incisions were then copiously irrigated. The subcutaneous tissue was closed with 2-0 Vicryl and the skin approximated staples. A dry sterile dressing was applied along with an AO splint. The patient's ankle was positioned in neutral. The pateint was then awoken from anesthesia, transferred to hospital bed and brought to the PACU in stable condition. I was scrubbed and present the entire case and all sharp and instrument counts were correct at conclusion the case. I spoke to the patient's family in the post-op consultation room to let them know the case was performed without complication and the patient was stable in recovery room.    KTimoteo Gaul MD

## 2017-06-20 NOTE — Progress Notes (Addendum)
Per patient. She gets hives from Zofran. She is not having any nausea at this time. Spoke to [place] in pharmacy who will call Dr. Mack Guise

## 2017-06-20 NOTE — Anesthesia Postprocedure Evaluation (Signed)
Anesthesia Post Note  Patient: DAJE STARK  Procedure(s) Performed: OPEN REDUCTION INTERNAL FIXATION (ORIF) TRIMALLEOLAR ANKLE FRACTURE LATERAL LIGAMENT RECONSTRUCTION (Right )  Patient location during evaluation: PACU Anesthesia Type: General Level of consciousness: awake and alert Pain management: pain level controlled Vital Signs Assessment: post-procedure vital signs reviewed and stable Respiratory status: spontaneous breathing, nonlabored ventilation, respiratory function stable and patient connected to nasal cannula oxygen Cardiovascular status: blood pressure returned to baseline and stable Postop Assessment: no apparent nausea or vomiting Anesthetic complications: no     Last Vitals:  Vitals:   06/20/17 1300 06/20/17 1401  BP: 122/78 123/83  Pulse: 92 95  Resp:    Temp:    SpO2: 92% 100%    Last Pain:  Vitals:   06/20/17 1300  TempSrc:   PainSc: Asleep                 Martha Clan

## 2017-06-20 NOTE — Progress Notes (Signed)
Subjective:  Patient seen in pre-op area.  Patient reports right ankle pain as mild to moderate.    Objective:   VITALS:   Vitals:   06/19/17 2226 06/19/17 2243 06/19/17 2246 06/20/17 0749  BP: 133/83  120/67 111/76  Pulse: 87  80 79  Resp: 18  18 18   Temp:   98.7 F (37.1 C) 98.2 F (36.8 C)  TempSrc:   Oral Oral  SpO2: 97%  99% 97%  Weight:  85.7 kg (189 lb)    Height:  5\' 7"  (1.702 m)      PHYSICAL EXAM: Right lower extremity: Neurovascular intact Sensation intact distally Intact pulses distally Compartment soft  LABS  Results for orders placed or performed during the hospital encounter of 06/19/17 (from the past 24 hour(s))  Surgical PCR screen     Status: None   Collection Time: 06/19/17 10:47 PM  Result Value Ref Range   MRSA, PCR NEGATIVE NEGATIVE   Staphylococcus aureus NEGATIVE NEGATIVE  Protime-INR     Status: None   Collection Time: 06/19/17 11:24 PM  Result Value Ref Range   Prothrombin Time 12.8 11.4 - 15.2 seconds   INR 0.97   CBC WITH DIFFERENTIAL     Status: None   Collection Time: 06/19/17 11:24 PM  Result Value Ref Range   WBC 8.8 3.6 - 11.0 K/uL   RBC 4.06 3.80 - 5.20 MIL/uL   Hemoglobin 12.7 12.0 - 16.0 g/dL   HCT 36.5 35.0 - 47.0 %   MCV 89.9 80.0 - 100.0 fL   MCH 31.2 26.0 - 34.0 pg   MCHC 34.7 32.0 - 36.0 g/dL   RDW 13.2 11.5 - 14.5 %   Platelets 184 150 - 440 K/uL   Neutrophils Relative % 60 %   Neutro Abs 5.4 1.4 - 6.5 K/uL   Lymphocytes Relative 27 %   Lymphs Abs 2.3 1.0 - 3.6 K/uL   Monocytes Relative 7 %   Monocytes Absolute 0.6 0.2 - 0.9 K/uL   Eosinophils Relative 5 %   Eosinophils Absolute 0.4 0 - 0.7 K/uL   Basophils Relative 1 %   Basophils Absolute 0.0 0 - 0.1 K/uL  APTT     Status: None   Collection Time: 06/19/17 11:24 PM  Result Value Ref Range   aPTT 25 24 - 36 seconds  CBC     Status: None   Collection Time: 06/20/17  3:15 AM  Result Value Ref Range   WBC 9.9 3.6 - 11.0 K/uL   RBC 4.12 3.80 - 5.20 MIL/uL    Hemoglobin 12.9 12.0 - 16.0 g/dL   HCT 37.7 35.0 - 47.0 %   MCV 91.5 80.0 - 100.0 fL   MCH 31.3 26.0 - 34.0 pg   MCHC 34.2 32.0 - 36.0 g/dL   RDW 13.1 11.5 - 14.5 %   Platelets 178 150 - 440 K/uL  Basic metabolic panel     Status: Abnormal   Collection Time: 06/20/17  3:15 AM  Result Value Ref Range   Sodium 143 135 - 145 mmol/L   Potassium 3.6 3.5 - 5.1 mmol/L   Chloride 111 101 - 111 mmol/L   CO2 25 22 - 32 mmol/L   Glucose, Bld 97 65 - 99 mg/dL   BUN 16 6 - 20 mg/dL   Creatinine, Ser 0.74 0.44 - 1.00 mg/dL   Calcium 8.5 (L) 8.9 - 10.3 mg/dL   GFR calc non Af Amer >60 >60 mL/min   GFR calc Af  Amer >60 >60 mL/min   Anion gap 7 5 - 15  Pregnancy, urine     Status: None   Collection Time: 06/20/17  7:45 AM  Result Value Ref Range   Preg Test, Ur NEGATIVE NEGATIVE    Dg Ankle Complete Right  Result Date: 06/19/2017 CLINICAL DATA:  Post reduction EXAM: RIGHT ANKLE - COMPLETE 3+ VIEW COMPARISON:  06/19/2017 FINDINGS: Interval placement of cast material which obscures bone detail. Slightly comminuted fracture involving the distal shaft of the fibula at the junction of the middle and distal thirds with residual 1/2 shaft diameter of lateral and posterior displacement. Minimally displaced medial malleolar fracture. Re- demonstrated displaced posterior malleolar fracture. Reduction of posteriorly dislocated talus. IMPRESSION: 1. Placement of cast material 2. Slight decreased displacement of distal fibular fracture 3. Reduction of ankle dislocation. Re- demonstrated fractures of the medial and posterior malleolus Electronically Signed   By: Donavan Foil M.D.   On: 06/19/2017 22:07   Dg Ankle Complete Right  Result Date: 06/19/2017 CLINICAL DATA:  Stepped in hole today. Severe ankle pain and deformity. Initial encounter. EXAM: RIGHT ANKLE - COMPLETE 3+ VIEW COMPARISON:  08/16/2015 FINDINGS: Displaced fractures are seen through the medial and posterior malleoli of the distal tibia. There  is also a displaced oblique fracture of the distal fibular diaphysis. Posterior dislocation of the talus is also seen. IMPRESSION: Ankle fracture- dislocation, as described above . Electronically Signed   By: Earle Gell M.D.   On: 06/19/2017 19:35    Assessment/Plan: Day of Surgery   Active Problems:   Closed right ankle fracture  I reviewed the details of the operation with the patient today in the pre-op area.  Her friend was with her.  We discussed the post-op plan and the risks and benefits of surgery.   Consent was signed and witnessed by nurse.  She has been NPO.     Thornton Park , MD 06/20/2017, 8:48 AM

## 2017-06-20 NOTE — Progress Notes (Signed)
Patient states that she is allergic to Zofran. Since she has allergies to many antiemetics Dr. Mack Guise would like to have the option to give patient Zofran if she becomes sick and needs something for vomiting.  He asked this nurse to order Benedryl 25mg  q6 prn if needed for rash or hives from the Zofran.

## 2017-06-21 ENCOUNTER — Encounter: Payer: Self-pay | Admitting: Orthopedic Surgery

## 2017-06-21 LAB — BASIC METABOLIC PANEL
ANION GAP: 6 (ref 5–15)
BUN: 10 mg/dL (ref 6–20)
CO2: 25 mmol/L (ref 22–32)
Calcium: 8.1 mg/dL — ABNORMAL LOW (ref 8.9–10.3)
Chloride: 110 mmol/L (ref 101–111)
Creatinine, Ser: 0.67 mg/dL (ref 0.44–1.00)
GFR calc Af Amer: >60 mL/min (ref >60)
GFR calc non Af Amer: >60 mL/min (ref >60)
GLUCOSE: 116 mg/dL — AB (ref 65–99)
POTASSIUM: 3.9 mmol/L (ref 3.5–5.1)
Sodium: 141 mmol/L (ref 135–145)

## 2017-06-21 LAB — CBC
HEMATOCRIT: 35.6 % (ref 35.0–47.0)
Hemoglobin: 12.1 g/dL (ref 12.0–16.0)
MCH: 31.2 pg (ref 26.0–34.0)
MCHC: 34 g/dL (ref 32.0–36.0)
MCV: 91.8 fL (ref 80.0–100.0)
Platelets: 158 10*3/uL (ref 150–440)
RBC: 3.87 MIL/uL (ref 3.80–5.20)
RDW: 13 % (ref 11.5–14.5)
WBC: 9.7 10*3/uL (ref 3.6–11.0)

## 2017-06-21 LAB — HIV ANTIBODY (ROUTINE TESTING W REFLEX): HIV Screen 4th Generation wRfx: NONREACTIVE

## 2017-06-21 NOTE — Progress Notes (Signed)
Physical Therapy Treatment Patient Details Name: Ellen Jackson MRN: [id number] DOB: Jan 13, 1991 Today's Date: 06/21/2017    History of Present Illness Pt admitted for R ankle fracture and is now s/p ORIF on 10/28. History includes fibromyalgia, GERD, and spina bifida. Pt complains of fall at [facility] in the [place] while stepping in a hole.    PT Comments    Ellen Jackson made good progress this afternoon.  Provided min guard assist for transfers and to ambulate 9 ft, limited by R hip pain.  She will need to complete stair training on 10/30 before discharge.   Follow Up Recommendations  Home health PT;Supervision/Assistance - 24 hour     Equipment Recommendations  Rolling walker with 5" wheels;3in1 (PT)    Recommendations for Other Services       Precautions / Restrictions Precautions Precautions: Fall Restrictions Weight Bearing Restrictions: Yes RLE Weight Bearing: Non weight bearing    Mobility  Bed Mobility Overal bed mobility: Needs Assistance Bed Mobility: Supine to Sit     Supine to sit: Supervision     General bed mobility comments: Supervision for safety.  Pt requires increased time but no physical assist.  Transfers Overall transfer level: Needs assistance Equipment used: Rolling walker (2 wheeled) Transfers: Sit to/from Stand Sit to Stand: Min guard Stand pivot transfers: Min guard       General transfer comment: Pt performs with safe technique and no signs of instability.  Pt slow to stand.  She requires cues for controlled descent when sitting.   Ambulation/Gait Ambulation/Gait assistance: Min guard Ambulation Distance (Feet): 9 Feet Assistive device: Rolling walker (2 wheeled) Gait Pattern/deviations: Step-to pattern (hop to) Gait velocity: decreased Gait velocity interpretation: Below normal speed for age/gender General Gait Details: Close min guard for safety but pt steady.  She fatigues quickly after ambulating 9 ft and reports R hip pain, requesting to  sit.    Stairs            Wheelchair Mobility    Modified Rankin (Stroke Patients Only)       Balance Overall balance assessment: Needs assistance Sitting-balance support: No upper extremity supported;Feet supported Sitting balance-Leahy Scale: Good     Standing balance support: Bilateral upper extremity supported;During functional activity Standing balance-Leahy Scale: Poor Standing balance comment: Relies on UE support for static and dynamic activities                            Cognition Arousal/Alertness: Awake/alert Behavior During Therapy: WFL for tasks assessed/performed Overall Cognitive Status: Within Functional Limits for tasks assessed                                        Exercises General Exercises - Lower Extremity Quad Sets: Strengthening;Right;10 reps;Seated Straight Leg Raises: AAROM;Right;10 reps;Supine Other Exercises Other Exercises: Pt/family educated in use of [facility] for toileting needs and problem solving mobility from parking lot to home using wheelchair given distance due to parking lot being re-paved.  Other Exercises: Pt performing tolieting with supervision for hygiene. Needs assist and set up to transfer and line up (including moving hands down to toliet)    General Comments General comments (skin integrity, edema, etc.): At start of session pt was inquiring about why she is having PT so soon after surgery. Explained to the pt the role of PT for safety and strengthening.  Pt  verbalized understanding and was appreciative.      Pertinent Vitals/Pain Pain Assessment: Faces Pain Score: 10-Worst pain ever Faces Pain Scale: Hurts even more Pain Location: R ankle, RLE spasms Pain Descriptors / Indicators: Operative site guarding;Aching;Moaning;Grimacing;Spasm Pain Intervention(s): Limited activity within patient's tolerance;Monitored during session;Repositioned;Premedicated before session;Utilized relaxation  techniques    Home Living Family/patient expects to be discharged to:: Private residence Living Arrangements: Children;Parent (10yo (with autism) and 7yo children ) Available Help at Discharge: Family;Available 24 hours/day Type of Home: House (duplex) Home Access: Stairs to enter Entrance Stairs-Rails: None Home Layout: One level Home Equipment: Wheelchair - manual Additional Comments: Parent notes that the parking lot is currently being re-paved meaning they are unable to park close to the home which she/pt expressed great concern about.     Prior Function Level of Independence: Independent      Comments: Pt indep with ADL, IADL, no falls except for fall that led to this admission.   PT Goals (current goals can now be found in the care plan section) Acute Rehab PT Goals Patient Stated Goal: to go home PT Goal Formulation: With patient Time For Goal Achievement: 07/05/17 Potential to Achieve Goals: Good Additional Goals Additional Goal #1: Pt will be able to perform bed mobility/transfers with mod I and RW in order to improve functional independence Progress towards PT goals: Progressing toward goals    Frequency    BID      PT Plan Current plan remains appropriate    Co-evaluation              AM-PAC PT "6 Clicks" Daily Activity  Outcome Measure  Difficulty turning over in bed (including adjusting bedclothes, sheets and blankets)?: None Difficulty moving from lying on back to sitting on the side of the bed? : A Little Difficulty sitting down on and standing up from a chair with arms (e.g., wheelchair, bedside commode, etc,.)?: A Lot Help needed moving to and from a bed to chair (including a wheelchair)?: A Little Help needed walking in hospital room?: A Little Help needed climbing 3-5 steps with a railing? : A Lot 6 Click Score: 17    End of Session Equipment Utilized During Treatment: Gait belt Activity Tolerance: Patient tolerated treatment well;Patient  limited by pain Patient left: in chair;with call bell/phone within reach;with chair alarm set Nurse Communication: Mobility status PT Visit Diagnosis: Unsteadiness on feet (R26.81);Muscle weakness (generalized) (M62.81);Pain Pain - Right/Left: Right Pain - part of body: Ankle and joints of foot     Time: 1353-1414 PT Time Calculation (min) (ACUTE ONLY): 21 min  Charges:  $Therapeutic Exercise: 8-22 mins $Therapeutic Activity: 8-22 mins                    G Codes:  Functional Assessment Tool Used: AM-PAC 6 Clicks Basic Mobility Functional Limitation: Mobility: Walking and moving around Mobility: Walking and Moving Around Current Status (B3532): At least 40 percent but less than 60 percent impaired, limited or restricted Mobility: Walking and Moving Around Goal Status [phone]): At least 20 percent but less than 40 percent impaired, limited or restricted    Collie Siad PT, DPT 06/21/2017, 2:33 PM

## 2017-06-21 NOTE — Progress Notes (Signed)
PT Cancellation Note  Patient Details Name: Ellen Jackson MRN: [id number] DOB: March 23, 1991   Cancelled Treatment:    Reason Eval/Treat Not Completed: Other (comment). Pt currently receiving pain meds, wishes to hold evaluation until they have time to kick in. Will re-attempt.   Balbina Depace 06/21/2017, 9:05 AM  Greggory Stallion, PT, DPT [phone]

## 2017-06-21 NOTE — Progress Notes (Signed)
Subjective:  Postoperative day 1 status post right trimalleolar ankle fracture ORIF. Patient reports pain as marked.  Patient was able to get out of bed to chair with physical therapy earlier today but this caused increasing pain.  Objective:   VITALS:   Vitals:   06/20/17 1800 06/20/17 1952 06/21/17 0008 06/21/17 0908  BP: 126/76 129/83 118/69 115/72  Pulse: 86 79 92 66  Resp:   18   Temp: (!) 97.4 F (36.3 C) 98.4 F (36.9 C) 99.3 F (37.4 C) 98.7 F (37.1 C)  TempSrc: Oral Oral Oral Oral  SpO2: 98% 99% 95% 98%  Weight:      Height:        PHYSICAL EXAM: Right lower extremity: Neurovascular intact Sensation intact distally Incision: dressing C/D/I Compartment soft  LABS  Results for orders placed or performed during the hospital encounter of 06/19/17 (from the past 24 hour(s))  CBC     Status: None   Collection Time: 06/21/17  4:11 AM  Result Value Ref Range   WBC 9.7 3.6 - 11.0 K/uL   RBC 3.87 3.80 - 5.20 MIL/uL   Hemoglobin 12.1 12.0 - 16.0 g/dL   HCT 35.6 35.0 - 47.0 %   MCV 91.8 80.0 - 100.0 fL   MCH 31.2 26.0 - 34.0 pg   MCHC 34.0 32.0 - 36.0 g/dL   RDW 13.0 11.5 - 14.5 %   Platelets 158 150 - 440 K/uL  Basic metabolic panel     Status: Abnormal   Collection Time: 06/21/17  4:11 AM  Result Value Ref Range   Sodium 141 135 - 145 mmol/L   Potassium 3.9 3.5 - 5.1 mmol/L   Chloride 110 101 - 111 mmol/L   CO2 25 22 - 32 mmol/L   Glucose, Bld 116 (H) 65 - 99 mg/dL   BUN 10 6 - 20 mg/dL   Creatinine, Ser 0.67 0.44 - 1.00 mg/dL   Calcium 8.1 (L) 8.9 - 10.3 mg/dL   GFR calc non Af Amer >60 >60 mL/min   GFR calc Af Amer >60 >60 mL/min   Anion gap 6 5 - 15    Dg Ankle 2 Views Right  Result Date: 06/20/2017 CLINICAL DATA:  S/P right ankle ORIF EXAM: RIGHT ANKLE - 2 VIEW COMPARISON:  the previous day's study FINDINGS: Interval plate and screw fixation of the distal fibular shaft fracture. Orthopedic anchor in the lateral malleolus. Interval orthopedic screw  fixation of medial malleolar and posterior malleolar fractures. Fragments in near anatomic alignment. Medial and lateral skin staples. Overlying cast material obscures fine bone detail. IMPRESSION: 1. Interval internal fixation of  trimalleolar fracture. Electronically Signed   By: Lucrezia Europe M.D.   On: 06/20/2017 13:12   Dg Ankle Complete Right  Result Date: 06/19/2017 CLINICAL DATA:  Post reduction EXAM: RIGHT ANKLE - COMPLETE 3+ VIEW COMPARISON:  06/19/2017 FINDINGS: Interval placement of cast material which obscures bone detail. Slightly comminuted fracture involving the distal shaft of the fibula at the junction of the middle and distal thirds with residual 1/2 shaft diameter of lateral and posterior displacement. Minimally displaced medial malleolar fracture. Re- demonstrated displaced posterior malleolar fracture. Reduction of posteriorly dislocated talus. IMPRESSION: 1. Placement of cast material 2. Slight decreased displacement of distal fibular fracture 3. Reduction of ankle dislocation. Re- demonstrated fractures of the medial and posterior malleolus Electronically Signed   By: Donavan Foil M.D.   On: 06/19/2017 22:07   Dg Ankle Complete Right  Result Date: 06/19/2017 CLINICAL  DATA:  Stepped in hole today. Severe ankle pain and deformity. Initial encounter. EXAM: RIGHT ANKLE - COMPLETE 3+ VIEW COMPARISON:  08/16/2015 FINDINGS: Displaced fractures are seen through the medial and posterior malleoli of the distal tibia. There is also a displaced oblique fracture of the distal fibular diaphysis. Posterior dislocation of the talus is also seen. IMPRESSION: Ankle fracture- dislocation, as described above . Electronically Signed   By: Earle Gell M.D.   On: 06/19/2017 19:35    Assessment/Plan: 1 Day Post-Op   Active Problems:   Closed right ankle fracture   Ankle fracture   Patient is nervous intact in right lower extremity. She is having pain in the right ankle. Continue strict elevation  and application of ice packs. Continue IV and by mouth narcotic as needed. She is nonweightbearing on the right lower extremity. She'll remain overnight for pain management.  Will reassess her pain tomorrow.  Labs are within acceptable limits today.   Thornton Park , MD 06/21/2017, 12:43 PM

## 2017-06-21 NOTE — Progress Notes (Signed)
OT Cancellation Note  Patient Details Name: Ellen Jackson MRN: [id number] DOB: Feb 24, 1991   Cancelled Treatment:    Reason Eval/Treat Not Completed: Patient declined, no reason specified. Order received, chart reviewed. Pt just received pain meds, requests to hold OT evaluation at this time to allow for her pain meds to kick in. Will re-attempt OT evaluation at later time as pt is willing/able to participate.   Jeni Salles, MPH, MS, OTR/L ascom [phone] 06/21/17, 9:10 AM

## 2017-06-21 NOTE — Care Management Note (Signed)
Case Management Note  Patient Details  Name: MARKETIA STALLSMITH MRN: [id number] Date of Birth: 07-29-1991  Subjective/Objective:  Admitted with a right ankle fracture. Lives at home with her mom and children. PT recommending home health PT. Offered choice of home health agencies. Referral to [facility] for [facility] health PT. Ordered walker and bsc from [facility].                    Action/Plan: [facility] for HPT. DME with [facility]  Expected Discharge Date:                  Expected Discharge Plan:  [place]  In-House Referral:     Discharge planning Services  CM Consult  Post Acute Care Choice:  Durable Medical Equipment, Home Health Choice offered to:  Patient, Parent  DME Arranged:  Bedside commode, Walker rolling DME Agency:  [place]:  PT [facility] Agency:  [place]  Status of Service:  In process, will continue to follow  If discussed at Long Length of Stay Meetings, dates discussed:    Additional Comments:  Jolly Mango, RN 06/21/2017, 11:31 AM

## 2017-06-21 NOTE — Evaluation (Signed)
Physical Therapy Evaluation Patient Details Name: Ellen Jackson MRN: [id number] DOB: [birth year] Today's Date: 06/21/2017   History of Present Illness  Pt admitted for R ankle fracture and is now s/p ORIF on 10/28. History includes fibromyalgia, GERD, and spina bifida. Pt complains of fall at [facility] in the [place] while stepping in a hole.  Clinical Impression  Pt is a pleasant 26 year old female who was admitted for R ankle fx s/p ORIF. Pt performs bed mobility with min assist, transfers with min assist, and ambulation with cga and RW. Pt demonstrates deficits with strength/ambualtion/endurance. Pt is very fearful of movement, needs heavy encouragement. Would benefit from skilled PT to address above deficits and promote optimal return to PLOF. Recommend transition to [place] upon discharge from acute hospitalization. Needs RW at all times and will need stair training prior to dc.       Follow Up Recommendations Home health PT;Supervision/Assistance - 24 hour    Equipment Recommendations  Rolling walker with 5" wheels;3in1 (PT)    Recommendations for Other Services       Precautions / Restrictions Precautions Precautions: Fall Restrictions Weight Bearing Restrictions: Yes RLE Weight Bearing: Non weight bearing      Mobility  Bed Mobility Overal bed mobility: Needs Assistance Bed Mobility: Supine to Sit     Supine to sit: Min assist     General bed mobility comments: needs assist from her arms to lift R leg up and over towards EOB. Once seated at EOB, able to sit with upright posture.  Transfers Overall transfer level: Needs assistance Equipment used: Rolling walker (2 wheeled) Transfers: Sit to/from Stand Sit to Stand: Min assist         General transfer comment: able to progress with several sit<>stands to only needing cga. RW used with safe technique and cues to keep L foot under her. Able to maintain correct WBing once standing.  Ambulation/Gait Ambulation/Gait  assistance: Min guard Ambulation Distance (Feet): 2 Feet Assistive device: Rolling walker (2 wheeled) Gait Pattern/deviations: Step-to pattern     General Gait Details: able to pivot/hop towards BSC and then transfer to recliner. Safe technique used with RW.Marland KitchenAble to maintain correct WBing status. Fatigues with increased mobility.  Stairs            Wheelchair Mobility    Modified Rankin (Stroke Patients Only)       Balance Overall balance assessment: History of Falls;Needs assistance Sitting-balance support: Feet supported Sitting balance-Leahy Scale: Good     Standing balance support: Bilateral upper extremity supported Standing balance-Leahy Scale: Good                               Pertinent Vitals/Pain Pain Assessment: 0-10 Pain Score: 7  Pain Location: R ankle Pain Descriptors / Indicators: Operative site guarding Pain Intervention(s): Limited activity within patient's tolerance;Premedicated before session;Ice applied    Home Living Family/patient expects to be discharged to:: Private residence Living Arrangements: Children;Parent Available Help at Discharge: Family;Available 24 hours/day Type of Home: House Home Access: Stairs to enter Entrance Stairs-Rails: None Entrance Stairs-Number of Steps: 1 followed by platform and then threshold into door Home Layout: One level Home Equipment: Wheelchair - manual      Prior Function Level of Independence: Independent               Hand Dominance        Extremity/Trunk Assessment   Upper Extremity Assessment Upper Extremity Assessment:  Overall WFL for tasks assessed    Lower Extremity Assessment Lower Extremity Assessment: Generalized weakness (R LE grossly 3/5; L LE grossly 4+/5)       Communication   Communication: No difficulties  Cognition Arousal/Alertness: Awake/alert Behavior During Therapy: WFL for tasks assessed/performed Overall Cognitive Status: Within Functional  Limits for tasks assessed                                        General Comments      Exercises Other Exercises Other Exercises: B LE there-x including hip abd/add, and SLRs. Also performed L LE quad sets. All ther-ex x 10 reps with supervision on L and min assist on R. Pt very hesitant and fearful of movement. Other Exercises: Pt performing tolieting with supervision for hygiene. Needs assist and set up to transfer and line up (including moving hands down to toliet)   Assessment/Plan    PT Assessment Patient needs continued PT services  PT Problem List Decreased strength;Decreased activity tolerance;Decreased balance;Decreased mobility;Decreased knowledge of use of DME;Pain       PT Treatment Interventions DME instruction;Gait training;Stair training;Therapeutic exercise;Balance training    PT Goals (Current goals can be found in the Care Plan section)  Acute Rehab PT Goals Patient Stated Goal: to go home PT Goal Formulation: With patient Time For Goal Achievement: 07/05/17 Potential to Achieve Goals: Good    Frequency BID   Barriers to discharge        Co-evaluation               AM-PAC PT "6 Clicks" Daily Activity  Outcome Measure Difficulty turning over in bed (including adjusting bedclothes, sheets and blankets)?: None Difficulty moving from lying on back to sitting on the side of the bed? : Unable Difficulty sitting down on and standing up from a chair with arms (e.g., wheelchair, bedside commode, etc,.)?: Unable Help needed moving to and from a bed to chair (including a wheelchair)?: A Little Help needed walking in hospital room?: A Lot Help needed climbing 3-5 steps with a railing? : A Lot 6 Click Score: 13    End of Session Equipment Utilized During Treatment: Gait belt Activity Tolerance: Patient tolerated treatment well Patient left: in chair;with chair alarm set;with family/visitor present;with SCD's reapplied Nurse Communication:  Mobility status PT Visit Diagnosis: Unsteadiness on feet (R26.81);Muscle weakness (generalized) (M62.81);Pain Pain - Right/Left: Right Pain - part of body: Ankle and joints of foot    Time: 2993-7169 PT Time Calculation (min) (ACUTE ONLY): 30 min   Charges:   PT Evaluation $PT Eval Moderate Complexity: 1 Mod PT Treatments $Therapeutic Exercise: 8-22 mins   PT G Codes:   PT G-Codes **NOT FOR INPATIENT CLASS** Functional Assessment Tool Used: AM-PAC 6 Clicks Basic Mobility Functional Limitation: Mobility: Walking and moving around Mobility: Walking and Moving Around Current Status (C7893): At least 40 percent but less than 60 percent impaired, limited or restricted Mobility: Walking and Moving Around Goal Status [phone]): At least 20 percent but less than 40 percent impaired, limited or restricted    Greggory Stallion, PT, DPT [phone]   Zyrell Carmean 06/21/2017, 11:20 AM

## 2017-06-21 NOTE — Evaluation (Signed)
Occupational Therapy Evaluation Patient Details Name: Ellen Jackson MRN: [id number] DOB: 01/02/1991 Today's Date: 06/21/2017    History of Present Illness Pt admitted for R ankle fracture and is now s/p ORIF on 10/28. History includes fibromyalgia, GERD, and spina bifida. Pt complains of fall at [facility] in the [place] while stepping in a hole.   Clinical Impression   Pt seen for OT evaluation this date, POD#1 from R ankle ORIF. Pt was independent prior to fall and hospitalization, lives with 28yo and 10yo children and mother in a duplex with 2 steps to enter the home. Pt presents with significant pain 10/10 in R ankle with muscle spasms, despite recent pain meds. Pt min guard for transfers with good BUE support on RW while maintaining RLE NWBing precautions throughout transitions from recliner to [facility] and then to EOB. Min assist for RLE mgt back to bed and placement of pillows for elevation and ice pack for RLE. Despite pain, pt did well with mobility and no LOB noted. Pt/parent educated in use of w/c for transfers from car to home due to re-paving of parking lot limiting close access to the home. Pt educated in use of [facility] for bathing and toileting needs as well as home/routine modifications to maximize functional independence and minimize falls risk. Pt limited by pain this date and will benefit from skilled OT services while in the hospital to maximize safety and functional independence with ADL prior to return home. No follow up OT recommended at this time.    Follow Up Recommendations  No OT follow up    Equipment Recommendations  3 in 1 bedside commode    Recommendations for Other Services       Precautions / Restrictions Precautions Precautions: Fall Restrictions Weight Bearing Restrictions: Yes RLE Weight Bearing: Non weight bearing      Mobility Bed Mobility Overal bed mobility: Needs Assistance Bed Mobility: Sit to Supine     Supine to sit: Min assist     General bed mobility  comments: min assist for RLE mgt   Transfers Overall transfer level: Needs assistance Equipment used: Rolling walker (2 wheeled) Transfers: Sit to/from [facility] Sit to Stand: Min guard Stand pivot transfers: Min guard       General transfer comment:  Able to maintain correct WBing once standing.    Balance Overall balance assessment: History of Falls;Needs assistance Sitting-balance support: Feet supported Sitting balance-Leahy Scale: Good     Standing balance support: Bilateral upper extremity supported Standing balance-Leahy Scale: Good                             ADL either performed or assessed with clinical judgement   ADL Overall ADL's : Needs assistance/impaired Eating/Feeding: Sitting;Set up   Grooming: Sitting;Set up   Upper Body Bathing: Sitting;Set up   Lower Body Bathing: Sitting/lateral leans;Minimal assistance;With caregiver independent assisting   Upper Body Dressing : Sitting;Set up   Lower Body Dressing: Sitting/lateral leans;Minimal assistance;With caregiver independent assisting   Toilet Transfer: RW;BSC;Cueing for safety;Min guard;Stand-pivot Toilet Transfer Details (indicate cue type and reason): SPT to [facility] with RW and verbal cues for pivoting on L foot, with pt able to maintain RLE NWB'ing precautions throughout, no LOB, good BUE support Toileting- Clothing Manipulation and Hygiene: Set up;Sitting/lateral lean       Functional mobility during ADLs: Min guard;Rolling walker       Vision Baseline Vision/History: Wears glasses Wears Glasses: At all  times Patient Visual Report: No change from baseline Vision Assessment?: No apparent visual deficits     Perception     Praxis      Pertinent Vitals/Pain Pain Assessment: 0-10 Pain Score: 10-Worst pain ever Pain Location: R ankle, RLE spasms Pain Descriptors / Indicators: Operative site guarding;Aching;Moaning;Crying;Grimacing;Restless;Spasm Pain  Intervention(s): Limited activity within patient's tolerance;Patient requesting pain meds-RN notified;Repositioned;Ice applied;Monitored during session;Premedicated before session     Hand Dominance     Extremity/Trunk Assessment Upper Extremity Assessment Upper Extremity Assessment: Overall WFL for tasks assessed   Lower Extremity Assessment Lower Extremity Assessment: Generalized weakness;Defer to PT evaluation   Cervical / Trunk Assessment Cervical / Trunk Assessment: Normal   Communication Communication Communication: No difficulties   Cognition Arousal/Alertness: Awake/alert Behavior During Therapy: WFL for tasks assessed/performed Overall Cognitive Status: Within Functional Limits for tasks assessed                                     General Comments       Exercises Exercises: Other exercises Other Exercises Other Exercises: Pt/family educated in use of [facility] for toileting needs and problem solving mobility from parking lot to home using wheelchair given distance due to parking lot being re-paved.  Other Exercises: Pt performing tolieting with supervision for hygiene. Needs assist and set up to transfer and line up (including moving hands down to toliet)   Shoulder Instructions      Home Living Family/patient expects to be discharged to:: Private residence Living Arrangements: Children;Parent (10yo (with autism) and 7yo children ) Available Help at Discharge: Family;Available 24 hours/day Type of Home: House (duplex) Home Access: Stairs to enter Entrance Stairs-Number of Steps: 1 followed by platform and then threshold into door Entrance Stairs-Rails: None Home Layout: One level               Home Equipment: Wheelchair - manual   Additional Comments: Parent notes that the parking lot is currently being re-paved meaning they are unable to park close to the home which she/pt expressed great concern about.       Prior Functioning/Environment  Level of Independence: Independent        Comments: Pt indep with ADL, IADL, no falls except for fall that led to this admission.        OT Problem List: Pain;Decreased strength;Decreased knowledge of use of DME or AE      OT Treatment/Interventions: Self-care/ADL training;DME and/or AE instruction;Patient/family education    OT Goals(Current goals can be found in the care plan section) Acute Rehab OT Goals Patient Stated Goal: to go home OT Goal Formulation: With patient/family Time For Goal Achievement: 06/28/17 Potential to Achieve Goals: Good  OT Frequency: Min 1X/week   Barriers to D/C:            Co-evaluation              AM-PAC PT "6 Clicks" Daily Activity     Outcome Measure Help from another person eating meals?: None Help from another person taking care of personal grooming?: None Help from another person toileting, which includes using toliet, bedpan, or urinal?: A Little Help from another person bathing (including washing, rinsing, drying)?: A Little Help from another person to put on and taking off regular upper body clothing?: None Help from another person to put on and taking off regular lower body clothing?: A Little 6 Click Score: 21   End of  Session Equipment Utilized During Treatment: Gait belt;Rolling walker  Activity Tolerance: Patient limited by pain Patient left: in bed;with call bell/phone within reach;with bed alarm set;with family/visitor present;Other (comment) (RLE elevated, ice pack in position)  OT Visit Diagnosis: Other abnormalities of gait and mobility (R26.89);Pain Pain - Right/Left: Right Pain - part of body: Ankle and joints of foot                Time: 7473-4037 OT Time Calculation (min): 32 min Charges:  OT General Charges $OT Visit: 1 Visit OT Evaluation $OT Eval Low Complexity: 1 Low OT Treatments $Self Care/Home Management : 23-37 mins G-Codes: OT G-codes **NOT FOR INPATIENT CLASS** Functional Assessment Tool Used:  Clinical judgement;AM-PAC 6 Clicks Daily Activity Functional Limitation: Self care Self Care Current Status (Q9643): At least 20 percent but less than 40 percent impaired, limited or restricted Self Care Goal Status (C3818): At least 1 percent but less than 20 percent impaired, limited or restricted   Jeni Salles, MPH, MS, OTR/L ascom [phone] 06/21/17, 12:06 PM

## 2017-06-22 ENCOUNTER — Other Ambulatory Visit: Payer: Self-pay

## 2017-06-22 LAB — CBC
HCT: 37.5 % (ref 35.0–47.0)
Hemoglobin: 12.5 g/dL (ref 12.0–16.0)
MCH: 30.7 pg (ref 26.0–34.0)
MCHC: 33.3 g/dL (ref 32.0–36.0)
MCV: 92 fL (ref 80.0–100.0)
PLATELETS: 164 10*3/uL (ref 150–440)
RBC: 4.07 MIL/uL (ref 3.80–5.20)
RDW: 13.1 % (ref 11.5–14.5)
WBC: 10 10*3/uL (ref 3.6–11.0)

## 2017-06-22 LAB — BASIC METABOLIC PANEL
Anion gap: 7 (ref 5–15)
BUN: 10 mg/dL (ref 6–20)
CO2: 26 mmol/L (ref 22–32)
CREATININE: 0.72 mg/dL (ref 0.44–1.00)
Calcium: 8.5 mg/dL — ABNORMAL LOW (ref 8.9–10.3)
Chloride: 105 mmol/L (ref 101–111)
GFR calc Af Amer: >60 mL/min (ref >60)
Glucose, Bld: 100 mg/dL — ABNORMAL HIGH (ref 65–99)
Potassium: 3.4 mmol/L — ABNORMAL LOW (ref 3.5–5.1)
SODIUM: 138 mmol/L (ref 135–145)

## 2017-06-22 NOTE — Progress Notes (Addendum)
  Subjective:  Patient seen at 2:30 PM today.   She was lying in bed with right leg elevated on pillows.  POD #2 right ankle ORIF.  Patient reports right ankle pain as marked.    Objective:   VITALS:   Vitals:   06/21/17 0908 06/21/17 1700 06/21/17 2001 06/22/17 1116  BP: 115/72 137/87 134/86 115/78  Pulse: 66 74 77 90  Resp:      Temp: 98.7 F (37.1 C) 98.3 F (36.8 C) 98.9 F (37.2 C) 98.4 F (36.9 C)  TempSrc: Oral Oral Oral Oral  SpO2: 98% 98% 97% 95%  Weight:      Height:        PHYSICAL EXAM: Right ankle; She can flex and extend her toes.  Sensation intact distally Toes well perfused Bandages: no drainage  LABS  Results for orders placed or performed during the hospital encounter of 06/19/17 (from the past 24 hour(s))  CBC     Status: None   Collection Time: 06/22/17  4:26 AM  Result Value Ref Range   WBC 10.0 3.6 - 11.0 K/uL   RBC 4.07 3.80 - 5.20 MIL/uL   Hemoglobin 12.5 12.0 - 16.0 g/dL   HCT 37.5 35.0 - 47.0 %   MCV 92.0 80.0 - 100.0 fL   MCH 30.7 26.0 - 34.0 pg   MCHC 33.3 32.0 - 36.0 g/dL   RDW 13.1 11.5 - 14.5 %   Platelets 164 150 - 440 K/uL  Basic metabolic panel     Status: Abnormal   Collection Time: 06/22/17  4:26 AM  Result Value Ref Range   Sodium 138 135 - 145 mmol/L   Potassium 3.4 (L) 3.5 - 5.1 mmol/L   Chloride 105 101 - 111 mmol/L   CO2 26 22 - 32 mmol/L   Glucose, Bld 100 (H) 65 - 99 mg/dL   BUN 10 6 - 20 mg/dL   Creatinine, Ser 0.72 0.44 - 1.00 mg/dL   Calcium 8.5 (L) 8.9 - 10.3 mg/dL   GFR calc non Af Amer >60 >60 mL/min   GFR calc Af Amer >60 >60 mL/min   Anion gap 7 5 - 15    No results found.  Assessment/Plan: 2 Days Post-Op   Active Problems:   Closed right ankle fracture   Ankle fracture  Continue oxycodone and IV morphine for pain.  Continue elevation of right LE.  Continue icing right ankle.  COntinue PT.   Possible discharge tomorrow.    Thornton Park , MD 06/22/2017, 5:08 PM

## 2017-06-22 NOTE — Progress Notes (Signed)
PT Cancellation Note  Patient Details Name: Ellen Jackson MRN: [id number] DOB: 1991/07/15   Cancelled Treatment:    Reason Eval/Treat Not Completed: Other (comment).  Pt and mother report they were told by MD to hold afternoon PT session for 24 hr pain control and to start PT again morning of 10/31.  Will continue to follow pt acutely and will return to see pt again in the morning of 10/31.     Collie Siad PT, DPT 06/22/2017, 3:36 PM

## 2017-06-22 NOTE — Progress Notes (Signed)
Physical Therapy Treatment Patient Details Name: Ellen Jackson MRN: [id number] DOB: 1991/03/15 Today's Date: 06/22/2017    History of Present Illness Pt admitted for R ankle fracture and is now s/p ORIF on 10/28. History includes fibromyalgia, GERD, and spina bifida. Pt complains of fall at [facility] in the [place] while stepping in a hole.    PT Comments    Carrigan made good progress with PT this session, completing stair training with assist from her mother and supervision from this therapist for safety.  Mother presents very anxious regarding pt's mobility despite education and reassurance provided. Follow up recommendations remain appropriate.    Follow Up Recommendations  Home health PT;Supervision/Assistance - 24 hour     Equipment Recommendations  Rolling walker with 5" wheels;3in1 (PT)    Recommendations for Other Services       Precautions / Restrictions Precautions Precautions: Fall Restrictions Weight Bearing Restrictions: Yes RLE Weight Bearing: Non weight bearing    Mobility  Bed Mobility Overal bed mobility: Needs Assistance Bed Mobility: Supine to Sit;Sit to Supine     Supine to sit: Supervision Sit to supine: Supervision   General bed mobility comments: Supervision for safety.  Pt requires increased time but no physical assist.  Transfers Overall transfer level: Needs assistance Equipment used: Rolling walker (2 wheeled) Transfers: Sit to/from Stand Sit to Stand: Supervision         General transfer comment: Pt performs with safe technique and no signs of instability.  Pt slow to stand.  Supervision for safety.   Ambulation/Gait Ambulation/Gait assistance: Min guard Ambulation Distance (Feet): 4 Feet Assistive device: Rolling walker (2 wheeled) Gait Pattern/deviations: Step-to pattern (hop to) Gait velocity: decreased Gait velocity interpretation: Below normal speed for age/gender General Gait Details: Close min guard for safety but pt steady.  Pt  ambulated to step for stair training and back to bed.     Stairs Stairs: Yes   Stair Management: No rails;Backwards;Step to pattern Number of Stairs: 1 General stair comments: Demonstrated and provided verbal cues for proper technique.  Instructed pt's mother in how to steady RW for safety and mother demonstrated understanding back to this therapist.  Pt completed stair training with no sign of instability.    Wheelchair Mobility    Modified Rankin (Stroke Patients Only)       Balance Overall balance assessment: Needs assistance Sitting-balance support: No upper extremity supported;Feet supported Sitting balance-Leahy Scale: Good     Standing balance support: Bilateral upper extremity supported;During functional activity Standing balance-Leahy Scale: Poor Standing balance comment: Relies on UE support for static and dynamic activities                            Cognition Arousal/Alertness: Awake/alert Behavior During Therapy: WFL for tasks assessed/performed Overall Cognitive Status: Within Functional Limits for tasks assessed                                        Exercises General Exercises - Lower Extremity Quad Sets: Strengthening;Right;10 reps;Supine Long Arc Quad: Strengthening;Right;5 reps;Seated Straight Leg Raises: Right;10 reps;Supine;Strengthening    General Comments General comments (skin integrity, edema, etc.): Mother present during session and appears very anxious and worried about assisting pt at home.  This PT educated pt's mother on techniques to assist pt into the home and reassured mother of pt's current mobility status.  Pertinent Vitals/Pain Pain Assessment: Faces Faces Pain Scale: Hurts even more Pain Location: R ankle, RLE spasms Pain Descriptors / Indicators: Operative site guarding;Aching;Grimacing Pain Intervention(s): Limited activity within patient's tolerance;Monitored during session;Repositioned     Home Living                      Prior Function            PT Goals (current goals can now be found in the care plan section) Acute Rehab PT Goals Patient Stated Goal: to go home PT Goal Formulation: With patient Time For Goal Achievement: 07/05/17 Potential to Achieve Goals: Good Progress towards PT goals: Progressing toward goals    Frequency    BID      PT Plan Current plan remains appropriate    Co-evaluation              AM-PAC PT "6 Clicks" Daily Activity  Outcome Measure  Difficulty turning over in bed (including adjusting bedclothes, sheets and blankets)?: None Difficulty moving from lying on back to sitting on the side of the bed? : A Little Difficulty sitting down on and standing up from a chair with arms (e.g., wheelchair, bedside commode, etc,.)?: A Little Help needed moving to and from a bed to chair (including a wheelchair)?: A Little Help needed walking in hospital room?: A Little Help needed climbing 3-5 steps with a railing? : A Little 6 Click Score: 19    End of Session Equipment Utilized During Treatment: Gait belt Activity Tolerance: Patient tolerated treatment well;Patient limited by pain Patient left: with call bell/phone within reach;in bed;with bed alarm set;with family/visitor present;with SCD's reapplied Nurse Communication: Mobility status PT Visit Diagnosis: Unsteadiness on feet (R26.81);Muscle weakness (generalized) (M62.81);Pain Pain - Right/Left: Right Pain - part of body: Ankle and joints of foot     Time: 8341-9622 PT Time Calculation (min) (ACUTE ONLY): 24 min  Charges:  $Gait Training: 8-22 mins $Therapeutic Activity: 8-22 mins                    G Codes:       Collie Siad PT, DPT 06/22/2017, 2:49 PM

## 2017-06-23 LAB — CBC
HCT: 37.8 % (ref 35.0–47.0)
Hemoglobin: 12.9 g/dL (ref 12.0–16.0)
MCH: 31.1 pg (ref 26.0–34.0)
MCHC: 34.2 g/dL (ref 32.0–36.0)
MCV: 90.8 fL (ref 80.0–100.0)
PLATELETS: 184 10*3/uL (ref 150–440)
RBC: 4.16 MIL/uL (ref 3.80–5.20)
RDW: 13.3 % (ref 11.5–14.5)
WBC: 8.8 10*3/uL (ref 3.6–11.0)

## 2017-06-23 MED ORDER — OXYCODONE HCL 5 MG PO TABS: 5.0000 mg | ORAL_TABLET | Freq: Four times a day (QID) | ORAL | 0 refills | Status: DC | PRN

## 2017-06-23 MED ORDER — ASPIRIN EC 325 MG PO TBEC: 325.0000 mg | DELAYED_RELEASE_TABLET | Freq: Every day | ORAL | 0 refills | Status: DC

## 2017-06-23 MED ORDER — ALBUTEROL SULFATE (2.5 MG/3ML) 0.083% IN NEBU: 2.5000 mg | INHALATION_SOLUTION | Freq: Four times a day (QID) | RESPIRATORY_TRACT | 12 refills | Status: DC | PRN

## 2017-06-23 NOTE — Care Management Note (Signed)
Case Management Note  Patient Details  Name: Ellen Jackson MRN: [id number] Date of Birth: 08-01-1991  Subjective/Objective:  Discharging today                 Action/Plan: Advanced notified of discharge. DME has been delivered.   Expected Discharge Date:  06/23/17               Expected Discharge Plan:  [place]  In-House Referral:     Discharge planning Services  CM Consult  Post Acute Care Choice:  Durable Medical Equipment, Home Health Choice offered to:  Patient, Parent  DME Arranged:  Bedside commode, Walker rolling DME Agency:  [place]:  PT [place]:  [place]  Status of Service:  Completed, signed off  If discussed at [place] of Stay Meetings, dates discussed:    Additional Comments:  Jolly Mango, RN 06/23/2017, 2:41 PM

## 2017-06-23 NOTE — Progress Notes (Signed)
PT Cancellation Note  Patient Details Name: Ellen Jackson MRN: [id number] DOB: [birth year]   Cancelled Treatment:    Reason Eval/Treat Not Completed: Other (comment).  Pt preparing for d/c.     Collie Siad PT, DPT 06/23/2017, 3:05 PM

## 2017-06-23 NOTE — Progress Notes (Signed)
  Subjective:  POD #3 s/p ORIF right ankle.  Patient reports pain as moderate.    Objective:   VITALS:   Vitals:   06/22/17 1116 06/22/17 1941 06/23/17 0755 06/23/17 0912  BP: 115/78 117/74 101/63 112/78  Pulse: 90 97 72 82  Resp:   20   Temp: 98.4 F (36.9 C) 98.2 F (36.8 C) 97.7 F (36.5 C)   TempSrc: Oral Oral Oral   SpO2: 95% 96% 97%   Weight:      Height:        PHYSICAL EXAM: Right lower extremity:  Dressing clean.  Moves all toes.    Neurovascular intact Sensation intact distally  LABS  Results for orders placed or performed during the hospital encounter of 06/19/17 (from the past 24 hour(s))  CBC     Status: None   Collection Time: 06/23/17  5:21 AM  Result Value Ref Range   WBC 8.8 3.6 - 11.0 K/uL   RBC 4.16 3.80 - 5.20 MIL/uL   Hemoglobin 12.9 12.0 - 16.0 g/dL   HCT 37.8 35.0 - 47.0 %   MCV 90.8 80.0 - 100.0 fL   MCH 31.1 26.0 - 34.0 pg   MCHC 34.2 32.0 - 36.0 g/dL   RDW 13.3 11.5 - 14.5 %   Platelets 184 150 - 440 K/uL    No results found.  Assessment/Plan: 3 Days Post-Op   Active Problems:   Closed right ankle fracture   Ankle fracture  Patient making appropriate progress with  PT.   Pain improving.  D/C home today.  Follow up in office in 10 days.    Thornton Park , MD 06/23/2017, 2:19 PM

## 2017-06-23 NOTE — Progress Notes (Signed)
OT Cancellation Note  Patient Details Name: Ellen Jackson MRN: [id number] DOB: [birth year]   Cancelled Treatment:    Reason Eval/Treat Not Completed: Other (comment). Upon attempt this afternoon, pt preparing for discharge.  Jeni Salles, MPH, MS, OTR/L ascom [phone] 06/23/17, 3:09 PM

## 2017-06-23 NOTE — Discharge Summary (Signed)
Physician Discharge Summary  Patient ID: Ellen Jackson MRN: [id number] DOB/AGE: [birth year] 26 y.o.  Admit date: 06/19/2017 Discharge date: 06/23/2017  Admission Diagnoses:  right trimalleolar ankle fracture with lateral ligament sprain <principal problem not specified>  Discharge Diagnoses:  right ankle fracture Active Problems:   Closed right ankle fracture s/p ORIF   Lateral ligament tear right ankle   Past Medical History:  Diagnosis Date  . Allergy   . Anxiety   . Dysrhythmia   . Fibromyalgia    everywhere  . GERD (gastroesophageal reflux disease)   . Hernia, umbilical   . 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 IBS (irritable bowel syndrome)   . Migraine with aura    bc powders is only thing that works.  . Ovarian cyst   . Spina bifida occulta   . SVT (supraventricular tachycardia) (HCC)     Surgeries: Procedure(s): OPEN REDUCTION INTERNAL FIXATION (ORIF) TRIMALLEOLAR ANKLE FRACTURE LATERAL LIGAMENT RECONSTRUCTION on 06/19/2017 - 06/20/2017   Consultants (if any):   Discharged Condition: Improved  Hospital Course: Ellen Jackson is an 26 y.o. female who was admitted 06/19/2017 with a diagnosis of  Trimalleolar right ankle fracture <principal problem not specified> and went to the operating room on 06/20/2017 and underwent ORIF.    She was given perioperative antibiotics:  Anti-infectives    Start     Dose/Rate Route Frequency Ordered Stop   06/20/17 1300  clindamycin (CLEOCIN) IVPB 600 mg     600 mg 100 mL/hr over 30 Minutes Intravenous Every 6 hours 06/20/17 1251 06/20/17 2111   06/20/17 0600  clindamycin (CLEOCIN) IVPB 600 mg     600 mg 100 mL/hr over 30 Minutes Intravenous On call to O.R. 06/19/17 2245 06/20/17 0932    .  She was given sequential compression devices, early ambulation, and lovenox for DVT prophylaxis.  She benefited maximally from the hospital stay and there were no complications.    Recent vital signs:  Vitals:   06/23/17 0755  06/23/17 0912  BP: 101/63 112/78  Pulse: 72 82  Resp: 20   Temp: 97.7 F (36.5 C)   SpO2: 97%     Recent laboratory studies:  Lab Results  Component Value Date   HGB 12.9 06/23/2017   HGB 12.5 06/22/2017   HGB 12.1 06/21/2017   Lab Results  Component Value Date   WBC 8.8 06/23/2017   PLT 184 06/23/2017   Lab Results  Component Value Date   INR 0.97 06/19/2017   Lab Results  Component Value Date   NA 138 06/22/2017   K 3.4 (L) 06/22/2017   CL 105 06/22/2017   CO2 26 06/22/2017   BUN 10 06/22/2017   CREATININE 0.72 06/22/2017   GLUCOSE 100 (H) 06/22/2017    Discharge Medications:   Allergies as of 06/23/2017      Reactions   Codeine    Other reaction(s): Other (See Comments) Passes out   Latex Itching, Rash   Lips itching   Morphine Itching, Shortness Of Breath   Nalbuphine Itching, Shortness Of Breath   Ondansetron Hcl Hives, Rash   Other Hives   Allergic to steri strips. tegaderm ok.. Paper tape is ok. Clear plastic tape rips off skin   Penicillins Rash   Patient reports severe rash and itching. Has patient had a PCN reaction causing immediate rash, facial/tongue/throat swelling, SOB or lightheadedness with hypotension: Yes Has patient had a PCN reaction causing severe rash involving mucus membranes or skin necrosis: No Has  patient had a PCN reaction that required hospitalization unknown Has patient had a PCN reaction occurring within the last 10 years:  unknown If all of the above answers are "NO", then may proceed   Codeine Sulfate Other (See Comments)   fainting   Metoclopramide Anxiety   Other reaction(s): Other (See Comments) "jittery" Difficulty breathing   Prochlorperazine Anxiety, Other (See Comments)   Difficulty breathing   Prochlorperazine Edisylate Anxiety   Promethazine Anxiety, Other (See Comments)   "jittery" Difficulty breathing   Tape    See above ... Paper tape ok   Tizanidine Other (See Comments)   Worsens headache       Medication List    STOP taking these medications   chlorhexidine 4 % external liquid Commonly known as:  HIBICLENS   ibuprofen 600 MG tablet Commonly known as:  ADVIL,MOTRIN   PROVENTIL HFA 108 (90 Base) MCG/ACT inhaler Generic drug:  albuterol Replaced by:  albuterol (2.5 MG/3ML) 0.083% nebulizer solution You also have another medication with the same name that you need to continue taking as instructed.   sulfamethoxazole-trimethoprim 800-160 MG tablet Commonly known as:  BACTRIM DS,SEPTRA DS   triamcinolone cream 0.1 % Commonly known as:  KENALOG     TAKE these medications   albuterol 108 (90 Base) MCG/ACT inhaler Commonly known as:  PROVENTIL HFA;VENTOLIN HFA Inhale 2 puffs into the lungs every 6 (six) hours as needed for wheezing or shortness of breath. What changed:  Another medication with the same name was added. Make sure you understand how and when to take each.  Another medication with the same name was removed. Continue taking this medication, and follow the directions you see here.   albuterol (2.5 MG/3ML) 0.083% nebulizer solution Commonly known as:  PROVENTIL Take 3 mLs (2.5 mg total) by nebulization every 6 (six) hours as needed for wheezing or shortness of breath. What changed:  You were already taking a medication with the same name, and this prescription was added. Make sure you understand how and when to take each. Replaces:  PROVENTIL HFA 108 (90 Base) MCG/ACT inhaler   aspirin EC 325 MG tablet Take 1 tablet (325 mg total) by mouth daily.   BC FAST PAIN RELIEF 650-195-33.3 MG Pack Generic drug:  Aspirin-Salicylamide-Caffeine Take 1 packet by mouth every 8 (eight) hours as needed (pain).   cyclobenzaprine 10 MG tablet Commonly known as:  FLEXERIL Take 1 tablet (10 mg total) by mouth 3 (three) times daily as needed for muscle spasms. What changed:  Another medication with the same name was removed. Continue taking this medication, and follow the  directions you see here.   LORazepam 0.5 MG tablet Commonly known as:  ATIVAN Take by mouth.   LYRICA 75 MG capsule Generic drug:  pregabalin Take by mouth.   LYRICA 150 MG capsule Generic drug:  pregabalin TAKE ONE CAPSULE BY MOUTH TWICE A DAY   medroxyPROGESTERone 150 MG/ML injection Commonly known as:  DEPO-PROVERA Inject 150 mg into the muscle every 3 (three) months. Next injection due 12-2016 What changed:  Another medication with the same name was removed. Continue taking this medication, and follow the directions you see here.   metoprolol succinate 25 MG 24 hr tablet Commonly known as:  TOPROL-XL TAKE 1 TABLET BY MOUTH EVERY DAY   metoprolol succinate 25 MG 24 hr tablet Commonly known as:  TOPROL XL Take 1.5 tablets (37.5 mg total) by mouth daily.   oxyCODONE 5 MG immediate release tablet Commonly known as:  Oxy IR/ROXICODONE Take 1 tablet (5 mg total) by mouth every 6 (six) hours as needed for severe pain.   polyethylene glycol packet Commonly known as:  MIRALAX / GLYCOLAX Take 17 g by mouth 2 (two) times daily as needed for mild constipation. What changed:  Another medication with the same name was removed. Continue taking this medication, and follow the directions you see here.   ranitidine 150 MG tablet Commonly known as:  ZANTAC Take 1 tablet (150 mg total) by mouth daily as needed for heartburn. What changed:  Another medication with the same name was removed. Continue taking this medication, and follow the directions you see here.   sucralfate 1 g tablet Commonly known as:  CARAFATE Take by mouth.   SUMAtriptan 100 MG tablet Commonly known as:  IMITREX Take 1 tablet earliest onset of migraine.  May repeat once in 2 hours if headache persists or recurs.  Do not exceed 2 tablets in 24 hours What changed:  Another medication with the same name was removed. Continue taking this medication, and follow the directions you see here.   venlafaxine XR 37.5 MG 24  hr capsule Commonly known as:  EFFEXOR XR Take 1 capsule daily with breakfast for 7 days, then increase to 2 capsules daily with breakfast. What changed:  how much to take  when to take this  additional instructions  Another medication with the same name was removed. Continue taking this medication, and follow the directions you see here.            Durable Medical Equipment        Start     Ordered   06/21/17 1610  For home use only DME Bedside commode  Once    Question:  Patient needs a bedside commode to treat with the following condition  Answer:  Ankle fracture   06/21/17 1610   06/21/17 1609  For home use only DME Walker rolling  Once    Question:  Patient needs a walker to treat with the following condition  Answer:  Ankle fracture   06/21/17 1610      Diagnostic Studies: Dg Ankle 2 Views Right  Result Date: 06/20/2017 CLINICAL DATA:  S/P right ankle ORIF EXAM: RIGHT ANKLE - 2 VIEW COMPARISON:  the previous day's study FINDINGS: Interval plate and screw fixation of the distal fibular shaft fracture. Orthopedic anchor in the lateral malleolus. Interval orthopedic screw fixation of medial malleolar and posterior malleolar fractures. Fragments in near anatomic alignment. Medial and lateral skin staples. Overlying cast material obscures fine bone detail. IMPRESSION: 1. Interval internal fixation of  trimalleolar fracture. Electronically Signed   By: Lucrezia Europe M.D.   On: 06/20/2017 13:12   Dg Ankle Complete Right  Result Date: 06/19/2017 CLINICAL DATA:  Post reduction EXAM: RIGHT ANKLE - COMPLETE 3+ VIEW COMPARISON:  06/19/2017 FINDINGS: Interval placement of cast material which obscures bone detail. Slightly comminuted fracture involving the distal shaft of the fibula at the junction of the middle and distal thirds with residual 1/2 shaft diameter of lateral and posterior displacement. Minimally displaced medial malleolar fracture. Re- demonstrated displaced posterior  malleolar fracture. Reduction of posteriorly dislocated talus. IMPRESSION: 1. Placement of cast material 2. Slight decreased displacement of distal fibular fracture 3. Reduction of ankle dislocation. Re- demonstrated fractures of the medial and posterior malleolus Electronically Signed   By: Donavan Foil M.D.   On: 06/19/2017 22:07   Dg Ankle Complete Right  Result Date: 06/19/2017 CLINICAL DATA:  Stepped in hole today. Severe ankle pain and deformity. Initial encounter. EXAM: RIGHT ANKLE - COMPLETE 3+ VIEW COMPARISON:  08/16/2015 FINDINGS: Displaced fractures are seen through the medial and posterior malleoli of the distal tibia. There is also a displaced oblique fracture of the distal fibular diaphysis. Posterior dislocation of the talus is also seen. IMPRESSION: Ankle fracture- dislocation, as described above . Electronically Signed   By: Earle Gell M.D.   On: 06/19/2017 19:35    Disposition: 01-Home or Self Care  Discharge Instructions    Call MD / Call 911    Complete by:  As directed    If you experience chest pain or shortness of breath, CALL 911 and be transported to the hospital emergency room.  If you develope a fever above 101 F, pus (white drainage) or increased drainage or redness at the wound, or calf pain, call your surgeon's office.   Constipation Prevention    Complete by:  As directed    Drink plenty of fluids.  Prune juice may be helpful.  You may use a stool softener, such as Colace (over the counter) 100 mg twice a day.  Use MiraLax (over the counter) for constipation as needed.   Diet - low sodium heart healthy    Complete by:  As directed    Discharge instructions    Complete by:  As directed    Keep the right leg elevated at all times.  May apply ice to right ankle as needed.   DO NOT put any weight on the right leg.  Use a walker to help you get around.   Keep dressing on and dry until follow up.  Cover bandages/splint with plastic bag to shower.  Follow up in 10 days  in the office.   Take aspirin 325 mg PO BID for blood clot prevention.   Driving restrictions    Complete by:  As directed    No driving for 6-75 weeks   Increase activity slowly as tolerated    Complete by:  As directed    Lifting restrictions    Complete by:  As directed    No lifting for 12-16 weeks      Follow-up Information    Thornton Park, MD.   Specialty:  Orthopedic Surgery Contact information: [place] [address] [place].   Specialty:  Emergency Medicine Why:  If symptoms worsen Contact information: [place] [id number] [address] [place] [phone]           Signed: Thornton Park ,MD 06/23/2017, 2:30 PM

## 2017-06-23 NOTE — Progress Notes (Signed)
Physical Therapy Treatment Patient Details Name: Ellen Jackson MRN: [id number] DOB: [birth year] Today's Date: 06/23/2017    History of Present Illness Pt admitted for R ankle fracture and is now s/p ORIF on 10/28. History includes fibromyalgia, GERD, and spina bifida. Pt complains of fall at [facility] in the [place] while stepping in a hole.    PT Comments    Arnice continues to make good progress toward PT goals.  She ambulated 60 ft with RW with supervision and cues for safety.   She completed therapeutic exercises without issue.    Follow Up Recommendations  Home health PT;Supervision/Assistance - 24 hour     Equipment Recommendations  Rolling walker with 5" wheels;3in1 (PT)    Recommendations for Other Services       Precautions / Restrictions Precautions Precautions: Fall Restrictions Weight Bearing Restrictions: Yes RLE Weight Bearing: Non weight bearing    Mobility  Bed Mobility               General bed mobility comments: Pt sitting in chair at start and end of session  Transfers Overall transfer level: Needs assistance Equipment used: Rolling walker (2 wheeled) Transfers: Sit to/from Stand Sit to Stand: Supervision         General transfer comment: Pt performs with safe technique and no signs of instability.  Pt slow to stand.  Supervision for safety.   Ambulation/Gait Ambulation/Gait assistance: Supervision Ambulation Distance (Feet): 60 Feet Assistive device: Rolling walker (2 wheeled) Gait Pattern/deviations: Step-to pattern (hop to) Gait velocity: decreased Gait velocity interpretation: Below normal speed for age/gender General Gait Details: Supervision for safety.  Cues to be sure not to hop too close to front bar on RW to avoid posterior lean.  Pt's ambulatory distance limited by fatigue.    Stairs            Wheelchair Mobility    Modified Rankin (Stroke Patients Only)       Balance Overall balance assessment: Needs  assistance Sitting-balance support: No upper extremity supported;Feet supported Sitting balance-Leahy Scale: Good     Standing balance support: Bilateral upper extremity supported;During functional activity Standing balance-Leahy Scale: Poor Standing balance comment: Relies on UE support for static and dynamic activities                            Cognition Arousal/Alertness: Awake/alert Behavior During Therapy: WFL for tasks assessed/performed Overall Cognitive Status: Within Functional Limits for tasks assessed                                        Exercises General Exercises - Lower Extremity Quad Sets: Strengthening;Right;10 reps;Seated Long Arc Quad: Strengthening;Right;5 reps;Seated Hip ABduction/ADduction: Strengthening;Right;5 reps;Seated Straight Leg Raises: Right;Strengthening;5 reps;Seated    General Comments General comments (skin integrity, edema, etc.): Mother present during treatment session      Pertinent Vitals/Pain Pain Assessment: Faces Faces Pain Scale: Hurts even more Pain Location: R ankle, RLE spasms Pain Descriptors / Indicators: Operative site guarding;Aching;Grimacing Pain Intervention(s): Limited activity within patient's tolerance;Monitored during session    Home Living                      Prior Function            PT Goals (current goals can now be found in the care plan section) Acute Rehab PT Goals  Patient Stated Goal: to go home PT Goal Formulation: With patient Time For Goal Achievement: 07/05/17 Potential to Achieve Goals: Good Progress towards PT goals: Progressing toward goals    Frequency    BID      PT Plan Current plan remains appropriate    Co-evaluation              AM-PAC PT "6 Clicks" Daily Activity  Outcome Measure  Difficulty turning over in bed (including adjusting bedclothes, sheets and blankets)?: None Difficulty moving from lying on back to sitting on the side  of the bed? : A Little Difficulty sitting down on and standing up from a chair with arms (e.g., wheelchair, bedside commode, etc,.)?: A Little Help needed moving to and from a bed to chair (including a wheelchair)?: A Little Help needed walking in hospital room?: A Little Help needed climbing 3-5 steps with a railing? : A Little 6 Click Score: 19    End of Session Equipment Utilized During Treatment: Gait belt Activity Tolerance: Patient tolerated treatment well;Patient limited by fatigue Patient left: with call bell/phone within reach;with family/visitor present;in chair;with chair alarm set Nurse Communication: Mobility status PT Visit Diagnosis: Unsteadiness on feet (R26.81);Muscle weakness (generalized) (M62.81);Pain Pain - Right/Left: Right Pain - part of body: Ankle and joints of foot     Time: 0940-7680 PT Time Calculation (min) (ACUTE ONLY): 21 min  Charges:  $Gait Training: 8-22 mins                    G Codes:       Collie Siad PT, DPT 06/23/2017, 12:06 PM

## 2017-06-23 NOTE — Progress Notes (Signed)
Discharge instructions and medication details reviewed with patient. Printed prescription for oxycodone and printed AVS given to patient. All questions answered. IV removed.  Patient escorted out via wheelchair.

## 2017-06-24 ENCOUNTER — Ambulatory Visit: Payer: Self-pay | Admitting: Surgery

## 2017-07-13 ENCOUNTER — Ambulatory Visit: Payer: Medicaid Other | Admitting: Family Medicine

## 2017-07-13 ENCOUNTER — Ambulatory Visit (INDEPENDENT_AMBULATORY_CARE_PROVIDER_SITE_OTHER): Payer: Medicaid Other

## 2017-07-13 ENCOUNTER — Ambulatory Visit: Payer: Self-pay | Admitting: *Deleted

## 2017-07-13 ENCOUNTER — Ambulatory Visit (INDEPENDENT_AMBULATORY_CARE_PROVIDER_SITE_OTHER): Payer: Medicaid Other | Admitting: Vascular Surgery

## 2017-07-13 ENCOUNTER — Encounter: Payer: Self-pay | Admitting: Family Medicine

## 2017-07-13 ENCOUNTER — Encounter (INDEPENDENT_AMBULATORY_CARE_PROVIDER_SITE_OTHER): Payer: Self-pay | Admitting: Vascular Surgery

## 2017-07-13 VITALS — BP 106/74 | HR 97 | Resp 17 | Ht 67.0 in | Wt 180.0 lb

## 2017-07-13 VITALS — BP 95/61 | HR 101 | Temp 98.3°F

## 2017-07-13 DIAGNOSIS — Z3042 Encounter for surveillance of injectable contraceptive: Secondary | ICD-10-CM | POA: Diagnosis not present

## 2017-07-13 DIAGNOSIS — I82439 Acute embolism and thrombosis of unspecified popliteal vein: Secondary | ICD-10-CM

## 2017-07-13 DIAGNOSIS — I82431 Acute embolism and thrombosis of right popliteal vein: Secondary | ICD-10-CM

## 2017-07-13 DIAGNOSIS — M79661 Pain in right lower leg: Secondary | ICD-10-CM

## 2017-07-13 DIAGNOSIS — S82891S Other fracture of right lower leg, sequela: Secondary | ICD-10-CM

## 2017-07-13 DIAGNOSIS — E7212 Methylenetetrahydrofolate reductase deficiency: Secondary | ICD-10-CM

## 2017-07-13 DIAGNOSIS — I82409 Acute embolism and thrombosis of unspecified deep veins of unspecified lower extremity: Secondary | ICD-10-CM

## 2017-07-13 DIAGNOSIS — Z1589 Genetic susceptibility to other disease: Secondary | ICD-10-CM

## 2017-07-13 HISTORY — DX: Acute embolism and thrombosis of unspecified deep veins of unspecified lower extremity: I82.409

## 2017-07-13 HISTORY — DX: Acute embolism and thrombosis of unspecified popliteal vein: I82.439

## 2017-07-13 NOTE — Assessment & Plan Note (Signed)
Her duplex demonstrates acute DVT in the right popliteal vein into the distal thigh/femoral vein.  The calf veins could not be evaluated secondary to her cast.  No proximal common femoral vein or iliac vein DVT was identified. I had a long discussion with she and her mother to her mother is very familiar with clotting issues.  This was clearly a provoked DVT with a recent injury, recent surgery, and mobility in a cast.  I have started her on Eliquis today and she will start on 10 mg twice daily and I have given her samples for that.  I have written her a prescription for 5 mg twice daily of Eliquis to start in 1 week.  This will need to be continued for at least 3 months and possibly longer.  Given the potential genetic issues, consideration for referral to a hematologist can also be given.  Once she is out of the cast, she should use a compression stocking and elevate her leg.  Increasing her activity as tolerated would also be recommended.  I will recheck an ultrasound in 2-3 months.

## 2017-07-13 NOTE — Progress Notes (Signed)
Patient ID: Ellen Jackson, female   DOB: 1991-03-16, 26 y.o.   MRN: [id number]  Chief Complaint  Patient presents with  . Follow-up    Positive for DVT    HPI Ellen Jackson is a 26 y.o. female.  I am asked to see the patient by Merrie Roof, PA for evaluation of right leg pain and DVT.  The patient reports concern 3 weeks ago and she has remained in a plaster cast.  Her mobility has obviously been quite poor.  Over the past 2-3 days she began developing pain behind her knee and in the posterior lower thigh area.  This was a new finding.  After evaluation by her orthopedic surgeon and her primary care provider, she was referred over for a duplex ultrasound.  She has not had any chest pain or shortness of breath.  No fever or chills.  Her duplex demonstrates acute DVT in the right popliteal vein into the distal thigh/femoral vein.  The calf veins could not be evaluated secondary to her cast.  No proximal common femoral vein or iliac vein DVT was identified.   Past Medical History:  Diagnosis Date  . Allergy   . Anxiety   . Dysrhythmia   . Fibromyalgia    everywhere  . GERD (gastroesophageal reflux disease)   . Hernia, umbilical   . 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 IBS (irritable bowel syndrome)   . Migraine with aura    bc powders is only thing that works.  . Ovarian cyst   . Spina bifida occulta   . SVT (supraventricular tachycardia) (HCC)     Past Surgical History:  Procedure Laterality Date  . APPENDECTOMY  2015  . CESAREAN SECTION  2011  . CHOLECYSTECTOMY  2005  . HERNIA REPAIR  9735   Umbilical Hernia Repair with mesh  . KNEE ARTHROSCOPY Left 2015   X 3. plate and pin in left shin placed after several surgeries. last surgery for this was 20154  . ORIF ANKLE FRACTURE Right 06/20/2017   Procedure: OPEN REDUCTION INTERNAL FIXATION (ORIF) TRIMALLEOLAR ANKLE FRACTURE LATERAL LIGAMENT RECONSTRUCTION;  Surgeon: Thornton Park, MD;  Location: [facility] ORS;  Service:  Orthopedics;  Laterality: Right;  . OVARIAN CYST SURGERY    . UMBILICAL HERNIA REPAIR N/A 10/09/2016   Procedure: HERNIA REPAIR UMBILICAL ADULT;  Surgeon: Olean Ree, MD;  Location: [facility] ORS;  Service: General;  Laterality: N/A;    Family History  Problem Relation Age of Onset  . Arthritis Mother   . Hyperlipidemia Mother   . Hypertension Mother   . Migraines Mother   . Arthritis Father   . Autism Son   . Cancer Maternal Grandfather        pancreatic  . Stroke Maternal Grandfather   . Diabetes Paternal Grandmother     Social History Social History   Tobacco Use  . Smoking status: Current Every Day Smoker    Packs/day: 0.50    Years: 3.00    Pack years: 1.50    Types: Cigarettes  . Smokeless tobacco: Never Used  Substance Use Topics  . Alcohol use: No    Alcohol/week: 0.0 oz  . Drug use: No     Allergies  Allergen Reactions  . Codeine     Other reaction(s): Other (See Comments) Passes out  . Latex Itching and Rash    Lips itching  . Morphine Itching and Shortness Of Breath  . Nalbuphine Itching and Shortness Of Breath  .  Ondansetron Hcl Hives and Rash  . Other Hives    Allergic to steri strips. tegaderm ok.. Paper tape is ok. Clear plastic tape rips off skin  . Penicillins Rash    Patient reports severe rash and itching. Has patient had a PCN reaction causing immediate rash, facial/tongue/throat swelling, SOB or lightheadedness with hypotension: Yes Has patient had a PCN reaction causing severe rash involving mucus membranes or skin necrosis: No Has patient had a PCN reaction that required hospitalization unknown Has patient had a PCN reaction occurring within the last 10 years:  unknown If all of the above answers are "NO", then may proceed  . Codeine Sulfate Other (See Comments)    fainting  . Metoclopramide Anxiety    Other reaction(s): Other (See Comments) "jittery" Difficulty breathing  . Prochlorperazine Anxiety and Other (See Comments)     Difficulty breathing  . Prochlorperazine Edisylate Anxiety  . Promethazine Anxiety and Other (See Comments)    "jittery" Difficulty breathing     . Tape     See above ... Paper tape ok  . Tizanidine Other (See Comments)    Worsens headache    Current Outpatient Medications  Medication Sig Dispense Refill  . albuterol (PROVENTIL HFA;VENTOLIN HFA) 108 (90 Base) MCG/ACT inhaler Inhale 2 puffs into the lungs every 6 (six) hours as needed for wheezing or shortness of breath. 1 Inhaler 0  . albuterol (PROVENTIL) (2.5 MG/3ML) 0.083% nebulizer solution Take 3 mLs (2.5 mg total) by nebulization every 6 (six) hours as needed for wheezing or shortness of breath. 75 mL 12  . LORazepam (ATIVAN) 0.5 MG tablet Take by mouth.    Marland Kitchen LYRICA 150 MG capsule TAKE ONE CAPSULE BY MOUTH TWICE A DAY 60 capsule 1  . medroxyPROGESTERone (DEPO-PROVERA) 150 MG/ML injection Inject 150 mg into the muscle every 3 (three) months. Next injection due 12-2016    . meloxicam (MOBIC) 15 MG tablet meloxicam 15 mg tablet  Take 1 tablet every day by oral route.    . metoprolol succinate (TOPROL XL) 25 MG 24 hr tablet Take 1.5 tablets (37.5 mg total) by mouth daily. 45 tablet 3  . polyethylene glycol (MIRALAX / GLYCOLAX) packet Take 17 g by mouth 2 (two) times daily as needed for mild constipation.     . pregabalin (LYRICA) 75 MG capsule Take by mouth.    . ranitidine (ZANTAC) 150 MG tablet Take 1 tablet (150 mg total) by mouth daily as needed for heartburn. 90 tablet 3  . SUMAtriptan (IMITREX) 100 MG tablet sumatriptan 100 mg tablet    . triamcinolone cream (KENALOG) 0.1 % APP EXT AA BID  0  . venlafaxine XR (EFFEXOR XR) 37.5 MG 24 hr capsule Take 1 capsule daily with breakfast for 7 days, then increase to 2 capsules daily with breakfast. (Patient taking differently: 37.5 mg 2 (two) times daily. ) 60 capsule 5  . aspirin EC 325 MG tablet Take 1 tablet (325 mg total) by mouth daily. (Patient not taking: Reported on 07/13/2017)  90 tablet 0  . cyclobenzaprine (FLEXERIL) 10 MG tablet Take 1 tablet (10 mg total) by mouth 3 (three) times daily as needed for muscle spasms. (Patient not taking: Reported on 03/09/2017) 30 tablet 0  . oxyCODONE (OXY IR/ROXICODONE) 5 MG immediate release tablet oxycodone 5 mg tablet  TK ONE C PO Q 6 H PRF SEVERE PAIN     Current Facility-Administered Medications  Medication Dose Route Frequency Provider Last Rate Last Dose  . medroxyPROGESTERone (DEPO-PROVERA) injection  150 mg  150 mg Intramuscular Q90 days Valerie Roys, DO   150 mg at 04/13/17 1112      REVIEW OF SYSTEMS (Negative unless checked)  Constitutional: '[]' Weight loss  '[]' Fever  '[]' Chills Cardiac: '[]' Chest pain   '[]' Chest pressure   '[x]' Palpitations   '[]' Shortness of breath when laying flat   '[]' Shortness of breath at rest   '[]' Shortness of breath with exertion. Vascular:  '[]' Pain in legs with walking   '[]' Pain in legs at rest   '[]' Pain in legs when laying flat   '[]' Claudication   '[]' Pain in feet when walking  '[]' Pain in feet at rest  '[]' Pain in feet when laying flat   '[x]' History of DVT   '[]' Phlebitis   '[x]' Swelling in legs   '[]' Varicose veins   '[]' Non-healing ulcers Pulmonary:   '[]' Uses home oxygen   '[]' Productive cough   '[]' Hemoptysis   '[]' Wheeze  '[]' COPD   '[]' Asthma Neurologic:  '[]' Dizziness  '[]' Blackouts   '[]' Seizures   '[]' History of stroke   '[]' History of TIA  '[]' Aphasia   '[]' Temporary blindness   '[]' Dysphagia   '[]' Weakness or numbness in arms   '[]' Weakness or numbness in legs Musculoskeletal:  '[x]' Arthritis   '[]' Joint swelling   '[x]' Joint pain   '[]' Low back pain Hematologic:  '[]' Easy bruising  '[]' Easy bleeding   '[]' Hypercoagulable state   '[]' Anemic  '[]' Hepatitis Gastrointestinal:  '[]' Blood in stool   '[]' Vomiting blood  '[x]' Gastroesophageal reflux/heartburn   '[]' Abdominal pain Genitourinary:  '[]' Chronic kidney disease   '[]' Difficult urination  '[]' Frequent urination  '[]' Burning with urination   '[]' Hematuria Skin:  '[]' Rashes   '[]' Ulcers   '[]' Wounds Psychological:  '[x]' History of  anxiety   '[]'  History of major depression.    Physical Exam BP 106/74 (BP Location: Right Arm, Patient Position: Sitting)   Pulse 97   Resp 17   Ht '5\' 7"'  (1.702 m)   Wt 81.6 kg (180 lb)   BMI 28.19 kg/m  Gen:  WD/WN, NAD Head: [place]/AT, No temporalis wasting.  Ear/Nose/Throat: Hearing grossly intact, nares w/o erythema or drainage, oropharynx w/o Erythema/Exudate Eyes: Conjunctiva clear, sclera non-icteric  Neck: trachea midline.  No JVD.  Pulmonary:  Good air movement, respirations not labored, no use of accessory muscles Cardiac: RRR, no JVD Vascular:  Vessel Right Left  Radial Palpable Palpable                          PT  unable to assess, in a cast Palpable  DP  unable to assess, in a cast Palpable   Gastrointestinal: soft, non-tender/non-distended.  Musculoskeletal: M/S 5/5 throughout.  Extremities without ischemic changes.  Right leg is in a cast up to just below the knee.  Unable to assess swelling.  Using a wheelchair. Neurologic: Sensation grossly intact in extremities.  Symmetrical.  Speech is fluent. Motor exam as listed above. Psychiatric: Judgment intact, Mood & affect appropriate for pt's clinical situation. Dermatologic: No rashes or ulcers noted.  No cellulitis or open wounds.    Radiology Dg Ankle 2 Views Right  Result Date: 06/20/2017 CLINICAL DATA:  S/P right ankle ORIF EXAM: RIGHT ANKLE - 2 VIEW COMPARISON:  the previous day's study FINDINGS: Interval plate and screw fixation of the distal fibular shaft fracture. Orthopedic anchor in the lateral malleolus. Interval orthopedic screw fixation of medial malleolar and posterior malleolar fractures. Fragments in near anatomic alignment. Medial and lateral skin staples. Overlying cast material obscures fine bone detail. IMPRESSION: 1. Interval internal fixation of  trimalleolar fracture. Electronically Signed  By: Lucrezia Europe M.D.   On: 06/20/2017 13:12   Dg Ankle Complete Right  Result Date:  06/19/2017 CLINICAL DATA:  Post reduction EXAM: RIGHT ANKLE - COMPLETE 3+ VIEW COMPARISON:  06/19/2017 FINDINGS: Interval placement of cast material which obscures bone detail. Slightly comminuted fracture involving the distal shaft of the fibula at the junction of the middle and distal thirds with residual 1/2 shaft diameter of lateral and posterior displacement. Minimally displaced medial malleolar fracture. Re- demonstrated displaced posterior malleolar fracture. Reduction of posteriorly dislocated talus. IMPRESSION: 1. Placement of cast material 2. Slight decreased displacement of distal fibular fracture 3. Reduction of ankle dislocation. Re- demonstrated fractures of the medial and posterior malleolus Electronically Signed   By: Donavan Foil M.D.   On: 06/19/2017 22:07   Dg Ankle Complete Right  Result Date: 06/19/2017 CLINICAL DATA:  Stepped in hole today. Severe ankle pain and deformity. Initial encounter. EXAM: RIGHT ANKLE - COMPLETE 3+ VIEW COMPARISON:  08/16/2015 FINDINGS: Displaced fractures are seen through the medial and posterior malleoli of the distal tibia. There is also a displaced oblique fracture of the distal fibular diaphysis. Posterior dislocation of the talus is also seen. IMPRESSION: Ankle fracture- dislocation, as described above . Electronically Signed   By: Earle Gell M.D.   On: 06/19/2017 19:35    Labs Recent Results (from the past 2160 hour(s))  Surgical PCR screen     Status: None   Collection Time: 06/19/17 10:47 PM  Result Value Ref Range   MRSA, PCR NEGATIVE NEGATIVE   Staphylococcus aureus NEGATIVE NEGATIVE    Comment: (NOTE) The Xpert SA Assay (FDA approved for NASAL specimens in patients 28 years of age and older), is one component of a comprehensive surveillance program. It is not intended to diagnose infection nor to guide or monitor treatment.   Protime-INR     Status: None   Collection Time: 06/19/17 11:24 PM  Result Value Ref Range   Prothrombin  Time 12.8 11.4 - 15.2 seconds   INR 0.97   CBC WITH DIFFERENTIAL     Status: None   Collection Time: 06/19/17 11:24 PM  Result Value Ref Range   WBC 8.8 3.6 - 11.0 K/uL   RBC 4.06 3.80 - 5.20 MIL/uL   Hemoglobin 12.7 12.0 - 16.0 g/dL   HCT 36.5 35.0 - 47.0 %   MCV 89.9 80.0 - 100.0 fL   MCH 31.2 26.0 - 34.0 pg   MCHC 34.7 32.0 - 36.0 g/dL   RDW 13.2 11.5 - 14.5 %   Platelets 184 150 - 440 K/uL   Neutrophils Relative % 60 %   Neutro Abs 5.4 1.4 - 6.5 K/uL   Lymphocytes Relative 27 %   Lymphs Abs 2.3 1.0 - 3.6 K/uL   Monocytes Relative 7 %   Monocytes Absolute 0.6 0.2 - 0.9 K/uL   Eosinophils Relative 5 %   Eosinophils Absolute 0.4 0 - 0.7 K/uL   Basophils Relative 1 %   Basophils Absolute 0.0 0 - 0.1 K/uL  APTT     Status: None   Collection Time: 06/19/17 11:24 PM  Result Value Ref Range   aPTT 25 24 - 36 seconds  HIV antibody (Routine Testing)     Status: None   Collection Time: 06/19/17 11:24 PM  Result Value Ref Range   HIV Screen 4th Generation wRfx Non Reactive Non Reactive    Comment: (NOTE) Performed At: [facility] [place], [place] [id number] Lindon Romp MD  JH:[id number]   CBC     Status: None   Collection Time: 06/20/17  3:15 AM  Result Value Ref Range   WBC 9.9 3.6 - 11.0 K/uL   RBC 4.12 3.80 - 5.20 MIL/uL   Hemoglobin 12.9 12.0 - 16.0 g/dL   HCT 37.7 35.0 - 47.0 %   MCV 91.5 80.0 - 100.0 fL   MCH 31.3 26.0 - 34.0 pg   MCHC 34.2 32.0 - 36.0 g/dL   RDW 13.1 11.5 - 14.5 %   Platelets 178 150 - 440 K/uL  Basic metabolic panel     Status: Abnormal   Collection Time: 06/20/17  3:15 AM  Result Value Ref Range   Sodium 143 135 - 145 mmol/L   Potassium 3.6 3.5 - 5.1 mmol/L   Chloride 111 101 - 111 mmol/L   CO2 25 22 - 32 mmol/L   Glucose, Bld 97 65 - 99 mg/dL   BUN 16 6 - 20 mg/dL   Creatinine, Ser 0.74 0.44 - 1.00 mg/dL   Calcium 8.5 (L) 8.9 - 10.3 mg/dL   GFR calc non Af Amer >60 >60 mL/min   GFR calc Af Amer >60 >60 mL/min     Comment: (NOTE) The eGFR has been calculated using the CKD EPI equation. This calculation has not been validated in all clinical situations. eGFR's persistently <60 mL/min signify possible Chronic Kidney Disease.    Anion gap 7 5 - 15  Pregnancy, urine     Status: None   Collection Time: 06/20/17  7:45 AM  Result Value Ref Range   Preg Test, Ur NEGATIVE NEGATIVE  CBC     Status: None   Collection Time: 06/21/17  4:11 AM  Result Value Ref Range   WBC 9.7 3.6 - 11.0 K/uL   RBC 3.87 3.80 - 5.20 MIL/uL   Hemoglobin 12.1 12.0 - 16.0 g/dL   HCT 35.6 35.0 - 47.0 %   MCV 91.8 80.0 - 100.0 fL   MCH 31.2 26.0 - 34.0 pg   MCHC 34.0 32.0 - 36.0 g/dL   RDW 13.0 11.5 - 14.5 %   Platelets 158 150 - 440 K/uL  Basic metabolic panel     Status: Abnormal   Collection Time: 06/21/17  4:11 AM  Result Value Ref Range   Sodium 141 135 - 145 mmol/L   Potassium 3.9 3.5 - 5.1 mmol/L   Chloride 110 101 - 111 mmol/L   CO2 25 22 - 32 mmol/L   Glucose, Bld 116 (H) 65 - 99 mg/dL   BUN 10 6 - 20 mg/dL   Creatinine, Ser 0.67 0.44 - 1.00 mg/dL   Calcium 8.1 (L) 8.9 - 10.3 mg/dL   GFR calc non Af Amer >60 >60 mL/min   GFR calc Af Amer >60 >60 mL/min    Comment: (NOTE) The eGFR has been calculated using the CKD EPI equation. This calculation has not been validated in all clinical situations. eGFR's persistently <60 mL/min signify possible Chronic Kidney Disease.    Anion gap 6 5 - 15  CBC     Status: None   Collection Time: 06/22/17  4:26 AM  Result Value Ref Range   WBC 10.0 3.6 - 11.0 K/uL   RBC 4.07 3.80 - 5.20 MIL/uL   Hemoglobin 12.5 12.0 - 16.0 g/dL   HCT 37.5 35.0 - 47.0 %   MCV 92.0 80.0 - 100.0 fL   MCH 30.7 26.0 - 34.0 pg   MCHC 33.3 32.0 - 36.0 g/dL  RDW 13.1 11.5 - 14.5 %   Platelets 164 150 - 440 K/uL  Basic metabolic panel     Status: Abnormal   Collection Time: 06/22/17  4:26 AM  Result Value Ref Range   Sodium 138 135 - 145 mmol/L   Potassium 3.4 (L) 3.5 - 5.1 mmol/L    Chloride 105 101 - 111 mmol/L   CO2 26 22 - 32 mmol/L   Glucose, Bld 100 (H) 65 - 99 mg/dL   BUN 10 6 - 20 mg/dL   Creatinine, Ser 0.72 0.44 - 1.00 mg/dL   Calcium 8.5 (L) 8.9 - 10.3 mg/dL   GFR calc non Af Amer >60 >60 mL/min   GFR calc Af Amer >60 >60 mL/min    Comment: (NOTE) The eGFR has been calculated using the CKD EPI equation. This calculation has not been validated in all clinical situations. eGFR's persistently <60 mL/min signify possible Chronic Kidney Disease.    Anion gap 7 5 - 15  CBC     Status: None   Collection Time: 06/23/17  5:21 AM  Result Value Ref Range   WBC 8.8 3.6 - 11.0 K/uL   RBC 4.16 3.80 - 5.20 MIL/uL   Hemoglobin 12.9 12.0 - 16.0 g/dL   HCT 37.8 35.0 - 47.0 %   MCV 90.8 80.0 - 100.0 fL   MCH 31.1 26.0 - 34.0 pg   MCHC 34.2 32.0 - 36.0 g/dL   RDW 13.3 11.5 - 14.5 %   Platelets 184 150 - 440 K/uL    Assessment/Plan:  Closed right ankle fracture Her recent injury and surgery were likely the primary risk factor for her developing the DVT.  Heterozygous MTHFR mutation C677T [facility]) May ultimately benefit from hematology evaluation to determine the full duration of anticoagulation.  DVT of popliteal vein (HCC) Her duplex demonstrates acute DVT in the right popliteal vein into the distal thigh/femoral vein.  The calf veins could not be evaluated secondary to her cast.  No proximal common femoral vein or iliac vein DVT was identified. I had a long discussion with she and her mother to her mother is very familiar with clotting issues.  This was clearly a provoked DVT with a recent injury, recent surgery, and mobility in a cast.  I have started her on Eliquis today and she will start on 10 mg twice daily and I have given her samples for that.  I have written her a prescription for 5 mg twice daily of Eliquis to start in 1 week.  This will need to be continued for at least 3 months and possibly longer.  Given the potential genetic issues, consideration for  referral to a hematologist can also be given.  Once she is out of the cast, she should use a compression stocking and elevate her leg.  Increasing her activity as tolerated would also be recommended.  I will recheck an ultrasound in 2-3 months.      Leotis Pain 07/13/2017, 2:29 PM   This note was created with Dragon medical transcription system.  Any errors from dictation are unintentional.

## 2017-07-13 NOTE — Assessment & Plan Note (Signed)
May ultimately benefit from hematology evaluation to determine the full duration of anticoagulation.

## 2017-07-13 NOTE — Assessment & Plan Note (Signed)
Her recent injury and surgery were likely the primary risk factor for her developing the DVT.

## 2017-07-13 NOTE — Telephone Encounter (Signed)
Pt   Reports   Went  To   pcp   Today  For  r   Leg  Pain      Was   Sent for  Doppler   And   Was  Found to have  A  Blood  Clot in  r  Leg .  She  Was   Placed  On  Eliquis .Later  Today  The pt reports some  Chest pain  And   Tightness  In  Chest  As   Well  As  The  Leg  Pain .  Dr  Jeananne Rama office  Notified  No   Appointment available  Send  To  [facility]    Reason for Disposition . Chest pain  Answer Assessment - Initial Assessment Questions 1. ONSET: "When did the pain start?"       4  Days  ago 2. LOCATION: "Where is the pain located?"       Behind knee   And   r  Calf   Radiates    Upward   3. PAIN: "How bad is the pain?"    (Scale 1-10; or mild, moderate, severe)   -  MILD (1-3): doesn't interfere with normal activities    -  MODERATE (4-7): interferes with normal activities (e.g., work or school) or awakens from sleep, limping    -  SEVERE (8-10): excruciating pain, unable to do any normal activities, unable to walk      9  4. WORK OR EXERCISE: "Has there been any recent work or exercise that involved this part of the body?"       NO 5. CAUSE: "What do you think is causing the leg pain?"      BLOOD  CLOT 6. OTHER SYMPTOMS: "Do you have any other symptoms?" (e.g., chest pain, back pain, breathing difficulty, swelling, rash, fever, numbness, weakness)     Shortness of that started about  1  Hour  ago 7. PREGNANCY: "Is there any chance you are pregnant?" "When was your last menstrual period?"     Depo  Shot  Answer Assessment - Initial Assessment Questions 1. LOCATION: "Where does it hurt?"       Chest  2. RADIATION: "Does the pain go anywhere else?" (e.g., into neck, jaw, arms, back)     no 3. ONSET: "When did the chest pain begin?" (Minutes, hours or days)      This afternoon 4. PATTERN "Does the pain come and go, or has it been constant since it started?"  "Does it get worse with exertion?"      Constant  5. DURATION: "How long does it last" (e.g., seconds, minutes, hours)  5 6. SEVERITY: "How bad is the pain?"  (e.g., Scale 1-10; mild, moderate, or severe)    - MILD (1-3): doesn't interfere with normal activities     - MODERATE (4-7): interferes with normal activities or awakens from sleep    - SEVERE (8-10): excruciating pain, unable to do any normal activities       *No Answer* 7. CARDIAC RISK FACTORS: "Do you have any history of heart problems or risk factors for heart disease?" (e.g., prior heart attack, angina; high blood pressure, diabetes, being overweight, high cholesterol, smoking, or strong family history of heart disease)      dvt  r leg   8. PULMONARY RISK FACTORS: "Do you have any history of lung disease?"  (e.g., blood clots in lung, asthma, emphysema, birth control  pills)     dvt  r leg 9. CAUSE: "What do you think is causing the chest pain?"      Possible  Blood  Clot  10. OTHER SYMPTOMS: "Do you have any other symptoms?" (e.g., dizziness, nausea, vomiting, sweating, fever, difficulty breathing, cough)       Tightness  In chest 11. PREGNANCY: "Is there any chance you are pregnant?" "When was your last menstrual period?"       Depo  Protocols used: LEG PAIN-A-AH, CHEST PAIN-A-AH

## 2017-07-13 NOTE — Progress Notes (Signed)
BP 95/61 (BP Location: Right Arm, Patient Position: Sitting, Cuff Size: Normal)   Pulse (!) 101   Temp 98.3 F (36.8 C) (Oral)   SpO2 98%    Subjective:    Patient ID: Ellen Jackson, female    DOB: 1990/11/19, 26 y.o.   MRN: [id number]  HPI: Ellen Jackson is a 26 y.o. female  Chief Complaint  Patient presents with  . Leg Pain    Extreme pain in leg behind knee and calf. Radiates down. Had foot surgery 06/20/17. Pain behind leg started 3 days ago.   Patient presents today with 3 day hx of worsening pain behind right knee radiating down calf. Denies redness, swelling, rashes in the area. Does have a below the knee fiberglass cast on currently from recent ankle surgery 3 weeks ago. Denies CP, SOB, palpitations. Risk factors include cigarette smoking, recent surgery, and sedentary state since surgery (pt in a wheelchair for now). Mother does have a genetic condition that causes blood clots, but pt has been tested and is negative for this. Pt very concerned about a blood clot today.   Relevant past medical, surgical, family and social history reviewed and updated as indicated. Interim medical history since our last visit reviewed. Allergies and medications reviewed and updated.  Review of Systems  Constitutional: Negative.   HENT: Negative.   Respiratory: Negative.   Cardiovascular: Negative.   Gastrointestinal: Negative.   Genitourinary: Negative.   Musculoskeletal:       Right leg pain  Neurological: Negative.   Psychiatric/Behavioral: Negative.    Per HPI unless specifically indicated above     Objective:    BP 95/61 (BP Location: Right Arm, Patient Position: Sitting, Cuff Size: Normal)   Pulse (!) 101   Temp 98.3 F (36.8 C) (Oral)   SpO2 98%   Wt Readings from Last 3 Encounters:  06/19/17 189 lb (85.7 kg)  06/03/17 183 lb (83 kg)  05/28/17 183 lb (83 kg)    Physical Exam  Constitutional: She is oriented to person, place, and time. She appears well-developed and  well-nourished. No distress.  HENT:  Head: Atraumatic.  Eyes: Conjunctivae are normal. Pupils are equal, round, and reactive to light. No scleral icterus.  Neck: Normal range of motion. Neck supple.  Cardiovascular: Normal rate, regular rhythm and normal heart sounds.  Pulmonary/Chest: Breath sounds normal. No respiratory distress.  Musculoskeletal: She exhibits tenderness (posterior right knee and upper calf mildly ttp).  In wheelchair today Difficult to assess edema given fiberglass cast, but no evidence of edema above cast  Neurological: She is alert and oriented to person, place, and time.  Skin: Skin is warm and dry. No erythema (none visualized above cast in area of patient's pain).  Psychiatric: She has a normal mood and affect. Her behavior is normal.  Nursing note and vitals reviewed.     Assessment & Plan:   Problem List Items Addressed This Visit    None    Visit Diagnoses    Pain of right lower leg    -  Primary   Will get STAT DVT u/s to r/o a clot. If no clot visualized, f/u with surgeon about new pain symptoms. Strict return precautions reviewed prior to imaging appt   Relevant Orders   VAS [place] LOWER EXTREMITY VENOUS (DVT)   Ambulatory referral to Vascular Surgery   Encounter for surveillance of injectable contraceptive       Pt due for her depo injection but wanting to hold off  for now until this is all figured out. Will reassess getting injection after imaging is obtained.     Pt aware if she becomes SOB, experiences CP or palpitations, or has severe worsening leg pain, swelling, or redness, to go to the ER if prior to her imaging study. Will call her once scheduled to let her know when appt is for that.    Follow up plan: Return if symptoms worsen or fail to improve.

## 2017-07-13 NOTE — Patient Instructions (Signed)
Deep Vein Thrombosis A deep vein thrombosis (DVT) is a blood clot (thrombus) that usually occurs in a deep, larger vein of the lower leg or the pelvis, or in an upper extremity such as the arm. These are dangerous and can lead to serious and even life-threatening complications if the clot travels to the lungs. A DVT can damage the valves in your leg veins so that instead of flowing upward, the blood pools in the lower leg. This is called post-thrombotic syndrome, and it can result in pain, swelling, discoloration, and sores on the leg. What are the causes? A DVT is caused by the formation of a blood clot in your leg, pelvis, or arm. Usually, several things contribute to the formation of blood clots. A clot may develop when:  Your blood flow slows down.  Your vein becomes damaged in some way.  You have a condition that makes your blood clot more easily.  What increases the risk? A DVT is more likely to develop in:  People who are older, especially over 60 years of age.  People who are overweight (obese).  People who sit or lie still for a long time, such as during long-distance travel (over 4 hours), bed rest, hospitalization, or during recovery from certain medical conditions like a stroke.  People who do not engage in much physical activity (sedentary lifestyle).  People who have chronic breathing disorders.  People who have a personal or family history of blood clots or blood clotting disease.  People who have peripheral vascular disease (PVD), diabetes, or some types of cancer.  People who have heart disease, especially if the person had a recent heart attack or has congestive heart failure.  People who have neurological diseases that affect the legs (leg paresis).  People who have had a traumatic injury, such as breaking a hip or leg.  People who have recently had major or lengthy surgery, especially on the hip, knee, or abdomen.  People who have had a central line placed  inside a large vein.  People who take medicines that contain the hormone estrogen. These include birth control pills and hormone replacement therapy.  Pregnancy or during childbirth or the postpartum period.  Long plane flights (over 8 hours).  What are the signs or symptoms?  Symptoms of a DVT can include:  Swelling of your leg or arm, especially if one side is much worse.  Warmth and redness of your leg or arm, especially if one side is much worse.  Pain in your arm or leg. If the clot is in your leg, symptoms may be more noticeable or worse when you stand or walk.  A feeling of pins and needles, if the clot is in the arm.  The symptoms of a DVT that has traveled to the lungs (pulmonary embolism, PE) usually start suddenly and include:  Shortness of breath while active or at rest.  Coughing or coughing up blood or blood-tinged mucus.  Chest pain that is often worse with deep breaths.  Rapid or irregular heartbeat.  Feeling light-headed or dizzy.  Fainting.  Feeling anxious.  Sweating.  There may also be pain and swelling in a leg if that is where the blood clot started. These symptoms may represent a serious problem that is an emergency. Do not wait to see if the symptoms will go away. Get medical help right away. Call your local emergency services (911 in the [place] Do not drive yourself to the hospital. How is this diagnosed? Your health   care provider will take a medical history and perform a physical exam. You may also have other tests, including:  Blood tests to assess the clotting properties of your blood.  Imaging tests, such as CT, ultrasound, MRI, X-ray, and other tests to see if you have clots anywhere in your body.  How is this treated? After a DVT is identified, it can be treated. The type of treatment that you receive depends on many factors, such as the cause of your DVT, your risk for bleeding or developing more clots, and other medical conditions that  you have. Sometimes, a combination of treatments is necessary. Treatment options may be combined and include:  Monitoring the blood clot with ultrasound.  Taking medicines by mouth, such as newer blood thinners (anticoagulants), thrombolytics, or warfarin.  Taking anticoagulant medicine by injection or through an IV tube.  Wearing compression stockings or using different types ofdevices.  Surgery (rare) to remove the blood clot or to place a filter in your abdomen to stop the blood clot from traveling to your lungs.  Treatments for a DVT are often divided into immediate treatment and long-term treatment (up to 3 months after DVT). You can work with your health care provider to choose the treatment program that is best for you. Follow these instructions at home: If you are taking a newer oral anticoagulant:  Take the medicine every single day at the same time each day.  Understand what foods and drugs interact with this medicine.  Understand that there are no regular blood tests required when using this medicine.  Understand the side effects of this medicine, including excessive bruising or bleeding. Ask your health care provider or pharmacist about other possible side effects. If you are taking warfarin:  Understand how to take warfarin and know which foods can affect how warfarin works in your body.  Understand that it is dangerous to take too much or too little warfarin. Too much warfarin increases the risk of bleeding. Too little warfarin continues to allow the risk for blood clots.  Follow your PT and INR blood testing schedule. The PT and INR results allow your health care provider to adjust your dose of warfarin. It is very important that you have your PT and INR tested as often as told by your health care provider.  Avoid major changes in your diet, or tell your health care provider before you change your diet. Arrange a visit with a registered dietitian to answer your  questions. Many foods, especially foods that are high in vitamin K, can interfere with warfarin and affect the PT and INR results. Eat a consistent amount of foods that are high in vitamin K, such as: ? Spinach, kale, broccoli, cabbage, collard greens, turnip greens, Brussels sprouts, peas, cauliflower, seaweed, and parsley. ? Beef liver and pork liver. ? Green tea. ? Soybean oil.  Tell your health care provider about any and all medicines, vitamins, and supplements that you take, including aspirin and other over-the-counter anti-inflammatory medicines. Be especially cautious with aspirin and anti-inflammatory medicines. Do not take those before you ask your health care provider if it is safe to do so. This is important because many medicines can interfere with warfarin and affect the PT and INR results.  Do not start or stop taking any over-the-counter or prescription medicine unless your health care provider or pharmacist tells you to do so. If you take warfarin, you will also need to do these things:  Hold pressure over cuts for longer than   usual.  Tell your dentist and other health care providers that you are taking warfarin before you have any procedures in which bleeding may occur.  Avoid alcohol or drink very small amounts. Tell your health care provider if you change your alcohol intake.  Do not use tobacco products, including cigarettes, chewing tobacco, and e-cigarettes. If you need help quitting, ask your health care provider.  Avoid contact sports.  General instructions  Take over-the-counter and prescription medicines only as told by your health care provider. Anticoagulant medicines can have side effects, including easy bruising and difficulty stopping bleeding. If you are prescribed an anticoagulant, you will also need to do these things: ? Hold pressure over cuts for longer than usual. ? Tell your dentist and other health care providers that you are taking anticoagulants  before you have any procedures in which bleeding may occur. ? Avoid contact sports.  Wear a medical alert bracelet or carry a medical alert card that says you have had a PE.  Ask your health care provider how soon you can go back to your normal activities. Stay active to prevent new blood clots from forming.  Make sure to exercise while traveling or when you have been sitting or standing for a long period of time. It is very important to exercise. Exercise your legs by walking or by tightening and relaxing your leg muscles often. Take frequent walks.  Wear compression stockings as told by your health care provider to help prevent more blood clots from forming.  Do not use tobacco products, including cigarettes, chewing tobacco, and e-cigarettes. If you need help quitting, ask your health care provider.  Keep all follow-up appointments with your health care provider. This is important. How is this prevented? Take these actions to decrease your risk of developing another DVT:  Exercise regularly. For at least 30 minutes every day, engage in: ? Activity that involves moving your arms and legs. ? Activity that encourages good blood flow through your body by increasing your heart rate.  Exercise your arms and legs every hour during long-distance travel (over 4 hours). Drink plenty of water and avoid drinking alcohol while traveling.  Avoid sitting or lying in bed for long periods of time without moving your legs.  Maintain a weight that is appropriate for your height. Ask your health care provider what weight is healthy for you.  If you are a woman who is over 35 years of age, avoid unnecessary use of medicines that contain estrogen. These include birth control pills.  Do not smoke, especially if you take estrogen medicines. If you need help quitting, ask your health care provider.  If you are hospitalized, prevention measures may include:  Early walking after surgery, as soon as your  health care provider says that it is safe.  Receiving anticoagulants to prevent blood clots.If you cannot take anticoagulants, other options may be available, such as wearing compression stockings or using different types of devices.  Get help right away if:  You have new or increased pain, swelling, or redness in an arm or leg.  You have numbness or tingling in an arm or leg.  You have shortness of breath while active or at rest.  You have chest pain.  You have a rapid or irregular heartbeat.  You feel light-headed or dizzy.  You cough up blood.  You notice blood in your vomit, bowel movement, or urine. These symptoms may represent a serious problem that is an emergency. Do not wait to see   if the symptoms will go away. Get medical help right away. Call your local emergency services (911 in the [place] Do not drive yourself to the hospital. This information is not intended to replace advice given to you by your health care provider. Make sure you discuss any questions you have with your health care provider. Document Released: 08/10/2005 Document Revised: 01/16/2016 Document Reviewed: 12/05/2014 [employer] Interactive Patient Education  2017 [employer]  

## 2017-07-13 NOTE — Patient Instructions (Signed)
Follow up as needed

## 2017-07-14 ENCOUNTER — Ambulatory Visit: Payer: Medicaid Other

## 2017-07-17 ENCOUNTER — Emergency Department
Admission: EM | Admit: 2017-07-17 | Discharge: 2017-07-17 | Disposition: A | Discharge status: Home / Self Care | Payer: Medicaid Other | Attending: Emergency Medicine | Admitting: Emergency Medicine

## 2017-07-17 ENCOUNTER — Other Ambulatory Visit: Payer: Self-pay

## 2017-07-17 ENCOUNTER — Emergency Department: Payer: Medicaid Other

## 2017-07-17 DIAGNOSIS — R0602 Shortness of breath: Secondary | ICD-10-CM | POA: Insufficient documentation

## 2017-07-17 DIAGNOSIS — I2699 Other pulmonary embolism without acute cor pulmonale: Secondary | ICD-10-CM | POA: Insufficient documentation

## 2017-07-17 DIAGNOSIS — F17210 Nicotine dependence, cigarettes, uncomplicated: Secondary | ICD-10-CM | POA: Diagnosis not present

## 2017-07-17 DIAGNOSIS — Z79899 Other long term (current) drug therapy: Secondary | ICD-10-CM | POA: Diagnosis not present

## 2017-07-17 DIAGNOSIS — R079 Chest pain, unspecified: Secondary | ICD-10-CM | POA: Diagnosis present

## 2017-07-17 HISTORY — DX: Acute embolism and thrombosis of unspecified deep veins of unspecified lower extremity: I82.409

## 2017-07-17 LAB — CBC
HCT: 41.1 % (ref 35.0–47.0)
HEMOGLOBIN: 14 g/dL (ref 12.0–16.0)
MCH: 30.8 pg (ref 26.0–34.0)
MCHC: 34 g/dL (ref 32.0–36.0)
MCV: 90.7 fL (ref 80.0–100.0)
PLATELETS: 213 10*3/uL (ref 150–440)
RBC: 4.53 MIL/uL (ref 3.80–5.20)
RDW: 12.8 % (ref 11.5–14.5)
WBC: 7.7 10*3/uL (ref 3.6–11.0)

## 2017-07-17 LAB — BASIC METABOLIC PANEL
ANION GAP: 10 (ref 5–15)
BUN: 10 mg/dL (ref 6–20)
CALCIUM: 9.1 mg/dL (ref 8.9–10.3)
CO2: 22 mmol/L (ref 22–32)
CREATININE: 0.68 mg/dL (ref 0.44–1.00)
Chloride: 106 mmol/L (ref 101–111)
Glucose, Bld: 90 mg/dL (ref 65–99)
Potassium: 3.2 mmol/L — ABNORMAL LOW (ref 3.5–5.1)
SODIUM: 138 mmol/L (ref 135–145)

## 2017-07-17 LAB — TROPONIN I

## 2017-07-17 LAB — BRAIN NATRIURETIC PEPTIDE: B NATRIURETIC PEPTIDE 5: 30 pg/mL (ref 0.0–100.0)

## 2017-07-17 MED ORDER — OXYCODONE HCL 5 MG PO TABS: 5.0000 mg | ORAL_TABLET | Freq: Three times a day (TID) | ORAL | 0 refills | Status: DC | PRN

## 2017-07-17 MED ORDER — IOPAMIDOL (ISOVUE-370) INJECTION 76%
75.0000 mL | Freq: Once | INTRAVENOUS | Status: AC | PRN
  Administered 2017-07-17: 75 mL via INTRAVENOUS

## 2017-07-17 NOTE — Discharge Instructions (Signed)
Please follow-up with your doctor on Monday for recheck/reevaluation.  Take your pain medication as needed, as written.  Please continue to take your blood thinner as prescribed by your doctor.  Return to the emergency department for any worsening chest pain, any worsening trouble breathing, or any other symptom personally concerning to yourself.

## 2017-07-17 NOTE — ED Notes (Signed)
Md Notified of Patient's discomfort. No New Orders Rcvd

## 2017-07-17 NOTE — ED Triage Notes (Signed)
Pt states she was dx with a DVT in the RLE and rx elaquist . Pt c/o substernal chest pain and SOB over night and into today.the patient is in NAD at present, skin is warm and dry, respirations WNL

## 2017-07-17 NOTE — ED Provider Notes (Signed)
[facility] Emergency Department Provider Note  Time seen: 12:12 PM  I have reviewed the triage vital signs and the nursing notes.   HISTORY  Chief Complaint Chest Pain    HPI Ellen Jackson is a 26 y.o. female with a past medical history of fibromyalgia, IBS, migraines, right ankle fracture status post surgery 1 month ago, confirmed DVT 4 days ago now on Eliquis who presents to the emergency department with chest pain and shortness of breath.  According to the patient last night she developed mild shortness of breath, she states this morning the shortness of breath continued along with central chest discomfort which she states feels more of a pressure in the center of her chest.  Does states somewhat worse with deep inspiration.  Denies any cough.  States occasional nausea but also states this is chronic for her.  Denies diaphoresis.  Denies history of PE in the past.  Patient does smoke cigarettes.  Patient was on progesterone only birth control however she was due for her next dose/injection 07/14/17 which she has not gotten.   Past Medical History:  Diagnosis Date  . Allergy   . Anxiety   . DVT (deep venous thrombosis) ([place]) 07/13/2017   RLE  . Dysrhythmia   . Fibromyalgia    everywhere  . GERD (gastroesophageal reflux disease)   . Hernia, umbilical   . Heterozygous MTHFR mutation C677T ([place])   . IBS (irritable bowel syndrome)   . Migraine with aura    bc powders is only thing that works.  . Ovarian cyst   . Spina bifida occulta   . SVT (supraventricular tachycardia) [facility])     Patient Active Problem List   Diagnosis Date Noted  . DVT of popliteal vein ([place]) 07/13/2017  . Ankle fracture 06/20/2017  . Closed right ankle fracture 06/19/2017  . S/P umbilical hernia repair, follow-up exam 10/19/2016  . Exertional dyspnea 10/14/2016  . Recurrent umbilical hernia 22/09/5425  . Fibromyalgia 07/30/2016  . Anxiety 02/05/2015  . Headache, migraine  02/05/2015  . Clinical depression 02/05/2015  . Heterozygous MTHFR mutation C677T ([place]) 02/05/2015  . 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 Paroxysmal supraventricular tachycardia ([place]) 02/05/2015  . LLQ abdominal pain 02/05/2015  . Anxiety disorder 02/05/2015  . Major depressive disorder, single episode 02/05/2015  . Gonalgia 10/16/2014  . Pain in joint involving lower leg 10/16/2014    Past Surgical History:  Procedure Laterality Date  . APPENDECTOMY  2015  . CESAREAN SECTION  2011  . CHOLECYSTECTOMY  2005  . HERNIA REPAIR  0623   Umbilical Hernia Repair with mesh  . KNEE ARTHROSCOPY Left 2015   X 3. plate and pin in left shin placed after several surgeries. last surgery for this was 20154  . ORIF ANKLE FRACTURE Right 06/20/2017   Procedure: OPEN REDUCTION INTERNAL FIXATION (ORIF) TRIMALLEOLAR ANKLE FRACTURE LATERAL LIGAMENT RECONSTRUCTION;  Surgeon: Thornton Park, MD;  Location: [facility] ORS;  Service: Orthopedics;  Laterality: Right;  . OVARIAN CYST SURGERY    . UMBILICAL HERNIA REPAIR N/A 10/09/2016   Procedure: HERNIA REPAIR UMBILICAL ADULT;  Surgeon: Olean Ree, MD;  Location: [facility] ORS;  Service: General;  Laterality: N/A;    Prior to Admission medications   Medication Sig Start Date End Date Taking? Authorizing Provider  albuterol (PROVENTIL HFA;VENTOLIN HFA) 108 (90 Base) MCG/ACT inhaler Inhale 2 puffs into the lungs every 6 (six) hours as needed for wheezing or shortness of breath. 11/17/16   Wynetta Emery, Megan P, DO  albuterol (  PROVENTIL) (2.5 MG/3ML) 0.083% nebulizer solution Take 3 mLs (2.5 mg total) by nebulization every 6 (six) hours as needed for wheezing or shortness of breath. 06/23/17   Thornton Park, MD  aspirin EC 325 MG tablet Take 1 tablet (325 mg total) by mouth daily. Patient not taking: Reported on 07/13/2017 06/23/17   Thornton Park, MD  cyclobenzaprine (FLEXERIL) 10 MG tablet Take 1 tablet (10 mg total) by mouth 3 (three) times daily as needed for muscle  spasms. Patient not taking: Reported on 03/09/2017 10/16/16   Jules Husbands, MD  LORazepam (ATIVAN) 0.5 MG tablet Take by mouth.    [provider]  LYRICA 150 MG capsule TAKE ONE CAPSULE BY MOUTH TWICE A DAY 05/24/17   Kathrine Haddock, NP  medroxyPROGESTERone (DEPO-PROVERA) 150 MG/ML injection Inject 150 mg into the muscle every 3 (three) months. Next injection due 12-2016    [provider]  meloxicam (MOBIC) 15 MG tablet meloxicam 15 mg tablet  Take 1 tablet every day by oral route.    [provider]  metoprolol succinate (TOPROL XL) 25 MG 24 hr tablet Take 1.5 tablets (37.5 mg total) by mouth daily. 04/13/17   Johnson, Megan P, DO  oxyCODONE (OXY IR/ROXICODONE) 5 MG immediate release tablet oxycodone 5 mg tablet  TK ONE C PO Q 6 H PRF SEVERE PAIN    [provider]  polyethylene glycol (MIRALAX / GLYCOLAX) packet Take 17 g by mouth 2 (two) times daily as needed for mild constipation.     [provider]  pregabalin (LYRICA) 75 MG capsule Take by mouth. 11/24/16   [provider]  ranitidine (ZANTAC) 150 MG tablet Take 1 tablet (150 mg total) by mouth daily as needed for heartburn. 07/02/16   Johnson, Megan P, DO  SUMAtriptan (IMITREX) 100 MG tablet sumatriptan 100 mg tablet    [provider]  triamcinolone cream (KENALOG) 0.1 % APP EXT AA BID 05/28/17   [provider]  venlafaxine XR (EFFEXOR XR) 37.5 MG 24 hr capsule Take 1 capsule daily with breakfast for 7 days, then increase to 2 capsules daily with breakfast. Patient taking differently: 37.5 mg 2 (two) times daily.  02/01/17   Pieter Partridge, DO    Allergies  Allergen Reactions  . Codeine     Other reaction(s): Other (See Comments) Passes out  . Latex Itching and Rash    Lips itching  . Morphine Itching and Shortness Of Breath  . Nalbuphine Itching and Shortness Of Breath  . Ondansetron Hcl Hives and Rash  . Other Hives    Allergic to steri strips. tegaderm ok..  Paper tape is ok. Clear plastic tape rips off skin  . Penicillins Rash    Patient reports severe rash and itching. Has patient had a PCN reaction causing immediate rash, facial/tongue/throat swelling, SOB or lightheadedness with hypotension: Yes Has patient had a PCN reaction causing severe rash involving mucus membranes or skin necrosis: No Has patient had a PCN reaction that required hospitalization unknown Has patient had a PCN reaction occurring within the last 10 years:  unknown If all of the above answers are "NO", then may proceed  . Codeine Sulfate Other (See Comments)    fainting  . Metoclopramide Anxiety    Other reaction(s): Other (See Comments) "jittery" Difficulty breathing  . Prochlorperazine Anxiety and Other (See Comments)    Difficulty breathing  . Prochlorperazine Edisylate Anxiety  . Promethazine Anxiety and Other (See Comments)    "jittery" Difficulty breathing     .  Tape     See above ... Paper tape ok  . Tizanidine Other (See Comments)    Worsens headache    Family History  Problem Relation Age of Onset  . Arthritis Mother   . Hyperlipidemia Mother   . Hypertension Mother   . Migraines Mother   . Arthritis Father   . Autism Son   . Cancer Maternal Grandfather        pancreatic  . Stroke Maternal Grandfather   . Diabetes Paternal Grandmother     Social History Social History   Tobacco Use  . Smoking status: Current Every Day Smoker    Packs/day: 0.50    Years: 3.00    Pack years: 1.50    Types: Cigarettes  . Smokeless tobacco: Never Used  Substance Use Topics  . Alcohol use: No    Alcohol/week: 0.0 oz  . Drug use: No    Review of Systems Constitutional: Negative for fever. Cardiovascular: Central chest heaviness. Respiratory: Mild shortness of breath Gastrointestinal: Negative for abdominal pain Musculoskeletal: Patient states increased right leg pain of the past 3 days. Neurological: Negative for headache All other ROS  negative  ____________________________________________   PHYSICAL EXAM:  VITAL SIGNS: ED Triage Vitals [07/17/17 1142]  Enc Vitals Group     BP 124/76     Pulse Rate 94     Resp 17     Temp 97.7 F (36.5 C)     Temp Source Oral     SpO2 100 %     Weight 180 lb (81.6 kg)     Height 5\' 7"  (1.702 m)     Head Circumference      Peak Flow      Pain Score 8     Pain Loc      Pain Edu?      Excl. in [place]?     Constitutional: Alert and oriented. Well appearing and in no distress. Eyes: Normal exam ENT   Head: Normocephalic and atraumatic.   Mouth/Throat: Mucous membranes are moist. Cardiovascular: Normal rate, regular rhythm. No murmur Respiratory: Normal respiratory effort without tachypnea nor retractions. Breath sounds are clear  Gastrointestinal: Soft and nontender. No distention.   Musculoskeletal: Patient has a hard cast distal to the knee and the right lower extremity.  I am able to place 1-2 fingers within the cast all the way around the calf.  Calf appears soft.  Patient states pain in the popliteal fossa and distal with tenderness to this area. Neurologic:  Normal speech and language. No gross focal neurologic deficits Skin:  Skin is warm, dry and intact.  Psychiatric: Mood and affect are normal.   ____________________________________________    EKG  EKG reviewed and interpreted by myself shows normal sinus rhythm at 86 bpm, narrow QRS, normal axis, normal intervals, no concerning ST changes.  ____________________________________________    RADIOLOGY  IMPRESSION: 1. Acute pulmonary embolus involving right upper lobar and segmental pulmonary arteries. No findings of right heart strain. 2. Stable 11 mm left lobe of thyroid nodule. Thyroid ultrasound is recommended on a nonemergent basis. Critical Value/emergent results were called by telephone at the time of interpretation on 07/17/2017 at 1:28 pm to Dr. Harvest Dark , who verbally acknowledged  these results.  ____________________________________________   INITIAL IMPRESSION / ASSESSMENT AND PLAN / ED COURSE  Pertinent labs & imaging results that were available during my care of the patient were reviewed by me and considered in my medical decision making (see chart for details).  Patient presents to the emergency department for chest pain 4 days after starting Eliquis for known DVT.  Differential would include PE, ACS, chest wall pain.  Given the patient's confirmed DVT we will perform a CT angiography of the chest to rule out PE.  We will check labs including cardiac enzymes.  EKG is reassuring.  Vital signs are reassuring with a normal heart rate in the 80s during my examination with a 100% room air saturation and normal respiratory rate.  I reviewed the patient's records including surgery for her right ankle fracture 06/20/17 and confirmed DVT on 07/13/17.  Patient's CT is positive for small PE with no signs of right heart strain.  As the patient was on Eliquis for 3-4 days before the pain started I discussed the patient with critical care, to decide if this is failure of outpatient therapy or not.  Given the small size of PE with no right heart strain, normal vitals he recommended checking a BNP.  BNP was normal he recommended discharge home, a BNP was elevated he recommended admission.  I have added on a BNP  Patient's BNP is normal.  She remains hemodynamically stable with normal vitals.  We will discharge home with continued Eliquis.  We will add oxycodone for pain control.  Patient agreeable to this plan of care.  Patient will follow up with her doctor on Monday.  ____________________________________________   FINAL CLINICAL IMPRESSION(S) / ED DIAGNOSES  Pulmonary embolism    Harvest Dark, MD 07/17/17 1503

## 2017-07-21 ENCOUNTER — Encounter: Payer: Self-pay | Admitting: Family Medicine

## 2017-07-21 ENCOUNTER — Ambulatory Visit: Payer: Medicaid Other | Admitting: Family Medicine

## 2017-07-21 VITALS — BP 103/70 | HR 101 | Temp 98.1°F

## 2017-07-21 DIAGNOSIS — I82431 Acute embolism and thrombosis of right popliteal vein: Secondary | ICD-10-CM

## 2017-07-21 DIAGNOSIS — F419 Anxiety disorder, unspecified: Secondary | ICD-10-CM

## 2017-07-21 DIAGNOSIS — E041 Nontoxic single thyroid nodule: Secondary | ICD-10-CM | POA: Diagnosis not present

## 2017-07-21 DIAGNOSIS — I2699 Other pulmonary embolism without acute cor pulmonale: Secondary | ICD-10-CM | POA: Diagnosis not present

## 2017-07-21 MED ORDER — LORAZEPAM 0.5 MG PO TABS: 0.5000 mg | ORAL_TABLET | Freq: Two times a day (BID) | ORAL | 0 refills | Status: DC | PRN

## 2017-07-21 MED ORDER — VENLAFAXINE HCL ER 37.5 MG PO CP24: 37.5000 mg | ORAL_CAPSULE | Freq: Two times a day (BID) | ORAL | 5 refills | Status: DC

## 2017-07-21 NOTE — Progress Notes (Signed)
BP 103/70 (BP Location: Right Arm, Patient Position: Sitting, Cuff Size: Large)   Pulse (!) 101   Temp 98.1 F (36.7 C)   SpO2 100%    Subjective:    Patient ID: Ellen Jackson, female    DOB: 05/05/1991, 26 y.o.   MRN: [id number]  HPI: Ellen Jackson is a 26 y.o. female  Chief Complaint  Patient presents with  . Hospitalization Follow-up    Blood clots  . Thyroid Nodule   Patient presents today for ER f/u for small PE. RLE DVTdiscovered 4 days prior, with initiation of eliquis. Pt then developed chest tightness, SOB and went to ER. D/c'd in stable condition to continue eliquis, doing fairly well since just fatigued. Concerned now with a thyroid nodule that was an incidental finding on CT. Started noticing subtle changes in shape of thyroid gland about 6 months now and notes a "something in her throat" sensation with frequent clearing of her throat. Also notes sudden weight gain the past year that is unexplained. No sweats, fevers, constipation, diarrhea, or other similar sxs noted. Aunt has Graves dz but no other known fhx of thyroid dz.   Also states her anxiety is significantly worse since all of these medical events happened recently, starting with breaking her ankle and having surgery which led to DVT > PE and now thyroid nodule. Has been off her ativan for almost a year now and feeling like her effexor is no longer enough. She constantly worries something else is going to happen, multiple panic episodes daily about her health.   Past Medical History:  Diagnosis Date  . Allergy   . Anxiety   . DVT (deep venous thrombosis) ([place]) 07/13/2017   RLE  . Dysrhythmia   . Fibromyalgia    everywhere  . GERD (gastroesophageal reflux disease)   . Hernia, umbilical   . Heterozygous MTHFR mutation C677T ([place])   . IBS (irritable bowel syndrome)   . Migraine with aura    bc powders is only thing that works.  . Ovarian cyst   . Spina bifida occulta   . SVT (supraventricular tachycardia)  ([facility])    Social History   Socioeconomic History  . Marital status: Single    Spouse name: Not on file  . Number of children: Not on file  . Years of education: Not on file  . Highest education level: Not on file  Social Needs  . Financial resource strain: Not on file  . Food insecurity - worry: Not on file  . Food insecurity - inability: Not on file  . Transportation needs - medical: Not on file  . Transportation needs - non-medical: Not on file  Occupational History  . Not on file  Tobacco Use  . Smoking status: Current Every Day Smoker    Packs/day: 0.50    Years: 3.00    Pack years: 1.50    Types: Cigarettes  . Smokeless tobacco: Never Used  Substance and Sexual Activity  . Alcohol use: No    Alcohol/week: 0.0 oz  . Drug use: No  . Sexual activity: No  Other Topics Concern  . Not on file  Social History Narrative  . Not on file    Relevant past medical, surgical, family and social history reviewed and updated as indicated. Interim medical history since our last visit reviewed. Allergies and medications reviewed and updated.  Review of Systems  Constitutional: Positive for fatigue and unexpected weight change.  HENT: Negative.   Respiratory: Positive  for chest tightness.   Cardiovascular: Negative.   Gastrointestinal: Negative.   Endocrine:       Enlarged thyroid  Musculoskeletal: Negative.   Neurological: Negative.   Psychiatric/Behavioral: Positive for sleep disturbance. The patient is nervous/anxious.    Per HPI unless specifically indicated above     Objective:    BP 103/70 (BP Location: Right Arm, Patient Position: Sitting, Cuff Size: Large)   Pulse (!) 101   Temp 98.1 F (36.7 C)   SpO2 100%   Wt Readings from Last 3 Encounters:  07/17/17 180 lb (81.6 kg)  07/13/17 180 lb (81.6 kg)  06/19/17 189 lb (85.7 kg)    Physical Exam  Constitutional: She is oriented to person, place, and time. She appears well-developed and well-nourished. No  distress.  HENT:  Head: Atraumatic.  Eyes: Conjunctivae are normal. No scleral icterus.  Neck: Normal range of motion. Neck supple. Thyromegaly present.  Cardiovascular: Normal heart sounds.  Slightly tachycardic  Pulmonary/Chest: Effort normal and breath sounds normal. No respiratory distress.  Musculoskeletal: Normal range of motion.  Lymphadenopathy:    She has no cervical adenopathy.  Neurological: She is alert and oriented to person, place, and time.  Skin: Skin is warm and dry.  Psychiatric: She has a normal mood and affect. Her behavior is normal.  Nursing note and vitals reviewed.  Results for orders placed or performed during the hospital encounter of 40/98/11  Basic metabolic panel  Result Value Ref Range   Sodium 138 135 - 145 mmol/L   Potassium 3.2 (L) 3.5 - 5.1 mmol/L   Chloride 106 101 - 111 mmol/L   CO2 22 22 - 32 mmol/L   Glucose, Bld 90 65 - 99 mg/dL   BUN 10 6 - 20 mg/dL   Creatinine, Ser 0.68 0.44 - 1.00 mg/dL   Calcium 9.1 8.9 - 10.3 mg/dL   GFR calc non Af Amer >60 >60 mL/min   GFR calc Af Amer >60 >60 mL/min   Anion gap 10 5 - 15  CBC  Result Value Ref Range   WBC 7.7 3.6 - 11.0 K/uL   RBC 4.53 3.80 - 5.20 MIL/uL   Hemoglobin 14.0 12.0 - 16.0 g/dL   HCT 41.1 35.0 - 47.0 %   MCV 90.7 80.0 - 100.0 fL   MCH 30.8 26.0 - 34.0 pg   MCHC 34.0 32.0 - 36.0 g/dL   RDW 12.8 11.5 - 14.5 %   Platelets 213 150 - 440 K/uL  Troponin I  Result Value Ref Range   Troponin I <0.03 <0.03 ng/mL  Brain natriuretic peptide  Result Value Ref Range   B Natriuretic Peptide 30.0 0.0 - 100.0 pg/mL      Assessment & Plan:   Problem List Items Addressed This Visit      Cardiovascular and Mediastinum   DVT of popliteal vein ([facility])    Stable on eliquis without bleeding or bruisng issues. CBC stable in ER several days ago. Pain slowly improving in right upper calf. Continue eliquis per Vascular Surgery's instruction      Pulmonary embolism ([place])    Found on CT angio in  ER. Sxs slowly improving, continue eliquis. F/u with any returning sxs to ER        Other   Anxiety disorder    Given all of the major medical issues and her increased panic episodes, will restart ativan on prn basis until things settle for her. Precautions reviewed. Recheck in 1 month. Continue effexor. Recommended counseling  Relevant Medications   LORazepam (ATIVAN) 0.5 MG tablet   venlafaxine XR (EFFEXOR XR) 37.5 MG 24 hr capsule    Other Visit Diagnoses    Thyroid nodule    -  Primary   Given outward changes, 11 cm nodule, and sxs, will refer to Endocrinology for further imaging and lab eval.    Relevant Orders   Ambulatory referral to Endocrinology       Follow up plan: Return in about 4 weeks (around 08/18/2017) for Anxiety f/u.

## 2017-07-23 DIAGNOSIS — I2699 Other pulmonary embolism without acute cor pulmonale: Secondary | ICD-10-CM

## 2017-07-23 HISTORY — DX: Other pulmonary embolism without acute cor pulmonale: I26.99

## 2017-07-23 NOTE — Assessment & Plan Note (Signed)
Stable on eliquis without bleeding or bruisng issues. CBC stable in ER several days ago. Pain slowly improving in right upper calf. Continue eliquis per Vascular Surgery's instruction

## 2017-07-23 NOTE — Assessment & Plan Note (Signed)
Found on CT angio in ER. Sxs slowly improving, continue eliquis. F/u with any returning sxs to ER

## 2017-07-23 NOTE — Assessment & Plan Note (Addendum)
Given all of the major medical issues and her increased panic episodes, will restart ativan on prn basis until things settle for her. Precautions reviewed. Recheck in 1 month. Continue effexor. Recommended counseling

## 2017-07-23 NOTE — Patient Instructions (Signed)
Follow up in 1 month   

## 2017-07-26 ENCOUNTER — Other Ambulatory Visit: Payer: Self-pay | Admitting: Unknown Physician Specialty

## 2017-07-26 ENCOUNTER — Other Ambulatory Visit: Payer: Self-pay | Admitting: Family Medicine

## 2017-07-26 ENCOUNTER — Telehealth: Payer: Self-pay | Admitting: Unknown Physician Specialty

## 2017-07-26 MED ORDER — PREGABALIN 150 MG PO CAPS
150.0000 mg | ORAL_CAPSULE | Freq: Two times a day (BID) | ORAL | 1 refills | Status: DC
Start: 2017-07-26 — End: 2018-01-21

## 2017-07-26 NOTE — Telephone Encounter (Signed)
Patient needs her lyrica sent to her pharmacy if possible today as she has been out of the medication since for a few days.  Thanks

## 2017-07-26 NOTE — Telephone Encounter (Signed)
Your patient.  Thanks 

## 2017-07-27 NOTE — Telephone Encounter (Signed)
Copied from [place] [id number]. Topic: Inquiry >> Jul 27, 2017 11:45 AM Oliver Pila B wrote: Reason for CRM: pt called to say that the pharmacy is waiting to hear back from Dr. Wynetta Emery about the Lyrica, contact pt or pharmacy if needed

## 2017-07-27 NOTE — Telephone Encounter (Signed)
Dr.Johnson it looks like patient needs to try Cymbalta or gabapentin.

## 2017-07-27 NOTE — Telephone Encounter (Signed)
Newly diagnosed. Has taken amitriptyline and and gabapentin for migraines with poor side effects. Will start her on lyrica. Aware that there will be a PA.   See above from note 07/30/16- we will need to repeat PA. Cannot take cymbalta as she's on another SSRI and stable so it's contraindicated

## 2017-07-28 NOTE — Telephone Encounter (Signed)
Copied from [place] [id number]. Topic: Inquiry >> Jul 28, 2017  1:41 PM Burnis Medin, NT wrote: Pt. Called back about to see if they have gotten the authorization for her medication yet. Pt would like a call back.

## 2017-07-29 NOTE — Telephone Encounter (Signed)
Prior Authorization approved by [employer]

## 2017-08-03 ENCOUNTER — Encounter: Payer: Self-pay | Admitting: Emergency Medicine

## 2017-08-03 ENCOUNTER — Emergency Department
Admission: EM | Admit: 2017-08-03 | Discharge: 2017-08-03 | Disposition: A | Discharge status: Home / Self Care | Payer: Medicaid Other | Attending: Emergency Medicine | Admitting: Emergency Medicine

## 2017-08-03 ENCOUNTER — Emergency Department: Payer: Medicaid Other

## 2017-08-03 DIAGNOSIS — R0602 Shortness of breath: Secondary | ICD-10-CM | POA: Insufficient documentation

## 2017-08-03 DIAGNOSIS — M79604 Pain in right leg: Secondary | ICD-10-CM

## 2017-08-03 DIAGNOSIS — Z91040 Latex allergy status: Secondary | ICD-10-CM | POA: Insufficient documentation

## 2017-08-03 DIAGNOSIS — F17210 Nicotine dependence, cigarettes, uncomplicated: Secondary | ICD-10-CM | POA: Diagnosis not present

## 2017-08-03 DIAGNOSIS — Z86711 Personal history of pulmonary embolism: Secondary | ICD-10-CM | POA: Diagnosis not present

## 2017-08-03 DIAGNOSIS — Z86718 Personal history of other venous thrombosis and embolism: Secondary | ICD-10-CM | POA: Insufficient documentation

## 2017-08-03 DIAGNOSIS — Z79899 Other long term (current) drug therapy: Secondary | ICD-10-CM | POA: Diagnosis not present

## 2017-08-03 LAB — BASIC METABOLIC PANEL
ANION GAP: 8 (ref 5–15)
BUN: 13 mg/dL (ref 6–20)
CALCIUM: 9 mg/dL (ref 8.9–10.3)
CO2: 23 mmol/L (ref 22–32)
CREATININE: 0.71 mg/dL (ref 0.44–1.00)
Chloride: 108 mmol/L (ref 101–111)
GFR calc Af Amer: >60 mL/min (ref >60)
GLUCOSE: 112 mg/dL — AB (ref 65–99)
Potassium: 3.7 mmol/L (ref 3.5–5.1)
Sodium: 139 mmol/L (ref 135–145)

## 2017-08-03 LAB — CBC
HCT: 39 % (ref 35.0–47.0)
HEMOGLOBIN: 13.2 g/dL (ref 12.0–16.0)
MCH: 30.6 pg (ref 26.0–34.0)
MCHC: 33.7 g/dL (ref 32.0–36.0)
MCV: 90.5 fL (ref 80.0–100.0)
PLATELETS: 216 10*3/uL (ref 150–440)
RBC: 4.3 MIL/uL (ref 3.80–5.20)
RDW: 13.4 % (ref 11.5–14.5)
WBC: 9.1 10*3/uL (ref 3.6–11.0)

## 2017-08-03 LAB — TROPONIN I

## 2017-08-03 LAB — POCT PREGNANCY, URINE: Preg Test, Ur: NEGATIVE

## 2017-08-03 MED ORDER — IOPAMIDOL (ISOVUE-370) INJECTION 76%
75.0000 mL | Freq: Once | INTRAVENOUS | Status: AC | PRN
  Administered 2017-08-03: 75 mL via INTRAVENOUS

## 2017-08-03 NOTE — ED Notes (Signed)
Patient transported to CT 

## 2017-08-03 NOTE — ED Provider Notes (Addendum)
[facility] Emergency Department Provider Note  ____________________________________________  Time seen: Approximately 10:52 PM  I have reviewed the triage vital signs and the nursing notes.   HISTORY  Chief Complaint Shortness of Breath and DVT    HPI Ellen Jackson is a 26 y.o. female w/ a h/o dvt and PE, on AC, p/w chest pain causing mild pleuritic discomfort.  Start this monring, constant all day, now somewhat improved but still present. Has been compliant with AC. Pain is sharp, moderate intensity, nonradiating. Associated with nonproductive cough. No hemoptysis.     Past Medical History:  Diagnosis Date  . Allergy   . Anxiety   . DVT (deep venous thrombosis) ([place]) 07/13/2017   RLE  . Dysrhythmia   . Fibromyalgia    everywhere  . GERD (gastroesophageal reflux disease)   . Hernia, umbilical   . Heterozygous MTHFR mutation C677T ([place])   . IBS (irritable bowel syndrome)   . Migraine with aura    bc powders is only thing that works.  . Ovarian cyst   . Spina bifida occulta   . SVT (supraventricular tachycardia) [facility])      Patient Active Problem List   Diagnosis Date Noted  . Pulmonary embolism ([place]) 07/23/2017  . DVT of popliteal vein ([place]) 07/13/2017  . Ankle fracture 06/20/2017  . Closed right ankle fracture 06/19/2017  . S/P umbilical hernia repair, follow-up exam 10/19/2016  . Exertional dyspnea 10/14/2016  . Recurrent umbilical hernia 23/76/2831  . Fibromyalgia 07/30/2016  . Anxiety 02/05/2015  . Headache, migraine 02/05/2015  . Clinical depression 02/05/2015  . Heterozygous MTHFR mutation C677T ([place]) 02/05/2015  . 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 Paroxysmal supraventricular tachycardia ([place]) 02/05/2015  . LLQ abdominal pain 02/05/2015  . Anxiety disorder 02/05/2015  . Major depressive disorder, single episode 02/05/2015  . Gonalgia 10/16/2014  . Pain in joint involving lower leg 10/16/2014     Past Surgical History:   Procedure Laterality Date  . APPENDECTOMY  2015  . CESAREAN SECTION  2011  . CHOLECYSTECTOMY  2005  . HERNIA REPAIR  5176   Umbilical Hernia Repair with mesh  . KNEE ARTHROSCOPY Left 2015   X 3. plate and pin in left shin placed after several surgeries. last surgery for this was 20154  . ORIF ANKLE FRACTURE Right 06/20/2017   Procedure: OPEN REDUCTION INTERNAL FIXATION (ORIF) TRIMALLEOLAR ANKLE FRACTURE LATERAL LIGAMENT RECONSTRUCTION;  Surgeon: Thornton Park, MD;  Location: [facility] ORS;  Service: Orthopedics;  Laterality: Right;  . OVARIAN CYST SURGERY    . UMBILICAL HERNIA REPAIR N/A 10/09/2016   Procedure: HERNIA REPAIR UMBILICAL ADULT;  Surgeon: Olean Ree, MD;  Location: [facility] ORS;  Service: General;  Laterality: N/A;     Prior to Admission medications   Medication Sig Start Date End Date Taking? Authorizing Provider  albuterol (PROVENTIL HFA;VENTOLIN HFA) 108 (90 Base) MCG/ACT inhaler Inhale 2 puffs into the lungs every 6 (six) hours as needed for wheezing or shortness of breath. 11/17/16   Johnson, Megan P, DO  albuterol (PROVENTIL) (2.5 MG/3ML) 0.083% nebulizer solution Take 3 mLs (2.5 mg total) by nebulization every 6 (six) hours as needed for wheezing or shortness of breath. 06/23/17   Thornton Park, MD  cyclobenzaprine (FLEXERIL) 10 MG tablet Take 1 tablet (10 mg total) by mouth 3 (three) times daily as needed for muscle spasms. Patient not taking: Reported on 03/09/2017 10/16/16   Jules Husbands, MD  LORazepam (ATIVAN) 0.5 MG tablet Take 1 tablet (0.5 mg total)  by mouth 2 (two) times daily as needed for anxiety. 07/21/17   Volney American, PA-C  medroxyPROGESTERone (DEPO-PROVERA) 150 MG/ML injection Inject 150 mg into the muscle every 3 (three) months. Next injection due 12-2016    [provider]  metoprolol succinate (TOPROL XL) 25 MG 24 hr tablet Take 1.5 tablets (37.5 mg total) by mouth daily. 04/13/17   Johnson, Megan P, DO  polyethylene glycol (MIRALAX /  GLYCOLAX) packet Take 17 g by mouth 2 (two) times daily as needed for mild constipation.     [provider]  pregabalin (LYRICA) 150 MG capsule Take 1 capsule (150 mg total) by mouth 2 (two) times daily. 07/26/17   Johnson, Megan P, DO  pregabalin (LYRICA) 75 MG capsule Take by mouth. 11/24/16   [provider]  ranitidine (ZANTAC) 150 MG tablet Take 1 tablet (150 mg total) by mouth daily as needed for heartburn. 07/02/16   Johnson, Megan P, DO  venlafaxine XR (EFFEXOR XR) 37.5 MG 24 hr capsule Take 1 capsule (37.5 mg total) by mouth 2 (two) times daily. 07/21/17   Volney American, PA-C     Allergies Codeine; Latex; Morphine; Nalbuphine; Ondansetron hcl; Other; Penicillins; Codeine sulfate; Metoclopramide; Prochlorperazine; Prochlorperazine edisylate; Promethazine; Tape; and Tizanidine   Family History  Problem Relation Age of Onset  . Arthritis Mother   . Hyperlipidemia Mother   . Hypertension Mother   . Migraines Mother   . Arthritis Father   . Autism Son   . Cancer Maternal Grandfather        pancreatic  . Stroke Maternal Grandfather   . Diabetes Paternal Grandmother     Social History Social History   Tobacco Use  . Smoking status: Current Every Day Smoker    Packs/day: 0.50    Years: 3.00    Pack years: 1.50    Types: Cigarettes  . Smokeless tobacco: Never Used  Substance Use Topics  . Alcohol use: No    Alcohol/week: 0.0 oz  . Drug use: No    Review of Systems  Constitutional:   No fever or chills.  ENT:   No sore throat. No rhinorrhea. Cardiovascular:   Positive posterior chest pain without syncope. Respiratory:   Positive shortness of breath, no cough. Gastrointestinal:   Negative for abdominal pain, vomiting and diarrhea.  Musculoskeletal:  Positive right leg pain without swelling All other systems reviewed and are negative except as documented above in ROS and HPI.  ____________________________________________   PHYSICAL  EXAM:  VITAL SIGNS: ED Triage Vitals  Enc Vitals Group     BP 08/03/17 1646 129/72     Pulse Rate 08/03/17 1646 86     Resp 08/03/17 1646 16     Temp 08/03/17 1646 98.8 F (37.1 C)     Temp Source 08/03/17 1646 Oral     SpO2 08/03/17 1646 100 %     Weight 08/03/17 1647 180 lb (81.6 kg)     Height 08/03/17 1647 5\' 7"  (1.702 m)     Head Circumference --      Peak Flow --      Pain Score 08/03/17 1646 8     Pain Loc --      Pain Edu? --      Excl. in [place]? --     Vital signs reviewed, nursing assessments reviewed.   Constitutional:   Alert and oriented. Well appearing and in no distress. Eyes:   No scleral icterus.  EOMI. No nystagmus. No conjunctival  pallor. PERRL. ENT   Head:   Normocephalic and atraumatic.   Nose:   No congestion/rhinnorhea.    Mouth/Throat:   MMM, no pharyngeal erythema. No peritonsillar mass.    Neck:   No meningismus. Full ROM. Hematological/Lymphatic/Immunilogical:   No cervical lymphadenopathy. Cardiovascular:   RRR. Symmetric bilateral radial and DP pulses.  No murmurs.  Respiratory:   Normal respiratory effort without tachypnea/retractions. Breath sounds are clear and equal bilaterally. No wheezes/rales/rhonchi. Gastrointestinal:   Soft and nontender. Non distended. There is no CVA tenderness.  No rebound, rigidity, or guarding. Genitourinary:   deferred Musculoskeletal:   Normal range of motion in all extremities. No joint effusions.  No lower extremity tenderness.  No edema. Right upper back tender to touch reproducing her posterior chest pain Neurologic:   Normal speech and language.  Motor grossly intact. No gross focal neurologic deficits are appreciated.  Skin:    Skin is warm, dry and intact. No rash noted.  No petechiae, purpura, or bullae.  ____________________________________________    LABS (pertinent positives/negatives) (all labs ordered are listed, but only abnormal results are displayed) Labs Reviewed  BASIC METABOLIC  PANEL - Abnormal; Notable for the following components:      Result Value   Glucose, Bld 112 (*)    All other components within normal limits  CBC  TROPONIN I  POCT PREGNANCY, URINE  POC URINE PREG, ED   ____________________________________________   EKG  Interpreted by me Normal sinus rhythm rate of 84, normal axis and intervals. Poor R-wave progression in anterior precordial leads. Normal ST segments and T waves.  ____________________________________________    RADIOLOGY  Dg Chest 2 View  Result Date: 08/03/2017 CLINICAL DATA:  26 y/o female with increasing shortness of breath and right lower extremity pain for 1 day. Right lower extremity DVT with pulmonary embolus diagnosed in November. Smoker. EXAM: CHEST  2 VIEW COMPARISON:  CTA chest 07/17/2017 and earlier. FINDINGS: Seated AP and lateral views of the chest. Lung volumes are within normal limits. Mediastinal contours are stable and within normal limits. No pneumothorax, pulmonary edema, pleural effusion or confluent pulmonary opacity. Stable mild increased interstitial markings which might be related to smoking. Negative visible bowel gas pattern. Stable cholecystectomy clips. No acute osseous abnormality identified. IMPRESSION: Stable radiographic appearance of the chest. No acute cardiopulmonary abnormality. Electronically Signed   By: Genevie Ann M.D.   On: 08/03/2017 17:28   Ct Angio Chest Pe W And/or Wo Contrast  Result Date: 08/03/2017 CLINICAL DATA:  Shortness of breath. EXAM: CT ANGIOGRAPHY CHEST WITH CONTRAST TECHNIQUE: Multidetector CT imaging of the chest was performed using the standard protocol during bolus administration of intravenous contrast. Multiplanar CT image reconstructions and MIPs were obtained to evaluate the vascular anatomy. CONTRAST:  11mL ISOVUE-370 IOPAMIDOL (ISOVUE-370) INJECTION 76% COMPARISON:  Radiographs of same day.  CT scan of July 17, 2017. FINDINGS: Cardiovascular: Satisfactory opacification  of the pulmonary arteries to the segmental level. No evidence of pulmonary embolism. Normal heart size. No pericardial effusion. Mediastinum/Nodes: Esophagus appears normal. No adenopathy is noted. Stable 14 mm left thyroid nodule is noted. Lungs/Pleura: Lungs are clear. No pleural effusion or pneumothorax. Upper Abdomen: No acute abnormality. Musculoskeletal: No chest wall abnormality. No acute or significant osseous findings. Review of the MIP images confirms the above findings. IMPRESSION: Right-sided pulmonary embolus noted on prior exam is no longer visualized. No new pulmonary emboli are noted. 14 mm left thyroid nodule is noted. Thyroid ultrasound is recommended for further evaluation. Electronically Signed  By: Marijo Conception, M.D.   On: 08/03/2017 20:46   US Venous Img Lower Unilateral Right  Result Date: 08/03/2017 CLINICAL DATA:  Right lower extremity pain and edema for the past day. Recent ankle fracture. History of DVT and pulmonary embolism. Evaluate for acute or chronic DVT. EXAM: RIGHT LOWER EXTREMITY VENOUS DOPPLER ULTRASOUND TECHNIQUE: Gray-scale sonography with graded compression, as well as color Doppler and duplex ultrasound were performed to evaluate the lower extremity deep venous systems from the level of the common femoral vein and including the common femoral, femoral, profunda femoral, popliteal and calf veins including the posterior tibial, peroneal and gastrocnemius veins when visible. The superficial great saphenous vein was also interrogated. Spectral Doppler was utilized to evaluate flow at rest and with distal augmentation maneuvers in the common femoral, femoral and popliteal veins. COMPARISON:  None. FINDINGS: Contralateral Common Femoral Vein: Respiratory phasicity is normal and symmetric with the symptomatic side. No evidence of thrombus. Normal compressibility. Common Femoral Vein: No evidence of thrombus. Normal compressibility, respiratory phasicity and response to  augmentation. Saphenofemoral Junction: No evidence of thrombus. Normal compressibility and flow on color Doppler imaging. Profunda Femoral Vein: No evidence of thrombus. Normal compressibility and flow on color Doppler imaging. Femoral Vein: No evidence of thrombus. Normal compressibility, respiratory phasicity and response to augmentation. Popliteal Vein: No evidence of thrombus. Normal compressibility, respiratory phasicity and response to augmentation. Calf Veins: Not imaged secondary to overlying casting material. Superficial Great Saphenous Vein: No evidence of thrombus. Normal compressibility. Venous Reflux:  None. Other Findings:  None. IMPRESSION: No evidence of DVT within the right lower extremity. Electronically Signed   By: Sandi Mariscal M.D.   On: 08/03/2017 17:45    ____________________________________________   PROCEDURES Procedures  ____________________________________________   DIFFERENTIAL DIAGNOSIS  Recurrent PE, pneumonia, pulmonary infarct  CLINICAL IMPRESSION / ASSESSMENT AND PLAN / ED COURSE  Pertinent labs & imaging results that were available during my care of the patient were reviewed by me and considered in my medical decision making (see chart for details).   Patient presents with chest pain shortness of breath and right leg pain which are similar to recent DVT and PE  that she had. She has been compliant with Eliquis. Workup with labs and chest x-ray is unremarkable. Ultrasound negative for DVT. Proceeded with CT to ensure that she didn't have a treatment failure causing recurrent or worsening PE burden, essentially negative with no demonstrable PE. Vital signs remained normal throughout her stay in the ED. Extremities no identifiable cause for her symptoms, but a lot of reassuring data. Low suspicion of ACS dissection pneumothorax carditis early soft tissue infection. Suitable for outpatient follow-up.      ____________________________________________   FINAL  CLINICAL IMPRESSION(S) / ED DIAGNOSES    Final diagnoses:  Shortness of breath  Right leg pain      This SmartLink is deprecated. Use AVSMEDLIST instead to display the medication list for a patient.   Portions of this note were generated with dragon dictation software. Dictation errors may occur despite best attempts at proofreading.    Carrie Mew, MD 08/03/17 2300    Carrie Mew, MD 08/24/17 2012

## 2017-08-03 NOTE — ED Triage Notes (Signed)
Pt in via POV with complaints of increasing shortness of breath and increasing pain to RLE x one day.  Pt with known DVT to RLE and PE to right lung x approximately 3 weeks ago, on eliquis.  Vitals WDL, NAD noted at this time.

## 2017-08-03 NOTE — Discharge Instructions (Signed)
Your labs, US of the leg, and CT scan of the chest were all unremarkable.

## 2017-08-05 ENCOUNTER — Telehealth: Payer: Self-pay | Admitting: Family Medicine

## 2017-08-05 NOTE — Telephone Encounter (Signed)
Ellen Jackson- can we please check to see if she can see anyone sooner?

## 2017-08-05 NOTE — Telephone Encounter (Signed)
Copied from [place]. Topic: Referral - Request >> Aug 05, 2017  4:24 PM Ellen Jackson, NT wrote: Reason for CRM: Pt called and said she was referred to a endocrinologist and the earliest appointment is early March. Pt wanted to know if she can be referred to someone else that can see her sooner.Pt. Would like a call back. Pt. Is willing to go anywhere.

## 2017-08-06 NOTE — Telephone Encounter (Signed)
Referral directed to [facility] in [place]. MD to review then they will let us know outcome. Patient notified.

## 2017-08-06 NOTE — Telephone Encounter (Signed)
Perfect. Thanks.

## 2017-08-23 ENCOUNTER — Ambulatory Visit: Payer: Medicaid Other | Admitting: Family Medicine

## 2017-08-26 ENCOUNTER — Other Ambulatory Visit: Payer: Self-pay | Admitting: Family Medicine

## 2017-09-13 DIAGNOSIS — R002 Palpitations: Secondary | ICD-10-CM | POA: Insufficient documentation

## 2017-09-13 DIAGNOSIS — I82409 Acute embolism and thrombosis of unspecified deep veins of unspecified lower extremity: Secondary | ICD-10-CM | POA: Insufficient documentation

## 2017-09-13 HISTORY — DX: Palpitations: R00.2

## 2017-09-21 ENCOUNTER — Other Ambulatory Visit: Payer: Self-pay

## 2017-09-24 ENCOUNTER — Other Ambulatory Visit (INDEPENDENT_AMBULATORY_CARE_PROVIDER_SITE_OTHER): Payer: Self-pay | Admitting: Vascular Surgery

## 2017-09-24 ENCOUNTER — Telehealth (INDEPENDENT_AMBULATORY_CARE_PROVIDER_SITE_OTHER): Payer: Self-pay

## 2017-09-24 ENCOUNTER — Ambulatory Visit (INDEPENDENT_AMBULATORY_CARE_PROVIDER_SITE_OTHER): Payer: Medicaid Other | Admitting: Surgery

## 2017-09-24 ENCOUNTER — Encounter: Payer: Self-pay | Admitting: Surgery

## 2017-09-24 VITALS — BP 125/83 | HR 79 | Temp 98.2°F | Ht 67.0 in | Wt 191.0 lb

## 2017-09-24 DIAGNOSIS — K439 Ventral hernia without obstruction or gangrene: Secondary | ICD-10-CM | POA: Diagnosis not present

## 2017-09-24 NOTE — Telephone Encounter (Signed)
Called the patient back to let her know of Dr. Bunnie Domino suggestions for her to stay on the Eliquis at least for a 90 day period, or he would suggest that she be changed to another type of anticoagulant. The patient agreed to stay on the Eliquis until she comes in the see [place] in March, and if her problems presist, she will discuss changing medications at that time.

## 2017-09-24 NOTE — Progress Notes (Signed)
09/24/2017  History of Present Illness: Ellen Jackson is a 27 y.o. female who presents with lower abdominal pain.  She's s/p umbilical hernia repair with mesh on 10/09/16.  Recently, she had had some medical and surgical issues and needed a right ankle surgery in October and had a PE in November and is currently on eliquis for anticoagulation.  She reports that she's been having some diarrhea that she feels is associated to the medication.    From the abdominal standpoint, she reports that recently she has been having some abdominal pain at the umbilicus that shoots down towards the pelvis in the midline.  It happens with movements physical activity and not necessarily related to food.  Denies any fevers or chills, blood in the stool, chest pain or shortness of breath.  Past Medical History: Past Medical History:  Diagnosis Date  . Allergy   . Anxiety   . DVT (deep venous thrombosis) ([place]) 07/13/2017   RLE  . Dysrhythmia   . Fibromyalgia    everywhere  . GERD (gastroesophageal reflux disease)   . Hernia, umbilical   . Heterozygous MTHFR mutation C677T ([place])   . IBS (irritable bowel syndrome)   . Migraine with aura    bc powders is only thing that works.  . Ovarian cyst   . Spina bifida occulta   . SVT (supraventricular tachycardia) ([facility])      Past Surgical History: Past Surgical History:  Procedure Laterality Date  . APPENDECTOMY  2015  . CESAREAN SECTION  2011  . CHOLECYSTECTOMY  2005  . HERNIA REPAIR  9562   Umbilical Hernia Repair with mesh  . KNEE ARTHROSCOPY Left 2015   X 3. plate and pin in left shin placed after several surgeries. last surgery for this was 20154  . ORIF ANKLE FRACTURE Right 06/20/2017   Procedure: OPEN REDUCTION INTERNAL FIXATION (ORIF) TRIMALLEOLAR ANKLE FRACTURE LATERAL LIGAMENT RECONSTRUCTION;  Surgeon: Thornton Park, MD;  Location: [facility] ORS;  Service: Orthopedics;  Laterality: Right;  . OVARIAN CYST SURGERY    . UMBILICAL HERNIA REPAIR N/A  10/09/2016   Procedure: HERNIA REPAIR UMBILICAL ADULT;  Surgeon: Olean Ree, MD;  Location: [facility] ORS;  Service: General;  Laterality: N/A;    Home Medications: Prior to Admission medications   Medication Sig Start Date End Date Taking? Authorizing Provider  albuterol (PROVENTIL HFA;VENTOLIN HFA) 108 (90 Base) MCG/ACT inhaler Inhale 2 puffs into the lungs every 6 (six) hours as needed for wheezing or shortness of breath. 11/17/16  Yes Johnson, Megan P, DO  albuterol (PROVENTIL) (2.5 MG/3ML) 0.083% nebulizer solution Take 3 mLs (2.5 mg total) by nebulization every 6 (six) hours as needed for wheezing or shortness of breath. 06/23/17  Yes Thornton Park, MD  cyclobenzaprine (FLEXERIL) 10 MG tablet Take 1 tablet (10 mg total) by mouth 3 (three) times daily as needed for muscle spasms. 10/16/16  Yes Pabon, Diego F, MD  HYDROcodone-acetaminophen (NORCO/VICODIN) 5-325 MG tablet hydrocodone 5 mg-acetaminophen 325 mg tablet  TK 1 T PO Q 4 H   Yes [provider]  LORazepam (ATIVAN) 0.5 MG tablet Take 1 tablet (0.5 mg total) by mouth 2 (two) times daily as needed for anxiety. 07/21/17  Yes Volney American, PA-C  metoprolol succinate (TOPROL-XL) 25 MG 24 hr tablet TAKE 1 AND 1/2 TABLETS(37.5 MG) BY MOUTH DAILY 08/26/17  Yes Johnson, Megan P, DO  polyethylene glycol (MIRALAX / GLYCOLAX) packet Take 17 g by mouth 2 (two) times daily as needed for mild constipation.  Yes [provider]  predniSONE (DELTASONE) 5 MG tablet prednisone 5 mg tablets in a dose pack  TK PO UTD   Yes [provider]  pregabalin (LYRICA) 150 MG capsule Take 1 capsule (150 mg total) by mouth 2 (two) times daily. 07/26/17  Yes Johnson, Megan P, DO  ranitidine (ZANTAC) 150 MG tablet Take 1 tablet (150 mg total) by mouth daily as needed for heartburn. 07/02/16  Yes Johnson, Megan P, DO  sucralfate (CARAFATE) 1 g tablet Take 1 tablet by mouth every 6 (six) hours as needed.   Yes [provider]   traMADol (ULTRAM) 50 MG tablet TK 1 T PO  Q 6 H PRN P 09/01/17  Yes [provider]  venlafaxine XR (EFFEXOR XR) 37.5 MG 24 hr capsule Take 1 capsule (37.5 mg total) by mouth 2 (two) times daily. 07/21/17  Yes Volney American, PA-C  ELIQUIS 5 MG TABS tablet TAKE 1 TABLET BY MOUTH TWICE DAILY 09/24/17   Algernon Huxley, MD    Allergies: Allergies  Allergen Reactions  . Codeine     Other reaction(s): Other (See Comments) Passes out  . Latex Itching and Rash    Lips itching  . Morphine Itching and Shortness Of Breath  . Nalbuphine Itching and Shortness Of Breath  . Ondansetron Hcl Hives and Rash  . Other Hives    Allergic to steri strips. tegaderm ok.. Paper tape is ok. Clear plastic tape rips off skin  . Penicillins Rash    Patient reports severe rash and itching. Has patient had a PCN reaction causing immediate rash, facial/tongue/throat swelling, SOB or lightheadedness with hypotension: Yes Has patient had a PCN reaction causing severe rash involving mucus membranes or skin necrosis: No Has patient had a PCN reaction that required hospitalization unknown Has patient had a PCN reaction occurring within the last 10 years:  unknown If all of the above answers are "NO", then may proceed  . Codeine Sulfate Other (See Comments)    fainting  . Metoclopramide Anxiety    Other reaction(s): Other (See Comments) "jittery" Difficulty breathing  . Prochlorperazine Anxiety and Other (See Comments)    Difficulty breathing  . Prochlorperazine Edisylate Anxiety  . Promethazine Anxiety and Other (See Comments)    "jittery" Difficulty breathing     . Tape     See above ... Paper tape ok  . Tizanidine Other (See Comments)    Worsens headache    Review of Systems: Review of Systems  Constitutional: Negative for chills and fever.  Respiratory: Negative for shortness of breath.   Cardiovascular: Negative for chest pain.  Gastrointestinal: Positive for abdominal pain and  diarrhea. Negative for constipation, nausea and vomiting.    Physical Exam BP 125/83   Pulse 79   Temp 98.2 F (36.8 C) (Oral)   Ht 5\' 7"  (1.702 m)   Wt 86.6 kg (191 lb)   BMI 29.91 kg/m  CONSTITUTIONAL: No acute distress RESPIRATORY:  Lungs are clear, and breath sounds are equal bilaterally. Normal respiratory effort without pathologic use of accessory muscles. CARDIOVASCULAR: Heart is regular without murmurs, gallops, or rubs. GI: The abdomen is soft, non-distended, with mild discomfort to palpation at the umbilicus and infraumbilical areas.  There is no discernable hernia defect on palpation.  Incision has healed very well.  Her umbilical skin appears to have detached at the stalk from the abdominal wall but there is no palpable hernia defect. There were no palpable masses.  MUSCULOSKELETAL:  Right lower  leg/foot brace in place. NEUROLOGIC:  Motor and sensation is grossly normal.  Cranial nerves are grossly intact. PSYCH:  Alert and oriented to person, place and time. Affect is normal.  Labs/Imaging: None recently  Assessment and Plan: This is a 27 y.o. female who presents with abdominal pain s/p umbilical hernia repair with mesh almost a year ago.  --Discussed with the patient that although I do not feel a hernia defect, would be worthwhile to obtain a CT scan of abdomen and pelvis to evaluate the mesh, which could have moved out of position.  This could simply be related to scar tissue, but we should evaluate to make sure. --If CT scan is positive for a surgical issue, then we'll have her come back for another appointment to discuss surgery options.  If negative, then patient may follow up on an as needed basis.  She may be interested in re-tucking her umbilical skin, though I did discuss with her that in absence of a hernia defect, this would be a cosmetic repair.  She understands this plan and all of her questions have been answered.  Face-to-face time spent with the patient and  care providers was 25 minutes, with more than 50% of the time spent counseling, educating, and coordinating care of the patient.     Melvyn Neth, [place]

## 2017-09-24 NOTE — Telephone Encounter (Signed)
Patient called stating that she is having frequent migraines, overall not feeling well.   Wants to know if she can stop taking the Eliquis, she thinks that her symptoms are coming from the blood thinner because they started when she started taking them back in November.

## 2017-09-24 NOTE — Patient Instructions (Signed)
We have scheduled you for a CT Scan of your Abdomen and Pelvis. This has been scheduled at 1:30PM at our [place] location. Please Check-in at 1:15PM, 15 minutes prior to your scheduled appointment. If you need to reschedule your Scan, you may do so by calling [phone].  You will need to pick up a prep kit at least 24 hours in advance of your Scan: You may pick this up at the [facility] department at [place] [facility], or [employer].  Bring a list of medications with you to your appointment and you may have nothing to eat or drink 4 hours prior to your CT Scan.    [address], [place], [place] [address]   We will follow up with you after your CT Scan.  However if you have any questions or concerns prior please give our office a call.

## 2017-10-01 ENCOUNTER — Ambulatory Visit
Admission: RE | Admit: 2017-10-01 | Discharge: 2017-10-01 | Disposition: A | Discharge status: Home / Self Care | Payer: Medicaid Other | Source: Ambulatory Visit | Attending: Surgery | Admitting: Surgery

## 2017-10-01 DIAGNOSIS — K439 Ventral hernia without obstruction or gangrene: Secondary | ICD-10-CM | POA: Diagnosis present

## 2017-10-01 MED ORDER — IOPAMIDOL (ISOVUE-300) INJECTION 61%
100.0000 mL | Freq: Once | INTRAVENOUS | Status: AC | PRN
  Administered 2017-10-01: 100 mL via INTRAVENOUS

## 2017-10-04 ENCOUNTER — Telehealth: Payer: Self-pay

## 2017-10-04 NOTE — Telephone Encounter (Signed)
Called made to patient at this time. I reviewed her CT Scan results and verbalized to her that if she needed anything further to please give our office a call.

## 2017-10-06 ENCOUNTER — Encounter: Payer: Self-pay | Admitting: Endocrinology

## 2017-10-06 ENCOUNTER — Ambulatory Visit (INDEPENDENT_AMBULATORY_CARE_PROVIDER_SITE_OTHER): Payer: Medicaid Other | Admitting: Endocrinology

## 2017-10-06 DIAGNOSIS — E041 Nontoxic single thyroid nodule: Secondary | ICD-10-CM | POA: Insufficient documentation

## 2017-10-06 LAB — TSH: TSH: 0.91 u[IU]/mL (ref 0.35–4.50)

## 2017-10-06 NOTE — Patient Instructions (Addendum)
Let's check the ultrasound.  you will receive a phone call, about a day and time for an appointment.  blood tests are requested for you today.  We'll let you know about the results.  most of the time, a "lumpy thyroid" will eventually become overactive.  this is usually a slow process, happening over the span of many years.

## 2017-10-06 NOTE — Progress Notes (Signed)
Subjective:    Patient ID: Ellen Jackson, female    DOB: [birth year], 27 y.o.   MRN: [id number]  HPI Pt is referred by Dr Wynetta Emery, for nodular thyroid.  On CT, pt was incidentally noted to have a thyroid nodule in 2018.  she is unaware of ever having had thyroid problems in the past.  she has no h/o XRT or surgery to the neck.  She reports moderate sensation of needing of clear the throat, but no assoc neck pain. Past Medical History:  Diagnosis Date  . Allergy   . Anxiety   . DVT (deep venous thrombosis) ([place]) 07/13/2017   RLE  . Dysrhythmia   . Fibromyalgia    everywhere  . GERD (gastroesophageal reflux disease)   . Hernia, umbilical   . Heterozygous MTHFR mutation C677T ([place])   . IBS (irritable bowel syndrome)   . Migraine with aura    bc powders is only thing that works.  . Ovarian cyst   . Spina bifida occulta   . SVT (supraventricular tachycardia) ([facility])     Past Surgical History:  Procedure Laterality Date  . APPENDECTOMY  2015  . CESAREAN SECTION  2011  . CHOLECYSTECTOMY  2005  . HERNIA REPAIR  4709   Umbilical Hernia Repair with mesh  . KNEE ARTHROSCOPY Left 2015   X 3. plate and pin in left shin placed after several surgeries. last surgery for this was 20154  . ORIF ANKLE FRACTURE Right 06/20/2017   Procedure: OPEN REDUCTION INTERNAL FIXATION (ORIF) TRIMALLEOLAR ANKLE FRACTURE LATERAL LIGAMENT RECONSTRUCTION;  Surgeon: Thornton Park, MD;  Location: [facility];  Service: Orthopedics;  Laterality: Right;  . OVARIAN CYST SURGERY    . UMBILICAL HERNIA REPAIR N/A 10/09/2016   Procedure: HERNIA REPAIR UMBILICAL ADULT;  Surgeon: Olean Ree, MD;  Location: [facility];  Service: General;  Laterality: N/A;    Social History   Socioeconomic History  . Marital status: Single    Spouse name: Not on file  . Number of children: Not on file  . Years of education: Not on file  . Highest education level: Not on file  Social Needs  . Financial resource strain: Not on file    . Food insecurity - worry: Not on file  . Food insecurity - inability: Not on file  . Transportation needs - medical: Not on file  . Transportation needs - non-medical: Not on file  Occupational History  . Not on file  Tobacco Use  . Smoking status: Current Every Day Smoker    Packs/day: 0.50    Years: 3.00    Pack years: 1.50    Types: Cigarettes  . Smokeless tobacco: Never Used  Substance and Sexual Activity  . Alcohol use: No    Alcohol/week: 0.0 oz  . Drug use: No  . Sexual activity: No  Other Topics Concern  . Not on file  Social History Narrative  . Not on file    Current Outpatient Medications on File Prior to Visit  Medication Sig Dispense Refill  . albuterol (PROVENTIL HFA;VENTOLIN HFA) 108 (90 Base) MCG/ACT inhaler Inhale 2 puffs into the lungs every 6 (six) hours as needed for wheezing or shortness of breath. 1 Inhaler 0  . albuterol (PROVENTIL) (2.5 MG/3ML) 0.083% nebulizer solution Take 3 mLs (2.5 mg total) by nebulization every 6 (six) hours as needed for wheezing or shortness of breath. 75 mL 12  . cyclobenzaprine (FLEXERIL) 10 MG tablet Take 1 tablet (10 mg total) by mouth  3 (three) times daily as needed for muscle spasms. 30 tablet 0  . ELIQUIS 5 MG TABS tablet TAKE 1 TABLET BY MOUTH TWICE DAILY 60 tablet 6  . LORazepam (ATIVAN) 0.5 MG tablet Take 1 tablet (0.5 mg total) by mouth 2 (two) times daily as needed for anxiety. 60 tablet 0  . metoprolol succinate (TOPROL-XL) 25 MG 24 hr tablet TAKE 1 AND 1/2 TABLETS(37.5 MG) BY MOUTH DAILY 45 tablet 0  . polyethylene glycol (MIRALAX / GLYCOLAX) packet Take 17 g by mouth 2 (two) times daily as needed for mild constipation.     . pregabalin (LYRICA) 150 MG capsule Take 1 capsule (150 mg total) by mouth 2 (two) times daily. 180 capsule 1  . ranitidine (ZANTAC) 150 MG tablet Take 1 tablet (150 mg total) by mouth daily as needed for heartburn. 90 tablet 3  . sucralfate (CARAFATE) 1 g tablet Take 1 tablet by mouth every 6  (six) hours as needed.    . venlafaxine XR (EFFEXOR XR) 37.5 MG 24 hr capsule Take 1 capsule (37.5 mg total) by mouth 2 (two) times daily. 60 capsule 5  . HYDROcodone-acetaminophen (NORCO/VICODIN) 5-325 MG tablet hydrocodone 5 mg-acetaminophen 325 mg tablet  TK 1 T PO Q 4 H    . predniSONE (DELTASONE) 5 MG tablet prednisone 5 mg tablets in a dose pack  TK PO UTD    . traMADol (ULTRAM) 50 MG tablet TK 1 T PO  Q 6 H PRN P  0   Current Facility-Administered Medications on File Prior to Visit  Medication Dose Route Frequency Provider Last Rate Last Dose  . medroxyPROGESTERone (DEPO-PROVERA) injection 150 mg  150 mg Intramuscular Q90 days Park Liter P, DO   150 mg at 04/13/17 1112    Allergies  Allergen Reactions  . Codeine     Other reaction(s): Other (See Comments) Passes out  . Latex Itching and Rash    Lips itching  . Morphine Itching and Shortness Of Breath  . Nalbuphine Itching and Shortness Of Breath  . Ondansetron Hcl Hives and Rash  . Other Hives    Allergic to steri strips. tegaderm ok.. Paper tape is ok. Clear plastic tape rips off skin  . Penicillins Rash    Patient reports severe rash and itching. Has patient had a PCN reaction causing immediate rash, facial/tongue/throat swelling, SOB or lightheadedness with hypotension: Yes Has patient had a PCN reaction causing severe rash involving mucus membranes or skin necrosis: No Has patient had a PCN reaction that required hospitalization unknown Has patient had a PCN reaction occurring within the last 10 years:  unknown If all of the above answers are "NO", then may proceed  . Codeine Sulfate Other (See Comments)    fainting  . Metoclopramide Anxiety    Other reaction(s): Other (See Comments) "jittery" Difficulty breathing  . Prochlorperazine Anxiety and Other (See Comments)    Difficulty breathing  . Prochlorperazine Edisylate Anxiety  . Promethazine Anxiety and Other (See Comments)    "jittery" Difficulty  breathing     . Tape     See above ... Paper tape ok  . Tizanidine Other (See Comments)    Worsens headache    Family History  Problem Relation Age of Onset  . Arthritis Mother   . Hyperlipidemia Mother   . Hypertension Mother   . Migraines Mother   . Arthritis Father   . Autism Son   . Cancer Maternal Grandfather        pancreatic  .  Stroke Maternal Grandfather   . Diabetes Paternal Grandmother   . Graves' disease Maternal Aunt     BP 116/76 (BP Location: Left Arm, Patient Position: Sitting, Cuff Size: Normal)   Pulse 100   Wt 189 lb 3.2 oz (85.8 kg)   SpO2 98%   BMI 29.63 kg/m     Review of Systems Denies hoarseness, visual loss, chest pain, sob, dysphagia, itching, depression, cold intolerance, and numbness.  She has weight gain, intermitt diarrhea, flushing, headache, rhinorrhea, easy bruising, and slight cough     Objective:   Physical Exam VS: see vs page GEN: no distress HEAD: head: no deformity eyes: no periorbital swelling, no proptosis external nose and ears are normal mouth: no lesion seen NECK: supple, thyroid is not enlarged.  I cannot appreciate the nodule. CHEST WALL: no deformity LUNGS: clear to auscultation CV: reg rate and rhythm, no murmur ABD: abdomen is soft, nontender.  no hepatosplenomegaly.  not distended.  no hernia MUSCULOSKELETAL: muscle bulk and strength are grossly normal.  no obvious joint swelling.  gait is normal and steady EXTEMITIES: no deformity.  Trace bilat leg edema.  PULSES: no carotid bruit NEURO:  cn 2-12 grossly intact.   readily moves all 4's.  sensation is intact to touch on all 4's SKIN:  Normal texture and temperature.  No rash or suspicious lesion is visible.   NODES:  None palpable at the neck PSYCH: alert, well-oriented.  Does not appear anxious nor depressed.    CT: 11 mm left thyroid nodule is noted.  No change since 2015.   I have reviewed outside records, and summarized: Pt was noted to have thyroid  nodule, and referred here.  CT had been done to exclude recurrent pulm emboli.   Lab Results  Component Value Date   TSH 0.91 10/06/2017      Assessment & Plan:  Thyroid nodule, new to me.  prob low risk for malignancy.  Euthyroid.    Patient Instructions  Let's check the ultrasound.  you will receive a phone call, about a day and time for an appointment.  blood tests are requested for you today.  We'll let you know about the results.  most of the time, a "lumpy thyroid" will eventually become overactive.  this is usually a slow process, happening over the span of many years.

## 2017-10-19 ENCOUNTER — Ambulatory Visit
Admission: RE | Admit: 2017-10-19 | Discharge: 2017-10-19 | Disposition: A | Discharge status: Home / Self Care | Payer: Medicaid Other | Source: Ambulatory Visit | Attending: Endocrinology | Admitting: Endocrinology

## 2017-10-19 DIAGNOSIS — E041 Nontoxic single thyroid nodule: Secondary | ICD-10-CM

## 2017-10-26 ENCOUNTER — Encounter (INDEPENDENT_AMBULATORY_CARE_PROVIDER_SITE_OTHER): Payer: Medicaid Other

## 2017-10-26 ENCOUNTER — Ambulatory Visit (INDEPENDENT_AMBULATORY_CARE_PROVIDER_SITE_OTHER): Payer: Medicaid Other | Admitting: Vascular Surgery

## 2017-10-28 ENCOUNTER — Encounter (INDEPENDENT_AMBULATORY_CARE_PROVIDER_SITE_OTHER): Payer: Medicaid Other

## 2017-10-28 ENCOUNTER — Ambulatory Visit (INDEPENDENT_AMBULATORY_CARE_PROVIDER_SITE_OTHER): Payer: Medicaid Other | Admitting: Vascular Surgery

## 2017-11-01 ENCOUNTER — Ambulatory Visit (INDEPENDENT_AMBULATORY_CARE_PROVIDER_SITE_OTHER): Payer: Medicaid Other | Admitting: Vascular Surgery

## 2017-11-03 ENCOUNTER — Encounter (INDEPENDENT_AMBULATORY_CARE_PROVIDER_SITE_OTHER): Payer: Self-pay | Admitting: Vascular Surgery

## 2017-11-03 ENCOUNTER — Ambulatory Visit (INDEPENDENT_AMBULATORY_CARE_PROVIDER_SITE_OTHER): Payer: Medicaid Other | Admitting: Vascular Surgery

## 2017-11-03 ENCOUNTER — Ambulatory Visit (INDEPENDENT_AMBULATORY_CARE_PROVIDER_SITE_OTHER): Payer: Medicaid Other

## 2017-11-03 VITALS — BP 111/72 | HR 82 | Resp 18 | Wt 192.0 lb

## 2017-11-03 DIAGNOSIS — E7212 Methylenetetrahydrofolate reductase deficiency: Secondary | ICD-10-CM | POA: Diagnosis not present

## 2017-11-03 DIAGNOSIS — I2699 Other pulmonary embolism without acute cor pulmonale: Secondary | ICD-10-CM | POA: Diagnosis not present

## 2017-11-03 DIAGNOSIS — Z1589 Genetic susceptibility to other disease: Secondary | ICD-10-CM

## 2017-11-03 DIAGNOSIS — I82431 Acute embolism and thrombosis of right popliteal vein: Secondary | ICD-10-CM | POA: Diagnosis not present

## 2017-11-04 ENCOUNTER — Encounter (INDEPENDENT_AMBULATORY_CARE_PROVIDER_SITE_OTHER): Payer: Self-pay | Admitting: Vascular Surgery

## 2017-11-04 NOTE — Progress Notes (Signed)
Subjective:    Patient ID: Ellen Jackson, female    DOB: 1990-11-02, 27 y.o.   MRN: [id number] Chief Complaint  Patient presents with  . Follow-up    90month DVT study   The patient presents for a 38-month DVT follow-up.  The patient presents today without complaint.  The patient denies any right lower extremity pain or swelling.  Patient denies any shortness of breath or chest pain.  CT of the chest on August 03, 2017 was notable for resolvement of previous PEs.  The patient is currently not taking Eliquis.  I am unsure as to who stopped this medication.  The patient is not on any anticoagulation such as aspirin or Plavix either.  The patient underwent a right lower extremity DVT ultrasound which was notable for no evidence of deep vein thrombosis to the right lower extremity.  The patient was seen with her mother who notes that she has 2 "clotting disorders" and gets DVTs and PEs all the time.  The patient notes that she has never been seen by a hematologist for a further workup.  The patient denies any fever, nausea vomiting.   Review of Systems  Constitutional: Negative.   HENT: Negative.   Eyes: Negative.   Respiratory: Negative.   Cardiovascular:       Right Lower Extremity DVT PE  Gastrointestinal: Negative.   Endocrine: Negative.   Genitourinary: Negative.   Musculoskeletal: Negative.   Skin: Negative.   Allergic/Immunologic: Negative.   Neurological: Negative.   Hematological: Negative.   Psychiatric/Behavioral: Negative.       Objective:   Physical Exam  Constitutional: She is oriented to person, place, and time. She appears well-developed and well-nourished. No distress.  HENT:  Head: Normocephalic and atraumatic.  Eyes: Conjunctivae are normal. Pupils are equal, round, and reactive to light.  Neck: Normal range of motion.  Cardiovascular: Normal rate, regular rhythm, normal heart sounds and intact distal pulses.  Pulses:      Radial pulses are 2+ on the right  side, and 2+ on the left side.       Dorsalis pedis pulses are 2+ on the right side, and 2+ on the left side.       Posterior tibial pulses are 2+ on the right side, and 2+ on the left side.  Pulmonary/Chest: Effort normal.  Musculoskeletal: Normal range of motion. She exhibits no edema.  Neurological: She is alert and oriented to person, place, and time.  Skin: Skin is warm and dry. She is not diaphoretic.  Psychiatric: She has a normal mood and affect. Her behavior is normal. Judgment and thought content normal.  Vitals reviewed.  BP 111/72 (BP Location: Right Arm)   Pulse 82   Resp 18   Wt 192 lb (87.1 kg)   BMI 30.07 kg/m   Past Medical History:  Diagnosis Date  . Allergy   . Anxiety   . DVT (deep venous thrombosis) ([place]) 07/13/2017   RLE  . Dysrhythmia   . Fibromyalgia    everywhere  . GERD (gastroesophageal reflux disease)   . Hernia, umbilical   . Heterozygous MTHFR mutation C677T ([place])   . IBS (irritable bowel syndrome)   . Migraine with aura    bc powders is only thing that works.  . Ovarian cyst   . Spina bifida occulta   . SVT (supraventricular tachycardia) ([facility])    Social History   Socioeconomic History  . Marital status: Single    Spouse name: Not on  file  . Number of children: Not on file  . Years of education: Not on file  . Highest education level: Not on file  Social Needs  . Financial resource strain: Not on file  . Food insecurity - worry: Not on file  . Food insecurity - inability: Not on file  . Transportation needs - medical: Not on file  . Transportation needs - non-medical: Not on file  Occupational History  . Not on file  Tobacco Use  . Smoking status: Current Every Day Smoker    Packs/day: 0.50    Years: 3.00    Pack years: 1.50    Types: Cigarettes  . Smokeless tobacco: Never Used  Substance and Sexual Activity  . Alcohol use: No    Alcohol/week: 0.0 oz  . Drug use: No  . Sexual activity: No  Other Topics Concern  . Not on  file  Social History Narrative  . Not on file   Past Surgical History:  Procedure Laterality Date  . APPENDECTOMY  2015  . CESAREAN SECTION  2011  . CHOLECYSTECTOMY  2005  . HERNIA REPAIR  0354   Umbilical Hernia Repair with mesh  . KNEE ARTHROSCOPY Left 2015   X 3. plate and pin in left shin placed after several surgeries. last surgery for this was 20154  . ORIF ANKLE FRACTURE Right 06/20/2017   Procedure: OPEN REDUCTION INTERNAL FIXATION (ORIF) TRIMALLEOLAR ANKLE FRACTURE LATERAL LIGAMENT RECONSTRUCTION;  Surgeon: Thornton Park, MD;  Location: [facility];  Service: Orthopedics;  Laterality: Right;  . OVARIAN CYST SURGERY    . UMBILICAL HERNIA REPAIR N/A 10/09/2016   Procedure: HERNIA REPAIR UMBILICAL ADULT;  Surgeon: Olean Ree, MD;  Location: [facility];  Service: General;  Laterality: N/A;   Family History  Problem Relation Age of Onset  . Arthritis Mother   . Hyperlipidemia Mother   . Hypertension Mother   . Migraines Mother   . Arthritis Father   . Autism Son   . Cancer Maternal Grandfather        pancreatic  . Stroke Maternal Grandfather   . Diabetes Paternal Grandmother   . Graves' disease Maternal Aunt    Allergies  Allergen Reactions  . Codeine     Other reaction(s): Other (See Comments) Passes out  . Latex Itching and Rash    Lips itching  . Morphine Itching and Shortness Of Breath  . Nalbuphine Itching and Shortness Of Breath  . Ondansetron Hcl Hives and Rash  . Other Hives    Allergic to steri strips. tegaderm ok.. Paper tape is ok. Clear plastic tape rips off skin  . Penicillins Rash    Patient reports severe rash and itching. Has patient had a PCN reaction causing immediate rash, facial/tongue/throat swelling, SOB or lightheadedness with hypotension: Yes Has patient had a PCN reaction causing severe rash involving mucus membranes or skin necrosis: No Has patient had a PCN reaction that required hospitalization unknown Has patient had a PCN reaction  occurring within the last 10 years:  unknown If all of the above answers are "NO", then may proceed  . Codeine Sulfate Other (See Comments)    fainting  . Metoclopramide Anxiety    Other reaction(s): Other (See Comments) "jittery" Difficulty breathing  . Prochlorperazine Anxiety and Other (See Comments)    Difficulty breathing  . Prochlorperazine Edisylate Anxiety  . Promethazine Anxiety and Other (See Comments)    "jittery" Difficulty breathing     . Tape     See above .Marland KitchenMarland Kitchen  Paper tape ok  . Tizanidine Other (See Comments)    Worsens headache      Assessment & Plan:  The patient presents for a 3-month DVT follow-up.  The patient presents today without complaint.  The patient denies any right lower extremity pain or swelling.  Patient denies any shortness of breath or chest pain.  CT of the chest on August 03, 2017 was notable for resolvement of previous PEs.  The patient is currently not taking Eliquis.  I am unsure as to who stopped this medication.  The patient is not on any anticoagulation such as aspirin or Plavix either.  The patient underwent a right lower extremity DVT ultrasound which was notable for no evidence of deep vein thrombosis to the right lower extremity.  The patient was seen with her mother who notes that she has 2 "clotting disorders" and gets DVTs and PEs all the time.  The patient notes that she has never been seen by a hematologist for a further workup.  The patient denies any fever, nausea vomiting.  1. Acute deep vein thrombosis (DVT) of popliteal vein of right lower extremity ([facility]CC) - Stable Patient presents today for 52-month DVT follow-up CT of the chest in December 2018 with bilateral PE resolving No DVT seen to the right lower extremity on duplex today Patient has already stopped taking her Eliquis unsure as to who told her to stop this. Patient is not on any type of a oral anticoagulation at this time Patient with a mother who has 2 clotting disorders  and has never been seen by a hematologist We will refer the patient to hematology for possible further workup The patient can follow-up with [place] as needed  - Ambulatory referral to Hematology  2. Heterozygous MTHFR mutation C677T ([place]) - Stable The patient's mother has this gene mutation The patient states that she has never been seen by hematology May benefit her to be seen by hematology for second opinion  3. Other acute pulmonary embolism without acute cor pulmonale (HCC) - Stable CT of the chest in December 2018 with no PE  Current Outpatient Medications on File Prior to Visit  Medication Sig Dispense Refill  . albuterol (PROVENTIL HFA;VENTOLIN HFA) 108 (90 Base) MCG/ACT inhaler Inhale 2 puffs into the lungs every 6 (six) hours as needed for wheezing or shortness of breath. 1 Inhaler 0  . albuterol (PROVENTIL) (2.5 MG/3ML) 0.083% nebulizer solution Take 3 mLs (2.5 mg total) by nebulization every 6 (six) hours as needed for wheezing or shortness of breath. 75 mL 12  . cyclobenzaprine (FLEXERIL) 10 MG tablet Take 1 tablet (10 mg total) by mouth 3 (three) times daily as needed for muscle spasms. 30 tablet 0  . LORazepam (ATIVAN) 0.5 MG tablet Take 1 tablet (0.5 mg total) by mouth 2 (two) times daily as needed for anxiety. 60 tablet 0  . metoprolol succinate (TOPROL-XL) 25 MG 24 hr tablet TAKE 1 AND 1/2 TABLETS(37.5 MG) BY MOUTH DAILY 45 tablet 0  . polyethylene glycol (MIRALAX / GLYCOLAX) packet Take 17 g by mouth 2 (two) times daily as needed for mild constipation.     . pregabalin (LYRICA) 150 MG capsule Take 1 capsule (150 mg total) by mouth 2 (two) times daily. 180 capsule 1  . ranitidine (ZANTAC) 150 MG tablet Take 1 tablet (150 mg total) by mouth daily as needed for heartburn. 90 tablet 3  . sucralfate (CARAFATE) 1 g tablet Take 1 tablet by mouth every 6 (six) hours as  needed.    . venlafaxine XR (EFFEXOR XR) 37.5 MG 24 hr capsule Take 1 capsule (37.5 mg total) by mouth 2 (two) times  daily. 60 capsule 5  . HYDROcodone-acetaminophen (NORCO/VICODIN) 5-325 MG tablet hydrocodone 5 mg-acetaminophen 325 mg tablet  TK 1 T PO Q 4 H    . predniSONE (DELTASONE) 5 MG tablet prednisone 5 mg tablets in a dose pack  TK PO UTD    . traMADol (ULTRAM) 50 MG tablet TK 1 T PO  Q 6 H PRN P  0   Current Facility-Administered Medications on File Prior to Visit  Medication Dose Route Frequency Provider Last Rate Last Dose  . medroxyPROGESTERone (DEPO-PROVERA) injection 150 mg  150 mg Intramuscular Q90 days Wynetta Emery, Megan P, DO   150 mg at 04/13/17 1112   There are no Patient Instructions on file for this visit. No Follow-up on file.  KIMBERLY A STEGMAYER, PA-C

## 2017-11-08 ENCOUNTER — Encounter: Payer: Self-pay | Admitting: Oncology

## 2017-11-08 ENCOUNTER — Inpatient Hospital Stay: Payer: Medicaid Other | Attending: Oncology | Admitting: Oncology

## 2017-11-08 ENCOUNTER — Inpatient Hospital Stay: Payer: Medicaid Other

## 2017-11-08 VITALS — BP 120/83 | HR 83 | Temp 98.8°F | Resp 16 | Wt 189.6 lb

## 2017-11-08 DIAGNOSIS — R5381 Other malaise: Secondary | ICD-10-CM | POA: Diagnosis not present

## 2017-11-08 DIAGNOSIS — F17210 Nicotine dependence, cigarettes, uncomplicated: Secondary | ICD-10-CM

## 2017-11-08 DIAGNOSIS — E041 Nontoxic single thyroid nodule: Secondary | ICD-10-CM

## 2017-11-08 DIAGNOSIS — I2782 Chronic pulmonary embolism: Secondary | ICD-10-CM

## 2017-11-08 DIAGNOSIS — Z86718 Personal history of other venous thrombosis and embolism: Secondary | ICD-10-CM | POA: Diagnosis not present

## 2017-11-08 DIAGNOSIS — R5383 Other fatigue: Secondary | ICD-10-CM

## 2017-11-08 LAB — ANTITHROMBIN III: ANTITHROMB III FUNC: 94 % (ref 75–120)

## 2017-11-08 NOTE — Progress Notes (Signed)
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 She has 2 children and their delivery was uneventful without any post partum DVT/PE. Her mother has had 3 episodes of DVTs/PE so far. Last one was in 2014. Mother was found to be homozygous for MTHFR and possible anti phospholipid antibody syndrome and has been off anticoagulation since 2016. Patient is a current smoker and smokes about 1/2 pack per day and has done so for several years. Was on Depo shots for PCOS for several years until august 2018  Last seen by me in jan 2018 for surgical clearance prior to umbilical hernia repair given h/o MTHFR mutation. In October 2018 she sprained her ankle while walking and had ankle fracture. She had to undergo surgery for the same. Couple of weeks post surgery she complained of pain and swelling in her right leg.  Doppler showed acute occlusive DVT of the right distal popliteal vein.  She was started on eliquis at that time.  She then presented to the ER with symptoms of shortness of breath and underwent CT chest on 07/17/2017 which showed right-sided pulmonary embolus involving right upper lobar and segmental pulmonary arteries.  No evidence of right heart strain.  11 mm left thyroid nodule.  She again presented to the ER on 08/03/2017 for symptoms of shortness  of breath and underwent a CT chest along with ultrasound of her right leg which did not reveal any DVT or PE.  Patient took Eliquis for about 4 months and recently stopped taking it 3 weeks ago.  She has also not gone back to Depo shots.    Interval history- reports no leg pain but does have mild swelling  ECOG PS- 0 Pain scale- 0   Review of systems- Review of Systems  Constitutional: Positive for malaise/fatigue. Negative for chills, fever and weight loss.  HENT: Negative for congestion, ear discharge and nosebleeds.   Eyes: Negative for blurred vision.  Respiratory: Negative for cough, hemoptysis, sputum production, shortness of breath and wheezing.   Cardiovascular: Negative for chest pain, palpitations, orthopnea and claudication.  Gastrointestinal: Negative for abdominal pain, blood in stool, constipation, diarrhea, heartburn, melena, nausea and vomiting.  Genitourinary: Negative for dysuria, flank pain, frequency, hematuria and urgency.  Musculoskeletal: Negative for back pain, joint pain and myalgias.  Skin: Negative for rash.  Neurological: Negative for dizziness, tingling, focal weakness, seizures, weakness and headaches.  Endo/Heme/Allergies: Does not bruise/bleed easily.  Psychiatric/Behavioral: Negative for depression and suicidal ideas. The patient does not have insomnia.        Allergies  Allergen Reactions  . Codeine     Other reaction(s): Other (See Comments) Passes out  . Latex Itching and Rash    Lips itching  . Morphine Itching and Shortness Of Breath  . Nalbuphine Itching and Shortness Of Breath  . Ondansetron Hcl Hives and Rash  . Other Hives  Allergic to steri strips. tegaderm ok.. Paper tape is ok. Clear plastic tape rips off skin  . Penicillins Rash    Patient reports severe rash and itching. Has patient had a PCN reaction causing immediate rash, facial/tongue/throat swelling, SOB or lightheadedness with hypotension: Yes Has patient had a PCN  reaction causing severe rash involving mucus membranes or skin necrosis: No Has patient had a PCN reaction that required hospitalization unknown Has patient had a PCN reaction occurring within the last 10 years:  unknown If all of the above answers are "NO", then may proceed  . Codeine Sulfate Other (See Comments)    fainting  . Metoclopramide Anxiety    Other reaction(s): Other (See Comments) "jittery" Difficulty breathing  . Prochlorperazine Anxiety and Other (See Comments)    Difficulty breathing  . Prochlorperazine Edisylate Anxiety  . Promethazine Anxiety and Other (See Comments)    "jittery" Difficulty breathing     . Tape     See above ... Paper tape ok  . Tizanidine Other (See Comments)    Worsens headache     Past Medical History:  Diagnosis Date  . Allergy   . Anxiety   . DVT (deep venous thrombosis) ([place]) 07/13/2017   RLE  . Dysrhythmia   . Fibromyalgia    everywhere  . GERD (gastroesophageal reflux disease)   . Hernia, umbilical   . Heterozygous MTHFR mutation [id number] ([place])   . IBS (irritable bowel syndrome)   . Migraine with aura    bc powders is only thing that works.  . Ovarian cyst   . Spina bifida occulta   . SVT (supraventricular tachycardia) ([facility])      Past Surgical History:  Procedure Laterality Date  . APPENDECTOMY  2015  . CESAREAN SECTION  2011  . CHOLECYSTECTOMY  2005  . HERNIA REPAIR  6629   Umbilical Hernia Repair with mesh  . KNEE ARTHROSCOPY Left 2015   X 3. plate and pin in left shin placed after several surgeries. last surgery for this was 20154  . ORIF ANKLE FRACTURE Right 06/20/2017   Procedure: OPEN REDUCTION INTERNAL FIXATION (ORIF) TRIMALLEOLAR ANKLE FRACTURE LATERAL LIGAMENT RECONSTRUCTION;  Surgeon: Thornton Park, MD;  Location: [facility] ORS;  Service: Orthopedics;  Laterality: Right;  . OVARIAN CYST SURGERY    . UMBILICAL HERNIA REPAIR N/A 10/09/2016   Procedure: HERNIA REPAIR UMBILICAL ADULT;  Surgeon: Olean Ree, MD;   Location: [facility] ORS;  Service: General;  Laterality: N/A;    Social History   Socioeconomic History  . Marital status: Single    Spouse name: Not on file  . Number of children: Not on file  . Years of education: Not on file  . Highest education level: Not on file  Social Needs  . Financial resource strain: Not on file  . Food insecurity - worry: Not on file  . Food insecurity - inability: Not on file  . Transportation needs - medical: Not on file  . Transportation needs - non-medical: Not on file  Occupational History  . Not on file  Tobacco Use  . Smoking status: Current Every Day Smoker    Packs/day: 0.50    Years: 3.00    Pack years: 1.50    Types: Cigarettes  . Smokeless tobacco: Never Used  Substance and Sexual Activity  . Alcohol use: No    Alcohol/week: 0.0 oz  . Drug use: No  . Sexual activity: No  Other Topics Concern  . Not on file  Social History Narrative  .  Not on file    Family History  Problem Relation Age of Onset  . Arthritis Mother   . Hyperlipidemia Mother   . Hypertension Mother   . Migraines Mother   . Arthritis Father   . Autism Son   . Cancer Maternal Grandfather        pancreatic  . Stroke Maternal Grandfather   . Diabetes Paternal Grandmother   . Graves' disease Maternal Aunt      Current Outpatient Medications:  .  albuterol (PROVENTIL HFA;VENTOLIN HFA) 108 (90 Base) MCG/ACT inhaler, Inhale 2 puffs into the lungs every 6 (six) hours as needed for wheezing or shortness of breath., Disp: 1 Inhaler, Rfl: 0 .  cyclobenzaprine (FLEXERIL) 10 MG tablet, Take 1 tablet (10 mg total) by mouth 3 (three) times daily as needed for muscle spasms., Disp: 30 tablet, Rfl: 0 .  LORazepam (ATIVAN) 0.5 MG tablet, Take 1 tablet (0.5 mg total) by mouth 2 (two) times daily as needed for anxiety., Disp: 60 tablet, Rfl: 0 .  metoprolol succinate (TOPROL-XL) 25 MG 24 hr tablet, TAKE 1 AND 1/2 TABLETS(37.5 MG) BY MOUTH DAILY, Disp: 45 tablet, Rfl: 0 .   polyethylene glycol (MIRALAX / GLYCOLAX) packet, Take 17 g by mouth 2 (two) times daily as needed for mild constipation. , Disp: , Rfl:  .  pregabalin (LYRICA) 150 MG capsule, Take 1 capsule (150 mg total) by mouth 2 (two) times daily., Disp: 180 capsule, Rfl: 1 .  ranitidine (ZANTAC) 150 MG tablet, Take 1 tablet (150 mg total) by mouth daily as needed for heartburn., Disp: 90 tablet, Rfl: 3 .  venlafaxine XR (EFFEXOR XR) 37.5 MG 24 hr capsule, Take 1 capsule (37.5 mg total) by mouth 2 (two) times daily., Disp: 60 capsule, Rfl: 5  Current Facility-Administered Medications:  .  medroxyPROGESTERone (DEPO-PROVERA) injection 150 mg, 150 mg, Intramuscular, Q90 days, Johnson, Megan P, DO, 150 mg at 04/13/17 1112  Physical exam:  Vitals:   11/08/17 1028 11/08/17 1031  BP:  120/83  Pulse:  83  Resp:  16  Temp:  98.8 F (37.1 C)  TempSrc:  Tympanic  Weight: 189 lb 9.5 oz (86 kg)    Physical Exam  Constitutional: She is oriented to person, place, and time and well-developed, well-nourished, and in no distress.  HENT:  Head: Normocephalic and atraumatic.  Eyes: EOM are normal. Pupils are equal, round, and reactive to light.  Neck: Normal range of motion.  Cardiovascular: Normal rate, regular rhythm and normal heart sounds.  Pulmonary/Chest: Effort normal and breath sounds normal.  Abdominal: Soft. Bowel sounds are normal.  Musculoskeletal: She exhibits edema (trace right ankle edema).  Neurological: She is alert and oriented to person, place, and time.  Skin: Skin is warm and dry.     CMP Latest Ref Rng & Units 08/03/2017  Glucose 65 - 99 mg/dL 112(H)  BUN 6 - 20 mg/dL 13  Creatinine 0.44 - 1.00 mg/dL 0.71  Sodium 135 - 145 mmol/L 139  Potassium 3.5 - 5.1 mmol/L 3.7  Chloride 101 - 111 mmol/L 108  CO2 22 - 32 mmol/L 23  Calcium 8.9 - 10.3 mg/dL 9.0  Total Protein 6.0 - 8.5 g/dL -  Total Bilirubin 0.0 - 1.2 mg/dL -  Alkaline Phos 39 - 117 IU/L -  AST 0 - 40 IU/L -  ALT 0 - 32 IU/L -    CBC Latest Ref Rng & Units 08/03/2017  WBC 3.6 - 11.0 K/uL 9.1  Hemoglobin 12.0 - 16.0  g/dL 13.2  Hematocrit 35.0 - 47.0 % 39.0  Platelets 150 - 440 K/uL 216    No images are attached to the encounter.  US Thyroid  Result Date: 10/19/2017 CLINICAL DATA:  Left nodule noted on CT chest EXAM: THYROID ULTRASOUND TECHNIQUE: Ultrasound examination of the thyroid gland and adjacent soft tissues was performed. COMPARISON:  CT 08/03/2017 FINDINGS: Parenchymal Echotexture: Mildly heterogenous Isthmus: 0.3 cm thickness Right lobe:   4.2 x 1 x 1.5 cm Left lobe: 4.3 x 1.1 x 2 cm _________________________________________________________ Estimated total number of nodules >/= 1 cm: 1 Number of spongiform nodules >/=  2 cm not described below (TR1): 0 Number of mixed cystic and solid nodules >/= 1.5 cm not described below (TR2): 0 _________________________________________________________ Nodule # 1: Location: Left; Mid Maximum size: 1.5 cm; Other 2 dimensions: 1.3 x 1 cm Composition: solid/almost completely solid (2) Echogenicity: isoechoic (1) Shape: not taller-than-wide (0) Margins: smooth (0) Echogenic foci: none (0) ACR TI-RADS total points: 3. ACR TI-RADS risk category: TR3 (3 points). ACR TI-RADS recommendations: *Given size (>/= 1.5 - 2.4 cm) and appearance, a follow-up ultrasound in 1 year should be considered based on TI-RADS criteria. _________________________________________________________ IMPRESSION: 1. Normal-sized thyroid with solitary left nodule. Recommend 1 year follow-up surveillance ultrasound. The above is in keeping with the ACR TI-RADS recommendations - J Am Coll Radiol 2017;14:587-595. Electronically Signed   By: Lucrezia Europe M.D.   On: 10/19/2017 16:07     Assessment and plan- Patient is a 28 y.o. female with RLE DVT involving distal popliteal vein and PE in November 2018  Patient had ankle fracture and ankle surgery preceding her popliteal vein DVT and PE and this event was likely  provoked. Patients mother has a possible h/o APS which is not hereditary. MTHFR heterozygosity does not increase clotting risk and does not play a role in deciding thrombophilia management. Typically APS causes second trimester abortions and not 1st. Patient has had 2 prior pregnancies and delivery including C section without thrombotic complications  Given h/o DVT in mother and personal h/o DVT/PE I will proceed with hypercoagulable testing including factor V leiden, prothrombin gene mutation, protein C, proten S, antithrombin III as well as antiphospholipid antibody syndrome testing. I will see her back in 2 weeks time to discuss results of her test. She will remain off anticoagulation at this time as she completed 4 months of anticoagulation. Repeat doppler and CT did not reveal any DVT/PE.  I have strongly recommended that she should stop smoking and remain off birth control as those are reversible/ modifiable risk factors for recurrent DVT/PE   Visit Diagnosis 1. Other chronic pulmonary embolism without acute cor pulmonale (HCC)      Dr. Randa Evens, MD, MPH [facility] at [facility] Pager- [phone] 11/08/2017 1:34 PM

## 2017-11-09 LAB — CARDIOLIPIN ANTIBODIES, IGG, IGM, IGA: Anticardiolipin IgG: <9 GPL U/mL (ref 0–14)

## 2017-11-09 LAB — BETA-2-GLYCOPROTEIN I ABS, IGG/M/A
Beta-2 Glyco I IgG: <9 GPI IgG units (ref 0–20)
Beta-2-Glycoprotein I IgA: <9 GPI IgA units (ref 0–25)
Beta-2-Glycoprotein I IgM: <9 GPI IgM units (ref 0–32)

## 2017-11-09 LAB — PROTEIN C, TOTAL: Protein C, Total: 82 % (ref 60–150)

## 2017-11-10 LAB — HEXAGONAL PHASE PHOSPHOLIPID: Hex Phosph Neut Test: 3 s (ref 0–11)

## 2017-11-10 LAB — LUPUS ANTICOAGULANT
DPT CONFIRM RATIO: 1.18 ratio (ref 0.00–1.40)
DRVVT: 35.4 s (ref 0.0–47.0)
PTT LA: 31 s (ref 0.0–51.9)
THROMBIN TIME: 17.8 s (ref 0.0–23.0)
dPT: 43.1 s (ref 0.0–55.0)

## 2017-11-10 LAB — PROTEIN S PANEL
PROTEIN S AG TOTAL: 89 % (ref 60–150)
Protein S Activity: 85 % (ref 63–140)
Protein S Ag, Free: 87 % (ref 57–157)

## 2017-11-10 LAB — HEX PHASE PHOSPHOLIPID REFLEX

## 2017-11-12 LAB — FACTOR 5 LEIDEN

## 2017-11-15 LAB — PROTHROMBIN GENE MUTATION

## 2017-11-22 ENCOUNTER — Inpatient Hospital Stay: Payer: Medicaid Other | Admitting: Oncology

## 2017-11-26 ENCOUNTER — Telehealth: Payer: Self-pay | Admitting: Oncology

## 2017-11-26 ENCOUNTER — Inpatient Hospital Stay: Payer: Medicaid Other | Admitting: Oncology

## 2017-11-26 NOTE — Telephone Encounter (Signed)
Called patient and rschd 11/26/17 [facility] MD appt. Conf with patient for 12/03/17. MF

## 2017-12-03 ENCOUNTER — Inpatient Hospital Stay: Payer: Medicaid Other | Attending: Oncology | Admitting: Oncology

## 2017-12-03 VITALS — BP 117/80 | HR 85 | Temp 97.8°F | Resp 18 | Ht 67.0 in | Wt 192.2 lb

## 2017-12-03 DIAGNOSIS — E7212 Methylenetetrahydrofolate reductase deficiency: Secondary | ICD-10-CM | POA: Insufficient documentation

## 2017-12-03 DIAGNOSIS — Z8759 Personal history of other complications of pregnancy, childbirth and the puerperium: Secondary | ICD-10-CM | POA: Insufficient documentation

## 2017-12-03 DIAGNOSIS — I825Y2 Chronic embolism and thrombosis of unspecified deep veins of left proximal lower extremity: Secondary | ICD-10-CM | POA: Diagnosis not present

## 2017-12-03 DIAGNOSIS — D821 Di George's syndrome: Secondary | ICD-10-CM | POA: Insufficient documentation

## 2017-12-03 DIAGNOSIS — F17210 Nicotine dependence, cigarettes, uncomplicated: Secondary | ICD-10-CM | POA: Insufficient documentation

## 2017-12-03 NOTE — Progress Notes (Signed)
No new changes noted today 

## 2017-12-06 NOTE — Progress Notes (Signed)
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 She has 2 children and their delivery was uneventful without any post partum DVT/PE. Her mother has had 3 episodes of DVTs/PE so far. Last one was in 2014. Mother was found to be homozygous for MTHFR and possible anti phospholipid antibody syndrome and has been off anticoagulation since 2016. Patient is a current smoker and smokes about 1/2 pack per day and has done so for several years. Was on Depo shots for PCOS for several years until august 2018  Last seen by me in jan 2018 for surgical clearance prior to umbilical hernia repair given h/o MTHFR mutation. In October 2018 she sprained her ankle while walking and had ankle fracture. She had to undergo surgery for the same. Couple of weeks post surgery she complained of pain and swelling in her right leg.  Doppler showed acute occlusive DVT of the right distal popliteal vein.  She was started on eliquis at that time.  She then presented to the ER with symptoms of shortness of breath and underwent CT chest on 07/17/2017 which showed right-sided pulmonary embolus involving right upper lobar and segmental pulmonary arteries.  No evidence of right heart strain.  11 mm left thyroid nodule.  She again presented to the ER on 08/03/2017 for symptoms of shortness of breath  and underwent a CT chest along with ultrasound of her right leg which did not reveal any DVT or PE.  Patient took Eliquis for about 4 months and then stopped it.  She has also not gone back to Depo shots.   18 2019 were as follows: Factor V light and prothrombin gene testing was negative.  Protein C, protein S and Antithrombin III levels were normal.  Testing for antiphospholipid antibody syndrome including lupus anticoagulant, beta 2 glycoprotein, cardiolipin antibody and hex phase was normal.  Interval history- she is trying to cut down on her smoking. She remains off irth control.  ECOG PS- 0 Pain scale- 0   Review of systems- Review of Systems  Constitutional: Negative for chills, fever, malaise/fatigue and weight loss.  HENT: Negative for congestion, ear discharge and nosebleeds.   Eyes: Negative for blurred vision.  Respiratory: Negative for cough, hemoptysis, sputum production, shortness of breath and wheezing.   Cardiovascular: Negative for chest pain, palpitations, orthopnea and claudication.  Gastrointestinal: Negative for abdominal pain, blood in stool, constipation, diarrhea, heartburn, melena, nausea and vomiting.  Genitourinary: Negative for dysuria, flank pain, frequency, hematuria and urgency.  Musculoskeletal: Negative for back pain, joint pain and myalgias.  Skin: Negative for rash.  Neurological: Negative for dizziness, tingling, focal weakness, seizures, weakness and headaches.  Endo/Heme/Allergies: Does not bruise/bleed easily.  Psychiatric/Behavioral: Negative for depression and suicidal ideas. The patient does not have insomnia.       Allergies  Allergen Reactions  . Codeine     Other reaction(s): Other (See Comments) Passes  out  . Latex Itching and Rash    Lips itching  . Morphine Itching and Shortness Of Breath  . Nalbuphine Itching and Shortness Of Breath  . Ondansetron Hcl Hives and Rash  . Other Hives    Allergic to steri strips. tegaderm ok.. Paper  tape is ok. Clear plastic tape rips off skin  . Penicillins Rash    Patient reports severe rash and itching. Has patient had a PCN reaction causing immediate rash, facial/tongue/throat swelling, SOB or lightheadedness with hypotension: Yes Has patient had a PCN reaction causing severe rash involving mucus membranes or skin necrosis: No Has patient had a PCN reaction that required hospitalization unknown Has patient had a PCN reaction occurring within the last 10 years:  unknown If all of the above answers are "NO", then may proceed  . Codeine Sulfate Other (See Comments)    fainting  . Metoclopramide Anxiety    Other reaction(s): Other (See Comments) "jittery" Difficulty breathing  . Prochlorperazine Anxiety and Other (See Comments)    Difficulty breathing  . Prochlorperazine Edisylate Anxiety  . Promethazine Anxiety and Other (See Comments)    "jittery" Difficulty breathing     . Tape     See above ... Paper tape ok  . Tizanidine Other (See Comments)    Worsens headache     Past Medical History:  Diagnosis Date  . Allergy   . Anxiety   . DVT (deep venous thrombosis) ([place]) 07/13/2017   RLE  . Dysrhythmia   . Fibromyalgia    everywhere  . GERD (gastroesophageal reflux disease)   . Hernia, umbilical   . Heterozygous MTHFR mutation C677T ([place])   . IBS (irritable bowel syndrome)   . Migraine with aura    bc powders is only thing that works.  . Ovarian cyst   . Spina bifida occulta   . SVT (supraventricular tachycardia) ([facility])      Past Surgical History:  Procedure Laterality Date  . APPENDECTOMY  2015  . CESAREAN SECTION  2011  . CHOLECYSTECTOMY  2005  . HERNIA REPAIR  9485   Umbilical Hernia Repair with mesh  . KNEE ARTHROSCOPY Left 2015   X 3. plate and pin in left shin placed after several surgeries. last surgery for this was 20154  . ORIF ANKLE FRACTURE Right 06/20/2017   Procedure: OPEN REDUCTION INTERNAL FIXATION (ORIF) TRIMALLEOLAR ANKLE FRACTURE LATERAL  LIGAMENT RECONSTRUCTION;  Surgeon: Thornton Park, MD;  Location: [facility] ORS;  Service: Orthopedics;  Laterality: Right;  . OVARIAN CYST SURGERY    . UMBILICAL HERNIA REPAIR N/A 10/09/2016   Procedure: HERNIA REPAIR UMBILICAL ADULT;  Surgeon: Olean Ree, MD;  Location: [facility] ORS;  Service: General;  Laterality: N/A;    Social History   Socioeconomic History  . Marital status: Single    Spouse name: Not on file  . Number of children: Not on file  . Years of education: Not on file  . Highest education level: Not on file  Occupational History  . Not on file  Social Needs  . Financial resource strain: Not on file  . Food insecurity:    Worry: Not on file    Inability: Not on file  . Transportation needs:    Medical: Not on file    Non-medical: Not on file  Tobacco Use  . Smoking status: Current Every Day Smoker    Packs/day: 0.50    Years: 3.00    Pack years: 1.50    Types: Cigarettes  . Smokeless  tobacco: Never Used  Substance and Sexual Activity  . Alcohol use: No    Alcohol/week: 0.0 oz  . Drug use: No  . Sexual activity: Never  Lifestyle  . Physical activity:    Days per week: Not on file    Minutes per session: Not on file  . Stress: Not on file  Relationships  . Social connections:    Talks on phone: Not on file    Gets together: Not on file    Attends religious service: Not on file    Active member of club or organization: Not on file    Attends meetings of clubs or organizations: Not on file    Relationship status: Not on file  . Intimate partner violence:    Fear of current or ex partner: Not on file    Emotionally abused: Not on file    Physically abused: Not on file    Forced sexual activity: Not on file  Other Topics Concern  . Not on file  Social History Narrative  . Not on file    Family History  Problem Relation Age of Onset  . Arthritis Mother   . Hyperlipidemia Mother   . Hypertension Mother   . Migraines Mother   . Arthritis Father   .  Autism Son   . Cancer Maternal Grandfather        pancreatic  . Stroke Maternal Grandfather   . Diabetes Paternal Grandmother   . Graves' disease Maternal Aunt      Current Outpatient Medications:  .  metoprolol succinate (TOPROL-XL) 25 MG 24 hr tablet, TAKE 1 AND 1/2 TABLETS(37.5 MG) BY MOUTH DAILY, Disp: 45 tablet, Rfl: 0 .  pregabalin (LYRICA) 150 MG capsule, Take 1 capsule (150 mg total) by mouth 2 (two) times daily., Disp: 180 capsule, Rfl: 1 .  ranitidine (ZANTAC) 150 MG tablet, Take 1 tablet (150 mg total) by mouth daily as needed for heartburn., Disp: 90 tablet, Rfl: 3 .  venlafaxine XR (EFFEXOR XR) 37.5 MG 24 hr capsule, Take 1 capsule (37.5 mg total) by mouth 2 (two) times daily., Disp: 60 capsule, Rfl: 5 .  albuterol (PROVENTIL HFA;VENTOLIN HFA) 108 (90 Base) MCG/ACT inhaler, Inhale 2 puffs into the lungs every 6 (six) hours as needed for wheezing or shortness of breath. (Patient not taking: Reported on 12/03/2017), Disp: 1 Inhaler, Rfl: 0 .  cyclobenzaprine (FLEXERIL) 10 MG tablet, Take 1 tablet (10 mg total) by mouth 3 (three) times daily as needed for muscle spasms. (Patient not taking: Reported on 12/03/2017), Disp: 30 tablet, Rfl: 0 .  LORazepam (ATIVAN) 0.5 MG tablet, Take 1 tablet (0.5 mg total) by mouth 2 (two) times daily as needed for anxiety. (Patient not taking: Reported on 12/03/2017), Disp: 60 tablet, Rfl: 0 .  polyethylene glycol (MIRALAX / GLYCOLAX) packet, Take 17 g by mouth 2 (two) times daily as needed for mild constipation. , Disp: , Rfl:   Current Facility-Administered Medications:  .  medroxyPROGESTERone (DEPO-PROVERA) injection 150 mg, 150 mg, Intramuscular, Q90 days, Johnson, Megan P, DO, 150 mg at 04/13/17 1112  Physical exam:  Vitals:   12/03/17 1425  BP: 117/80  Pulse: 85  Resp: 18  Temp: 97.8 F (36.6 C)  TempSrc: Tympanic  SpO2: 98%  Weight: 192 lb 3.2 oz (87.2 kg)  Height: 5\' 7"  (1.702 m)   Physical Exam  Constitutional: She is oriented to  person, place, and time.  HENT:  Head: Normocephalic and atraumatic.  Eyes: Pupils are equal, round,  and reactive to light. EOM are normal.  Neck: Normal range of motion.  Cardiovascular: Normal rate, regular rhythm and normal heart sounds.  Pulmonary/Chest: Effort normal and breath sounds normal.  Abdominal: Soft. Bowel sounds are normal.  Musculoskeletal:  Left leg swollen as compared to right. This is improved as compared to prior  Neurological: She is alert and oriented to person, place, and time.  Skin: Skin is warm and dry.     CMP Latest Ref Rng & Units 08/03/2017  Glucose 65 - 99 mg/dL 112(H)  BUN 6 - 20 mg/dL 13  Creatinine 0.44 - 1.00 mg/dL 0.71  Sodium 135 - 145 mmol/L 139  Potassium 3.5 - 5.1 mmol/L 3.7  Chloride 101 - 111 mmol/L 108  CO2 22 - 32 mmol/L 23  Calcium 8.9 - 10.3 mg/dL 9.0  Total Protein 6.0 - 8.5 g/dL -  Total Bilirubin 0.0 - 1.2 mg/dL -  Alkaline Phos 39 - 117 IU/L -  AST 0 - 40 IU/L -  ALT 0 - 32 IU/L -   CBC Latest Ref Rng & Units 08/03/2017  WBC 3.6 - 11.0 K/uL 9.1  Hemoglobin 12.0 - 16.0 g/dL 13.2  Hematocrit 35.0 - 47.0 % 39.0  Platelets 150 - 440 K/uL 216     Assessment and plan- Patient is a 27 y.o. female with heterozgocity for MTHFR and LLE DVT  1. heterozygosity for MTHFR does not constitute thrombophilia and was not a risk factor for her LLE DVT  2. LLE DVT provoked secondary to trauma and surgery in the setting of smoking and contraceptive use. Patient has completed 4 months of anticoagulation and had stopped anticoagulation before her last visit with me. I would not restart that at this time. Hypercoagulable work up otherwise negative. Encouraged her to quit smoking all together which is a reversible risk factor. She should ideally stay off hormonal contraception. If she desires to get pregnant in the future, she should see a hematologist. otheriwse she does not require hematology follow up at this time   Visit Diagnosis 1. Chronic  deep vein thrombosis (DVT) of proximal vein of left lower extremity ([place])      Dr. Randa Evens, MD, MPH [facility] [id number] 12/06/2017 11:05 AM

## 2017-12-23 ENCOUNTER — Other Ambulatory Visit: Payer: Self-pay | Admitting: Family Medicine

## 2018-01-17 NOTE — Progress Notes (Signed)
BP 124/83 (BP Location: Right Arm, Patient Position: Sitting, Cuff Size: Large)   Pulse 79   Temp 98.7 F (37.1 C)   Ht 5' 7.1" (1.704 m)   Wt 188 lb 4 oz (85.4 kg)   SpO2 97%   BMI 29.40 kg/m    Subjective:    Patient ID: Ellen Jackson, female    DOB: 18-Apr-1991, 27 y.o.   MRN: [id number]  HPI: Ellen Jackson is a 27 y.o. female presenting on 01/18/2018 for comprehensive medical examination. Current medical complaints include:none  She currently lives with: kids  Menopausal Symptoms: no  Depression Screen done today and results listed below:  Depression screen [facility] 2/9 01/18/2018 07/02/2016 05/28/2016  Decreased Interest 1 1 1   Down, Depressed, Hopeless 1 1 2   PHQ - 2 Score 2 2 3   Altered sleeping 2 - 2  Tired, decreased energy 1 - 3  Change in appetite 0 - 2  Feeling bad or failure about yourself  1 - 2  Trouble concentrating 1 - 3  Moving slowly or fidgety/restless 1 - 2  Suicidal thoughts 0 - 0  PHQ-9 Score 8 - 17  Difficult doing work/chores Somewhat difficult - -    Past Medical History:  Past Medical History:  Diagnosis Date  . Allergy   . Anxiety   . DVT (deep venous thrombosis) ([place]) 07/13/2017   RLE  . Dysrhythmia   . Fibromyalgia    everywhere  . GERD (gastroesophageal reflux disease)   . Hernia, umbilical   . Heterozygous MTHFR mutation C677T ([place])   . IBS (irritable bowel syndrome)   . Migraine with aura    bc powders is only thing that works.  . Ovarian cyst   . Spina bifida occulta   . SVT (supraventricular tachycardia) [facility])     Surgical History:  Past Surgical History:  Procedure Laterality Date  . APPENDECTOMY  2015  . CESAREAN SECTION  2011  . CHOLECYSTECTOMY  2005  . HERNIA REPAIR  5631   Umbilical Hernia Repair with mesh  . KNEE ARTHROSCOPY Left 2015   X 3. plate and pin in left shin placed after several surgeries. last surgery for this was 20154  . ORIF ANKLE FRACTURE Right 06/20/2017   Procedure: OPEN REDUCTION INTERNAL FIXATION  (ORIF) TRIMALLEOLAR ANKLE FRACTURE LATERAL LIGAMENT RECONSTRUCTION;  Surgeon: Thornton Park, MD;  Location: [facility] ORS;  Service: Orthopedics;  Laterality: Right;  . OVARIAN CYST SURGERY    . UMBILICAL HERNIA REPAIR N/A 10/09/2016   Procedure: HERNIA REPAIR UMBILICAL ADULT;  Surgeon: Olean Ree, MD;  Location: [facility] ORS;  Service: General;  Laterality: N/A;    Medications:  Current Outpatient Medications on File Prior to Visit  Medication Sig  . albuterol (PROVENTIL HFA;VENTOLIN HFA) 108 (90 Base) MCG/ACT inhaler Inhale 2 puffs into the lungs every 6 (six) hours as needed for wheezing or shortness of breath. (Patient not taking: Reported on 12/03/2017)  . cyclobenzaprine (FLEXERIL) 10 MG tablet Take 1 tablet (10 mg total) by mouth 3 (three) times daily as needed for muscle spasms. (Patient not taking: Reported on 12/03/2017)  . LORazepam (ATIVAN) 0.5 MG tablet Take 1 tablet (0.5 mg total) by mouth 2 (two) times daily as needed for anxiety. (Patient not taking: Reported on 12/03/2017)  . metoprolol succinate (TOPROL-XL) 25 MG 24 hr tablet TAKE 1 AND 1/2 TABLETS(37.5 MG) BY MOUTH DAILY  . polyethylene glycol (MIRALAX / GLYCOLAX) packet Take 17 g by mouth 2 (two) times daily as needed for  mild constipation.   . pregabalin (LYRICA) 150 MG capsule Take 1 capsule (150 mg total) by mouth 2 (two) times daily.  . ranitidine (ZANTAC) 150 MG tablet Take 1 tablet (150 mg total) by mouth daily as needed for heartburn.  . venlafaxine XR (EFFEXOR-XR) 37.5 MG 24 hr capsule TAKE 1 CAPSULE(37.5 MG) BY MOUTH TWICE DAILY   Current Facility-Administered Medications on File Prior to Visit  Medication  . medroxyPROGESTERone (DEPO-PROVERA) injection 150 mg    Allergies:  Allergies  Allergen Reactions  . Codeine     Other reaction(s): Other (See Comments) Passes out  . Latex Itching and Rash    Lips itching  . Morphine Itching and Shortness Of Breath  . Nalbuphine Itching and Shortness Of Breath  . Ondansetron  Hcl Hives and Rash  . Other Hives    Allergic to steri strips. tegaderm ok.. Paper tape is ok. Clear plastic tape rips off skin  . Penicillins Rash    Patient reports severe rash and itching. Has patient had a PCN reaction causing immediate rash, facial/tongue/throat swelling, SOB or lightheadedness with hypotension: Yes Has patient had a PCN reaction causing severe rash involving mucus membranes or skin necrosis: No Has patient had a PCN reaction that required hospitalization unknown Has patient had a PCN reaction occurring within the last 10 years:  unknown If all of the above answers are "NO", then may proceed  . Codeine Sulfate Other (See Comments)    fainting  . Metoclopramide Anxiety    Other reaction(s): Other (See Comments) "jittery" Difficulty breathing  . Prochlorperazine Anxiety and Other (See Comments)    Difficulty breathing  . Prochlorperazine Edisylate Anxiety  . Promethazine Anxiety and Other (See Comments)    "jittery" Difficulty breathing     . Tape     See above ... Paper tape ok  . Tizanidine Other (See Comments)    Worsens headache    Social History:  Social History   Socioeconomic History  . Marital status: Single    Spouse name: Not on file  . Number of children: Not on file  . Years of education: Not on file  . Highest education level: Not on file  Occupational History  . Not on file  Social Needs  . Financial resource strain: Not on file  . Food insecurity:    Worry: Not on file    Inability: Not on file  . Transportation needs:    Medical: Not on file    Non-medical: Not on file  Tobacco Use  . Smoking status: Current Every Day Smoker    Packs/day: 0.50    Years: 3.00    Pack years: 1.50    Types: Cigarettes  . Smokeless tobacco: Never Used  Substance and Sexual Activity  . Alcohol use: No    Alcohol/week: 0.0 oz  . Drug use: No  . Sexual activity: Never  Lifestyle  . Physical activity:    Days per week: Not on file    Minutes  per session: Not on file  . Stress: Not on file  Relationships  . Social connections:    Talks on phone: Not on file    Gets together: Not on file    Attends religious service: Not on file    Active member of club or organization: Not on file    Attends meetings of clubs or organizations: Not on file    Relationship status: Not on file  . Intimate partner violence:    Fear of current  or ex partner: Not on file    Emotionally abused: Not on file    Physically abused: Not on file    Forced sexual activity: Not on file  Other Topics Concern  . Not on file  Social History Narrative  . Not on file   Social History   Tobacco Use  Smoking Status Current Every Day Smoker  . Packs/day: 0.50  . Years: 3.00  . Pack years: 1.50  . Types: Cigarettes  Smokeless Tobacco Never Used   Social History   Substance and Sexual Activity  Alcohol Use No  . Alcohol/week: 0.0 oz    Family History:  Family History  Problem Relation Age of Onset  . Arthritis Mother   . Hyperlipidemia Mother   . Hypertension Mother   . Migraines Mother   . Arthritis Father   . Autism Son   . Cancer Maternal Grandfather        pancreatic  . Stroke Maternal Grandfather   . Diabetes Paternal Grandmother   . Graves' disease Maternal Aunt     Past medical history, surgical history, medications, allergies, family history and social history reviewed with patient today and changes made to appropriate areas of the chart.   Review of Systems  Constitutional: Positive for diaphoresis. Negative for chills, fever, malaise/fatigue and weight loss.  HENT: Negative.   Eyes: Negative.   Respiratory: Positive for cough and wheezing. Negative for hemoptysis, sputum production and shortness of breath.   Cardiovascular: Negative.   Gastrointestinal: Negative.   Genitourinary: Negative.        LLQ pain, thinks that she has an ovarian cyst  Musculoskeletal: Negative.   Skin: Negative.  Negative for itching and rash.        Sebaceous cysts on her breasts that seem to be busting open  Neurological: Negative.   Endo/Heme/Allergies: Positive for environmental allergies and polydipsia. Bruises/bleeds easily.  Psychiatric/Behavioral: Negative.     All other ROS negative except what is listed above and in the HPI.      Objective:    BP 124/83 (BP Location: Right Arm, Patient Position: Sitting, Cuff Size: Large)   Pulse 79   Temp 98.7 F (37.1 C)   Ht 5' 7.1" (1.704 m)   Wt 188 lb 4 oz (85.4 kg)   SpO2 97%   BMI 29.40 kg/m   Wt Readings from Last 3 Encounters:  01/18/18 188 lb 4 oz (85.4 kg)  12/03/17 192 lb 3.2 oz (87.2 kg)  11/08/17 189 lb 9.5 oz (86 kg)    Physical Exam  Constitutional: She is oriented to person, place, and time. She appears well-developed and well-nourished. No distress.  HENT:  Head: Normocephalic and atraumatic.  Right Ear: Hearing, tympanic membrane, external ear and ear canal normal.  Left Ear: Hearing, tympanic membrane, external ear and ear canal normal.  Nose: Nose normal.  Mouth/Throat: Uvula is midline, oropharynx is clear and moist and mucous membranes are normal. No oropharyngeal exudate.  Eyes: Pupils are equal, round, and reactive to light. Conjunctivae, EOM and lids are normal. Right eye exhibits no discharge. Left eye exhibits no discharge. No scleral icterus.  Neck: Normal range of motion. Neck supple. No JVD present. No tracheal deviation present. No thyromegaly present.  Cardiovascular: Normal rate, regular rhythm, normal heart sounds and intact distal pulses. Exam reveals no gallop and no friction rub.  No murmur heard. Pulmonary/Chest: Effort normal and breath sounds normal. No stridor. No respiratory distress. She has no wheezes. She has no rales.  She exhibits no tenderness. Right breast exhibits no inverted nipple, no mass, no nipple discharge, no skin change and no tenderness. Left breast exhibits no inverted nipple, no mass, no nipple discharge, no skin  change and no tenderness. No breast swelling, tenderness, discharge or bleeding. Breasts are symmetrical.  Abdominal: Soft. Bowel sounds are normal. She exhibits no distension and no mass. There is tenderness (LLQ). There is no rebound and no guarding. No hernia.  Genitourinary:  Genitourinary Comments: Pelvic exam deferred with shared decision making  Musculoskeletal: Normal range of motion. She exhibits no edema, tenderness or deformity.  Lymphadenopathy:    She has no cervical adenopathy.  Neurological: She is alert and oriented to person, place, and time. She displays normal reflexes. No cranial nerve deficit or sensory deficit. She exhibits normal muscle tone. Coordination normal.  Skin: Skin is warm, dry and intact. Capillary refill takes less than 2 seconds. No rash noted. She is not diaphoretic. No erythema. No pallor.  Recurrent sebaceous cysts between her breasts  Psychiatric: She has a normal mood and affect. Her speech is normal and behavior is normal. Judgment and thought content normal. Cognition and memory are normal.  Nursing note and vitals reviewed.   Results for orders placed or performed in visit on 11/08/17  Factor 5 leiden  Result Value Ref Range   Recommendations-F5LEID: Comment   Prothrombin gene mutation  Result Value Ref Range   Recommendations-PTGENE: Comment   Antithrombin III  Result Value Ref Range   AntiThromb III Func 94 75 - 120 %  PROTEIN S PANEL  Result Value Ref Range   Protein S Ag, Total 89 60 - 150 %   Protein S Ag, Free 87 57 - 157 %   Protein S Activity 85 63 - 140 %  Protein C, total  Result Value Ref Range   Protein C, Total 82 60 - 150 %  Lupus anticoagulant  Result Value Ref Range   dPT 43.1 0.0 - 55.0 sec   dPT Confirm Ratio 1.18 0.00 - 1.40 Ratio   Thrombin Time 17.8 0.0 - 23.0 sec   PTT Lupus Anticoagulant 31.0 0.0 - 51.9 sec   DRVVT 35.4 0.0 - 47.0 sec   Lupus Anticoag Interp Comment:   Beta-2-glycoprotein i abs, IgG/M/A    Result Value Ref Range   Beta-2 Glyco I IgG <9 0 - 20 GPI IgG units   Beta-2-Glycoprotein I IgM <9 0 - 32 GPI IgM units   Beta-2-Glycoprotein I IgA <9 0 - 25 GPI IgA units  Cardiolipin antibodies, IgG, IgM, IgA  Result Value Ref Range   Anticardiolipin IgG <9 0 - 14 GPL U/mL   Anticardiolipin IgM <9 0 - 12 MPL U/mL   Anticardiolipin IgA <9 0 - 11 APL U/mL  Hexagonal Phase Phospholipid  Result Value Ref Range   Hex Phosph Neut Test 3 0 - 11 sec  Hex phase phospholipid reflex  Result Value Ref Range   Hex phase phospholipid comment Comment       Assessment & Plan:   Problem List Items Addressed This Visit    None    Visit Diagnoses    Routine general medical examination at a health care facility    -  Primary   Vaccines updated. Screening labs checked today. Pap up to date. Continue diet and exercise. Call with any concerns. Continue to monitor.    Relevant Orders   CBC with Differential/Platelet   Comprehensive metabolic panel   Lipid Panel w/o Chol/HDL  Ratio   TSH   UA/M w/rflx Culture, Routine   VITAMIN D 25 Hydroxy (Vit-D Deficiency, Fractures)   Sebaceous cyst       Referral to dermatology made today. Call with any concerns.    Relevant Orders   Ambulatory referral to Dermatology   Need for Streptococcus pneumoniae vaccination       Pneumovax given today. Call with any concerns.    Relevant Orders   Pneumococcal polysaccharide vaccine 23-valent greater than or equal to 2yo subcutaneous/IM (Completed)   LLQ pain       Will get her scheduled for an [place]- await results.    Relevant Orders   US Pelvis Complete   US Transvaginal Non-OB       Follow up plan: Return Thursday, for follow up.   LABORATORY TESTING:  - Pap smear: up to date  IMMUNIZATIONS:   - Tdap: Tetanus vaccination status reviewed: last tetanus booster within 10 years. - Influenza: Postponed to flu season - Pneumovax: Administered today - HPV: Up to date  PATIENT COUNSELING:   Advised to take  1 mg of folate supplement per day if capable of pregnancy.   Sexuality: Discussed sexually transmitted diseases, partner selection, use of condoms, avoidance of unintended pregnancy  and contraceptive alternatives.   Advised to avoid cigarette smoking.  I discussed with the patient that most people either abstain from alcohol or drink within safe limits (<=14/week and <=4 drinks/occasion for males, <=7/weeks and <= 3 drinks/occasion for females) and that the risk for alcohol disorders and other health effects rises proportionally with the number of drinks per week and how often a drinker exceeds daily limits.  Discussed cessation/primary prevention of drug use and availability of treatment for abuse.   Diet: Encouraged to adjust caloric intake to maintain  or achieve ideal body weight, to reduce intake of dietary saturated fat and total fat, to limit sodium intake by avoiding high sodium foods and not adding table salt, and to maintain adequate dietary potassium and calcium preferably from fresh fruits, vegetables, and low-fat dairy products.    stressed the importance of regular exercise  Injury prevention: Discussed safety belts, safety helmets, smoke detector, smoking near bedding or upholstery.   Dental health: Discussed importance of regular tooth brushing, flossing, and dental visits.    NEXT PREVENTATIVE PHYSICAL DUE IN 1 YEAR. Return Thursday, for follow up.

## 2018-01-18 ENCOUNTER — Encounter: Payer: Self-pay | Admitting: Family Medicine

## 2018-01-18 ENCOUNTER — Ambulatory Visit (INDEPENDENT_AMBULATORY_CARE_PROVIDER_SITE_OTHER): Payer: Medicaid Other | Admitting: Family Medicine

## 2018-01-18 VITALS — BP 124/83 | HR 79 | Temp 98.7°F | Ht 67.1 in | Wt 188.2 lb

## 2018-01-18 DIAGNOSIS — L723 Sebaceous cyst: Secondary | ICD-10-CM

## 2018-01-18 DIAGNOSIS — Z23 Encounter for immunization: Secondary | ICD-10-CM | POA: Diagnosis not present

## 2018-01-18 DIAGNOSIS — R1032 Left lower quadrant pain: Secondary | ICD-10-CM | POA: Diagnosis not present

## 2018-01-18 DIAGNOSIS — Z0000 Encounter for general adult medical examination without abnormal findings: Secondary | ICD-10-CM | POA: Diagnosis not present

## 2018-01-18 LAB — UA/M W/RFLX CULTURE, ROUTINE
BILIRUBIN UA: NEGATIVE
Glucose, UA: NEGATIVE
Ketones, UA: NEGATIVE
Leukocytes, UA: NEGATIVE
NITRITE UA: NEGATIVE
PH UA: 8.5 — AB (ref 5.0–7.5)
RBC UA: NEGATIVE
SPEC GRAV UA: 1.015 (ref 1.005–1.030)
UUROB: 1 mg/dL (ref 0.2–1.0)

## 2018-01-18 NOTE — Patient Instructions (Signed)
Health Maintenance, Female Adopting a healthy lifestyle and getting preventive care can go a long way to promote health and wellness. Talk with your health care provider about what schedule of regular examinations is right for you. This is a good chance for you to check in with your provider about disease prevention and staying healthy. In between checkups, there are plenty of things you can do on your own. Experts have done a lot of research about which lifestyle changes and preventive measures are most likely to keep you healthy. Ask your health care provider for more information. Weight and diet Eat a healthy diet  Be sure to include plenty of vegetables, fruits, low-fat dairy products, and lean protein.  Do not eat a lot of foods high in solid fats, added sugars, or salt.  Get regular exercise. This is one of the most important things you can do for your health. ? Most adults should exercise for at least 150 minutes each week. The exercise should increase your heart rate and make you sweat (moderate-intensity exercise). ? Most adults should also do strengthening exercises at least twice a week. This is in addition to the moderate-intensity exercise.  Maintain a healthy weight  Body mass index (BMI) is a measurement that can be used to identify possible weight problems. It estimates body fat based on height and weight. Your health care provider can help determine your BMI and help you achieve or maintain a healthy weight.  For females 20 years of age and older: ? A BMI below 18.5 is considered underweight. ? A BMI of 18.5 to 24.9 is normal. ? A BMI of 25 to 29.9 is considered overweight. ? A BMI of 30 and above is considered obese.  Watch levels of cholesterol and blood lipids  You should start having your blood tested for lipids and cholesterol at 29 years of age, then have this test every 5 years.  You may need to have your cholesterol levels checked more often if: ? Your lipid or  cholesterol levels are high. ? You are older than 29 years of age. ? You are at high risk for heart disease.  Cancer screening Lung Cancer  Lung cancer screening is recommended for adults 55-80 years old who are at high risk for lung cancer because of a history of smoking.  A yearly low-dose CT scan of the lungs is recommended for people who: ? Currently smoke. ? Have quit within the past 15 years. ? Have at least a 30-pack-year history of smoking. A pack year is smoking an average of one pack of cigarettes a day for 1 year.  Yearly screening should continue until it has been 15 years since you quit.  Yearly screening should stop if you develop a health problem that would prevent you from having lung cancer treatment.  Breast Cancer  Practice breast self-awareness. This means understanding how your breasts normally appear and feel.  It also means doing regular breast self-exams. Let your health care provider know about any changes, no matter how small.  If you are in your 20s or 30s, you should have a clinical breast exam (CBE) by a health care provider every 1-3 years as part of a regular health exam.  If you are 40 or older, have a CBE every year. Also consider having a breast X-ray (mammogram) every year.  If you have a family history of breast cancer, talk to your health care provider about genetic screening.  If you are at high risk   for breast cancer, talk to your health care provider about having an MRI and a mammogram every year.  Breast cancer gene (BRCA) assessment is recommended for women who have family members with BRCA-related cancers. BRCA-related cancers include: ? Breast. ? Ovarian. ? Tubal. ? Peritoneal cancers.  Results of the assessment will determine the need for genetic counseling and BRCA1 and BRCA2 testing.  Cervical Cancer Your health care provider may recommend that you be screened regularly for cancer of the pelvic organs (ovaries, uterus, and  vagina). This screening involves a pelvic examination, including checking for microscopic changes to the surface of your cervix (Pap test). You may be encouraged to have this screening done every 3 years, beginning at age 22.  For women ages 56-65, health care providers may recommend pelvic exams and Pap testing every 3 years, or they may recommend the Pap and pelvic exam, combined with testing for human papilloma virus (HPV), every 5 years. Some types of HPV increase your risk of cervical cancer. Testing for HPV may also be done on women of any age with unclear Pap test results.  Other health care providers may not recommend any screening for nonpregnant women who are considered low risk for pelvic cancer and who do not have symptoms. Ask your health care provider if a screening pelvic exam is right for you.  If you have had past treatment for cervical cancer or a condition that could lead to cancer, you need Pap tests and screening for cancer for at least 20 years after your treatment. If Pap tests have been discontinued, your risk factors (such as having a new sexual partner) need to be reassessed to determine if screening should resume. Some women have medical problems that increase the chance of getting cervical cancer. In these cases, your health care provider may recommend more frequent screening and Pap tests.  Colorectal Cancer  This type of cancer can be detected and often prevented.  Routine colorectal cancer screening usually begins at 29 years of age and continues through 29 years of age.  Your health care provider may recommend screening at an earlier age if you have risk factors for colon cancer.  Your health care provider may also recommend using home test kits to check for hidden blood in the stool.  A small camera at the end of a tube can be used to examine your colon directly (sigmoidoscopy or colonoscopy). This is done to check for the earliest forms of colorectal  cancer.  Routine screening usually begins at age 33.  Direct examination of the colon should be repeated every 5-10 years through 29 years of age. However, you may need to be screened more often if early forms of precancerous polyps or small growths are found.  Skin Cancer  Check your skin from head to toe regularly.  Tell your health care provider about any new moles or changes in moles, especially if there is a change in a mole's shape or color.  Also tell your health care provider if you have a mole that is larger than the size of a pencil eraser.  Always use sunscreen. Apply sunscreen liberally and repeatedly throughout the day.  Protect yourself by wearing long sleeves, pants, a wide-brimmed hat, and sunglasses whenever you are outside.  Heart disease, diabetes, and high blood pressure  High blood pressure causes heart disease and increases the risk of stroke. High blood pressure is more likely to develop in: ? People who have blood pressure in the high end of  the normal range (130-139/85-89 mm Hg). ? People who are overweight or obese. ? People who are African American.  If you are 21-29 years of age, have your blood pressure checked every 3-5 years. If you are 3 years of age or older, have your blood pressure checked every year. You should have your blood pressure measured twice-once when you are at a hospital or clinic, and once when you are not at a hospital or clinic. Record the average of the two measurements. To check your blood pressure when you are not at a hospital or clinic, you can use: ? An automated blood pressure machine at a pharmacy. ? A home blood pressure monitor.  If you are between 17 years and 37 years old, ask your health care provider if you should take aspirin to prevent strokes.  Have regular diabetes screenings. This involves taking a blood sample to check your fasting blood sugar level. ? If you are at a normal weight and have a low risk for diabetes,  have this test once every three years after 29 years of age. ? If you are overweight and have a high risk for diabetes, consider being tested at a younger age or more often. Preventing infection Hepatitis B  If you have a higher risk for hepatitis B, you should be screened for this virus. You are considered at high risk for hepatitis B if: ? You were born in a country where hepatitis B is common. Ask your health care provider which countries are considered high risk. ? Your parents were born in a high-risk country, and you have not been immunized against hepatitis B (hepatitis B vaccine). ? You have HIV or AIDS. ? You use needles to inject street drugs. ? You live with someone who has hepatitis B. ? You have had sex with someone who has hepatitis B. ? You get hemodialysis treatment. ? You take certain medicines for conditions, including cancer, organ transplantation, and autoimmune conditions.  Hepatitis C  Blood testing is recommended for: ? Everyone born from [birth year] through 1967. ? Anyone with known risk factors for hepatitis C.  Sexually transmitted infections (STIs)  You should be screened for sexually transmitted infections (STIs) including gonorrhea and chlamydia if: ? You are sexually active and are younger than 29 years of age. ? You are older than 29 years of age and your health care provider tells you that you are at risk for this type of infection. ? Your sexual activity has changed since you were last screened and you are at an increased risk for chlamydia or gonorrhea. Ask your health care provider if you are at risk.  If you do not have HIV, but are at risk, it may be recommended that you take a prescription medicine daily to prevent HIV infection. This is called pre-exposure prophylaxis (PrEP). You are considered at risk if: ? You are sexually active and do not regularly use condoms or know the HIV status of your partner(s). ? You take drugs by injection. ? You are  sexually active with a partner who has HIV.  Talk with your health care provider about whether you are at high risk of being infected with HIV. If you choose to begin PrEP, you should first be tested for HIV. You should then be tested every 3 months for as long as you are taking PrEP. Pregnancy  If you are premenopausal and you may become pregnant, ask your health care provider about preconception counseling.  If you may become  pregnant, take 400 to 800 micrograms (mcg) of folic acid every day.  If you want to prevent pregnancy, talk to your health care provider about birth control (contraception). Osteoporosis and menopause  Osteoporosis is a disease in which the bones lose minerals and strength with aging. This can result in serious bone fractures. Your risk for osteoporosis can be identified using a bone density scan.  If you are 100 years of age or older, or if you are at risk for osteoporosis and fractures, ask your health care provider if you should be screened.  Ask your health care provider whether you should take a calcium or vitamin D supplement to lower your risk for osteoporosis.  Menopause may have certain physical symptoms and risks.  Hormone replacement therapy may reduce some of these symptoms and risks. Talk to your health care provider about whether hormone replacement therapy is right for you. Follow these instructions at home:  Schedule regular health, dental, and eye exams.  Stay current with your immunizations.  Do not use any tobacco products including cigarettes, chewing tobacco, or electronic cigarettes.  If you are pregnant, do not drink alcohol.  If you are breastfeeding, limit how much and how often you drink alcohol.  Limit alcohol intake to no more than 1 drink per day for nonpregnant women. One drink equals 12 ounces of beer, 5 ounces of wine, or 1 ounces of hard liquor.  Do not use street drugs.  Do not share needles.  Ask your health care  provider for help if you need support or information about quitting drugs.  Tell your health care provider if you often feel depressed.  Tell your health care provider if you have ever been abused or do not feel safe at home. This information is not intended to replace advice given to you by your health care provider. Make sure you discuss any questions you have with your health care provider. Document Released: 02/23/2011 Document Revised: 01/16/2016 Document Reviewed: 05/14/2015 Elsevier Interactive Patient Education  2018 [employer]. Pneumococcal Polysaccharide Vaccine: What You Need to Know 1. Why get vaccinated? Vaccination can protect older adults (and some children and younger adults) from pneumococcal disease. Pneumococcal disease is caused by bacteria that can spread from person to person through close contact. It can cause ear infections, and it can also lead to more serious infections of the:  Lungs (pneumonia),  Blood (bacteremia), and  Covering of the brain and spinal cord (meningitis). Meningitis can cause deafness and brain damage, and it can be fatal.  Anyone can get pneumococcal disease, but children under 61 years of age, people with certain medical conditions, adults over 91 years of age, and cigarette smokers are at the highest risk. About 18,000 older adults die each year from pneumococcal disease in the [place]. Treatment of pneumococcal infections with penicillin and other drugs used to be more effective. But some strains of the disease have become resistant to these drugs. This makes prevention of the disease, through vaccination, even more important. 2. Pneumococcal polysaccharide vaccine (PPSV23) Pneumococcal polysaccharide vaccine (PPSV23) protects against 23 types of pneumococcal bacteria. It will not prevent all pneumococcal disease. PPSV23 is recommended for:  All adults 60 years of age and older,  Anyone 2 through 29 years of age with certain  long-term health problems,  Anyone 2 through 29 years of age with a weakened immune system,  Adults 109 through 29 years of age who smoke cigarettes or have asthma.  Most people need only one dose  of PPSV. A second dose is recommended for certain high-risk groups. People 39 and older should get a dose even if they have gotten one or more doses of the vaccine before they turned 65. Your healthcare provider can give you more information about these recommendations. Most healthy adults develop protection within 2 to 3 weeks of getting the shot. 3. Some people should not get this vaccine  Anyone who has had a life-threatening allergic reaction to PPSV should not get another dose.  Anyone who has a severe allergy to any component of PPSV should not receive it. Tell your provider if you have any severe allergies.  Anyone who is moderately or severely ill when the shot is scheduled may be asked to wait until they recover before getting the vaccine. Someone with a mild illness can usually be vaccinated.  Children less than 26 years of age should not receive this vaccine.  There is no evidence that PPSV is harmful to either a pregnant woman or to her fetus. However, as a precaution, women who need the vaccine should be vaccinated before becoming pregnant, if possible. 4. Risks of a vaccine reaction With any medicine, including vaccines, there is a chance of side effects. These are usually mild and go away on their own, but serious reactions are also possible. About half of people who get PPSV have mild side effects, such as redness or pain where the shot is given, which go away within about two days. Less than 1 out of 100 people develop a fever, muscle aches, or more severe local reactions. Problems that could happen after any vaccine:  People sometimes faint after a medical procedure, including vaccination. Sitting or lying down for about 15 minutes can help prevent fainting, and injuries caused by  a fall. Tell your doctor if you feel dizzy, or have vision changes or ringing in the ears.  Some people get severe pain in the shoulder and have difficulty moving the arm where a shot was given. This happens very rarely.  Any medication can cause a severe allergic reaction. Such reactions from a vaccine are very rare, estimated at about 1 in a million doses, and would happen within a few minutes to a few hours after the vaccination. As with any medicine, there is a very remote chance of a vaccine causing a serious injury or death. The safety of vaccines is always being monitored. For more information, visit: http://www.aguilar.org/ 5. What if there is a serious reaction? What should I look for? Look for anything that concerns you, such as signs of a severe allergic reaction, very high fever, or unusual behavior. Signs of a severe allergic reaction can include hives, swelling of the face and throat, difficulty breathing, a fast heartbeat, dizziness, and weakness. These would usually start a few minutes to a few hours after the vaccination. What should I do? If you think it is a severe allergic reaction or other emergency that can't wait, call 9-1-1 or get to the nearest hospital. Otherwise, call your doctor. Afterward, the reaction should be reported to the Vaccine Adverse Event Reporting System (VAERS). Your doctor might file this report, or you can do it yourself through the VAERS web site at www.vaers.SamedayNews.es, or by calling 862-732-0376. VAERS does not give medical advice. 6. How can I learn more?  Ask your doctor. He or she can give you the vaccine package insert or suggest other sources of information.  Call your local or state health department.  Contact the Centers for  Disease Control and Prevention (CDC): ? Call (416) 718-6497 (1-800-CDC-INFO) or ? Visit CDC's website at http://hunter.com/ CDC Pneumococcal Polysaccharide Vaccine VIS (12/15/13) This information is not intended to  replace advice given to you by your health care provider. Make sure you discuss any questions you have with your health care provider. Document Released: 06/07/2006 Document Revised: 04/30/2016 Document Reviewed: 04/30/2016 Elsevier Interactive Patient Education  2017 [employer].

## 2018-01-19 LAB — CBC WITH DIFFERENTIAL/PLATELET
BASOS: 0 %
Basophils Absolute: 0 10*3/uL (ref 0.0–0.2)
EOS (ABSOLUTE): 0.5 10*3/uL — ABNORMAL HIGH (ref 0.0–0.4)
EOS: 6 %
HEMATOCRIT: 38.8 % (ref 34.0–46.6)
HEMOGLOBIN: 12.7 g/dL (ref 11.1–15.9)
Immature Grans (Abs): 0 10*3/uL (ref 0.0–0.1)
Immature Granulocytes: 0 %
LYMPHS ABS: 1.7 10*3/uL (ref 0.7–3.1)
Lymphs: 23 %
MCH: 29.4 pg (ref 26.6–33.0)
MCHC: 32.7 g/dL (ref 31.5–35.7)
MCV: 90 fL (ref 79–97)
MONOCYTES: 5 %
Monocytes Absolute: 0.4 10*3/uL (ref 0.1–0.9)
NEUTROS ABS: 4.8 10*3/uL (ref 1.4–7.0)
Neutrophils: 66 %
Platelets: 232 10*3/uL (ref 150–450)
RBC: 4.32 x10E6/uL (ref 3.77–5.28)
RDW: 14 % (ref 12.3–15.4)
WBC: 7.4 10*3/uL (ref 3.4–10.8)

## 2018-01-19 LAB — COMPREHENSIVE METABOLIC PANEL
ALBUMIN: 3.8 g/dL (ref 3.5–5.5)
ALK PHOS: 118 IU/L — AB (ref 39–117)
ALT: 19 IU/L (ref 0–32)
AST: 20 IU/L (ref 0–40)
Albumin/Globulin Ratio: 1.4 (ref 1.2–2.2)
BILIRUBIN TOTAL: 0.3 mg/dL (ref 0.0–1.2)
BUN / CREAT RATIO: 14 (ref 9–23)
BUN: 10 mg/dL (ref 6–20)
CHLORIDE: 106 mmol/L (ref 96–106)
CO2: 22 mmol/L (ref 20–29)
Calcium: 8.8 mg/dL (ref 8.7–10.2)
Creatinine, Ser: 0.7 mg/dL (ref 0.57–1.00)
GFR calc non Af Amer: 117 mL/min/{1.73_m2} (ref >59)
GFR, EST AFRICAN AMERICAN: 135 mL/min/{1.73_m2} (ref >59)
GLOBULIN, TOTAL: 2.8 g/dL (ref 1.5–4.5)
Glucose: 104 mg/dL — ABNORMAL HIGH (ref 65–99)
Potassium: 4.2 mmol/L (ref 3.5–5.2)
SODIUM: 143 mmol/L (ref 134–144)
TOTAL PROTEIN: 6.6 g/dL (ref 6.0–8.5)

## 2018-01-19 LAB — LIPID PANEL W/O CHOL/HDL RATIO
CHOLESTEROL TOTAL: 151 mg/dL (ref 100–199)
HDL: 40 mg/dL (ref >39)
LDL CALC: 97 mg/dL (ref 0–99)
Triglycerides: 71 mg/dL (ref 0–149)
VLDL Cholesterol Cal: 14 mg/dL (ref 5–40)

## 2018-01-19 LAB — VITAMIN D 25 HYDROXY (VIT D DEFICIENCY, FRACTURES): VIT D 25 HYDROXY: 33 ng/mL (ref 30.0–100.0)

## 2018-01-19 LAB — TSH: TSH: 1.02 u[IU]/mL (ref 0.450–4.500)

## 2018-01-20 ENCOUNTER — Ambulatory Visit: Payer: Medicaid Other | Admitting: Family Medicine

## 2018-01-20 ENCOUNTER — Ambulatory Visit
Admission: RE | Admit: 2018-01-20 | Discharge: 2018-01-20 | Disposition: A | Discharge status: Home / Self Care | Payer: Medicaid Other | Source: Ambulatory Visit | Attending: Family Medicine | Admitting: Family Medicine

## 2018-01-20 DIAGNOSIS — R1032 Left lower quadrant pain: Secondary | ICD-10-CM | POA: Insufficient documentation

## 2018-01-21 ENCOUNTER — Telehealth: Payer: Self-pay | Admitting: Family Medicine

## 2018-01-21 MED ORDER — PREGABALIN 150 MG PO CAPS: 150.0000 mg | ORAL_CAPSULE | Freq: Two times a day (BID) | ORAL | 0 refills | Status: DC

## 2018-01-21 NOTE — Telephone Encounter (Signed)
Patient notified

## 2018-01-21 NOTE — Telephone Encounter (Signed)
Please let her know that her [place] was negative and her labs were normal. Thanks!

## 2018-01-21 NOTE — Telephone Encounter (Signed)
Copied from [place] [id number]. Topic: Quick Communication - Rx Refill/Question >> Jan 21, 2018  8:16 AM Boyd Kerbs wrote: Medication:  pregabalin (LYRICA) 150 MG capsule  Pt. Will be out on Sunday. Appt. On 6/11  Has the patient contacted their pharmacy? No. (Agent: If no, request that the patient contact the pharmacy for the refill.) (Agent: If yes, when and what did the pharmacy advise?)  Preferred Pharmacy (with phone number or street name):   [facility] [address] [place], [place] - [place] [address] Phone: [phone] Fax: [phone]    Agent: Please be advised that RX refills may take up to 3 business days. We ask that you follow-up with your pharmacy.

## 2018-01-24 NOTE — Telephone Encounter (Signed)
Refill sent to wrong pharmacy, please send to [facility] in [place]. Would like a call once sent [phone]

## 2018-01-25 ENCOUNTER — Ambulatory Visit: Payer: Medicaid Other | Admitting: Family Medicine

## 2018-01-25 MED ORDER — PREGABALIN 150 MG PO CAPS: 150.0000 mg | ORAL_CAPSULE | Freq: Two times a day (BID) | ORAL | 0 refills | Status: DC

## 2018-01-25 NOTE — Addendum Note (Signed)
Addended by: Valerie Roys on: 01/25/2018 08:10 AM   Modules accepted: Orders

## 2018-02-01 ENCOUNTER — Ambulatory Visit: Payer: Medicaid Other | Admitting: Family Medicine

## 2018-02-14 ENCOUNTER — Other Ambulatory Visit: Payer: Self-pay | Admitting: Family Medicine

## 2018-02-14 ENCOUNTER — Ambulatory Visit: Payer: Medicaid Other | Admitting: Family Medicine

## 2018-02-14 ENCOUNTER — Encounter: Payer: Self-pay | Admitting: Family Medicine

## 2018-02-14 VITALS — BP 107/75 | HR 86 | Temp 97.8°F | Wt 186.5 lb

## 2018-02-14 DIAGNOSIS — F321 Major depressive disorder, single episode, moderate: Secondary | ICD-10-CM | POA: Diagnosis not present

## 2018-02-14 DIAGNOSIS — F419 Anxiety disorder, unspecified: Secondary | ICD-10-CM

## 2018-02-14 DIAGNOSIS — N3000 Acute cystitis without hematuria: Secondary | ICD-10-CM | POA: Diagnosis not present

## 2018-02-14 DIAGNOSIS — M797 Fibromyalgia: Secondary | ICD-10-CM

## 2018-02-14 DIAGNOSIS — G43909 Migraine, unspecified, not intractable, without status migrainosus: Secondary | ICD-10-CM

## 2018-02-14 DIAGNOSIS — M549 Dorsalgia, unspecified: Secondary | ICD-10-CM

## 2018-02-14 DIAGNOSIS — I471 Supraventricular tachycardia: Secondary | ICD-10-CM

## 2018-02-14 DIAGNOSIS — E041 Nontoxic single thyroid nodule: Secondary | ICD-10-CM

## 2018-02-14 LAB — UA/M W/RFLX CULTURE, ROUTINE
BILIRUBIN UA: NEGATIVE
Glucose, UA: NEGATIVE
Ketones, UA: NEGATIVE
Nitrite, UA: NEGATIVE
PH UA: 7 (ref 5.0–7.5)
Protein, UA: NEGATIVE
RBC UA: NEGATIVE
Specific Gravity, UA: 1.015 (ref 1.005–1.030)
UUROB: 0.2 mg/dL (ref 0.2–1.0)

## 2018-02-14 LAB — MICROSCOPIC EXAMINATION

## 2018-02-14 MED ORDER — KETOROLAC TROMETHAMINE 60 MG/2ML IM SOLN
60.0000 mg | Freq: Once | INTRAMUSCULAR | Status: AC
  Administered 2018-02-14: 60 mg via INTRAMUSCULAR

## 2018-02-14 MED ORDER — METOPROLOL SUCCINATE ER 25 MG PO TB24: ORAL_TABLET | ORAL | 1 refills | Status: DC

## 2018-02-14 MED ORDER — RANITIDINE HCL 150 MG PO TABS: 150.0000 mg | ORAL_TABLET | Freq: Every day | ORAL | 3 refills | Status: DC | PRN

## 2018-02-14 MED ORDER — VENLAFAXINE HCL ER 37.5 MG PO CP24: ORAL_CAPSULE | ORAL | 1 refills | Status: DC

## 2018-02-14 MED ORDER — PREGABALIN 150 MG PO CAPS: 150.0000 mg | ORAL_CAPSULE | Freq: Two times a day (BID) | ORAL | 1 refills | Status: DC

## 2018-02-14 MED ORDER — CIPROFLOXACIN HCL 500 MG PO TABS: 500.0000 mg | ORAL_TABLET | Freq: Two times a day (BID) | ORAL | 0 refills | Status: DC

## 2018-02-14 NOTE — Assessment & Plan Note (Signed)
Stable on current regimen. Continue current regimen. Continue to monitor. Call with any concerns.  

## 2018-02-14 NOTE — Assessment & Plan Note (Signed)
Stable on current regimen. Continue current regimen. Continue to monitor. Call with any concerns. Refills given.

## 2018-02-14 NOTE — Assessment & Plan Note (Signed)
Stable on current regimen. Having migraine now. Toradol shot given. Continue to monitor. Call with any concerns.

## 2018-02-14 NOTE — Progress Notes (Signed)
BP 107/75 (BP Location: Right Arm, Patient Position: Sitting, Cuff Size: Large)   Pulse 86   Temp 97.8 F (36.6 C)   Wt 186 lb 8 oz (84.6 kg)   SpO2 98%   BMI 29.12 kg/m    Subjective:    Patient ID: Ellen Jackson, female    DOB: [birth year], 27 y.o.   MRN: [id number]  HPI: Ellen Jackson is a 27 y.o. female  Chief Complaint  Patient presents with  . Depression  . Fibromyalgia  . Gastroesophageal Reflux  . Headache  . Abdominal Pain   ABDOMINAL PAIN  Duration: 4 days Onset: gradual Severity: moderate Quality: cramping Location:  suprapubic". "lower abdominal quadrants  Episode duration: couple of hours Radiation: no Frequency: constant Alleviating factors: laying down Aggravating factors: sitting  Status: worse Treatments attempted: none Fever: no Nausea: yes Vomiting: no Weight loss: no Decreased appetite: yes Diarrhea: yes Constipation: no Blood in stool: no Heartburn: no Jaundice: no Rash: no Dysuria/urinary frequency: yes Hematuria: no History of sexually transmitted disease: no Recurrent NSAID use: no  DEPRESSION Mood status: controlled Satisfied with current treatment?: yes Symptom severity: mild  Duration of current treatment : chronic Side effects: no Medication compliance: excellent compliance Psychotherapy/counseling: no  Previous psychiatric medications: effexor Depressed mood: yes Anxious mood: yes Anhedonia: no Significant weight loss or gain: no Insomnia: no  Fatigue: yes Feelings of worthlessness or guilt: yes Impaired concentration/indecisiveness: no Suicidal ideations: no Hopelessness: no Crying spells: no Depression screen [facility] 2/9 02/14/2018 01/18/2018 07/02/2016 05/28/2016  Decreased Interest 1 1 1 1   Down, Depressed, Hopeless 1 1 1 2   PHQ - 2 Score 2 2 2 3   Altered sleeping 2 2 - 2  Tired, decreased energy 2 1 - 3  Change in appetite 1 0 - 2  Feeling bad or failure about yourself  1 1 - 2  Trouble concentrating 1 1 - 3    Moving slowly or fidgety/restless 2 1 - 2  Suicidal thoughts 0 0 - 0  PHQ-9 Score 11 8 - 17  Difficult doing work/chores Somewhat difficult Somewhat difficult - -   FIBROMYALGIA Pain status: controlled Satisfied with current treatment?: yes Medication side effects: no Medication compliance: excellent compliance Duration: chronic Location: aching Quality: aching  Current pain level: 7/10 Previous pain level: mild Aggravating factors: a lot of activity Alleviating factors: medicine Previous pain specialty evaluation: no Non-narcotic analgesic meds: yes Narcotic contract:no  Relevant past medical, surgical, family and social history reviewed and updated as indicated. Interim medical history since our last visit reviewed. Allergies and medications reviewed and updated.  Review of Systems  Constitutional: Positive for chills and fatigue. Negative for activity change, appetite change, diaphoresis, fever and unexpected weight change.  HENT: Negative.   Respiratory: Negative.   Cardiovascular: Negative.   Musculoskeletal: Positive for myalgias. Negative for arthralgias, back pain, gait problem, joint swelling, neck pain and neck stiffness.  Skin: Negative.   Neurological: Negative.   Psychiatric/Behavioral: Negative.     Per HPI unless specifically indicated above     Objective:    BP 107/75 (BP Location: Right Arm, Patient Position: Sitting, Cuff Size: Large)   Pulse 86   Temp 97.8 F (36.6 C)   Wt 186 lb 8 oz (84.6 kg)   SpO2 98%   BMI 29.12 kg/m   Wt Readings from Last 3 Encounters:  02/14/18 186 lb 8 oz (84.6 kg)  01/18/18 188 lb 4 oz (85.4 kg)  12/03/17 192 lb 3.2 oz (87.2  kg)    Physical Exam  Constitutional: She is oriented to person, place, and time. She appears well-developed and well-nourished. No distress.  HENT:  Head: Normocephalic and atraumatic.  Right Ear: Hearing normal.  Left Ear: Hearing normal.  Nose: Nose normal.  Mouth/Throat: Oropharynx is  clear and moist.  Eyes: Pupils are equal, round, and reactive to light. Conjunctivae, EOM and lids are normal. Right eye exhibits no discharge. Left eye exhibits no discharge. No scleral icterus. Right eye exhibits normal extraocular motion and no nystagmus. Left eye exhibits normal extraocular motion and no nystagmus. Right pupil is round and reactive. Left pupil is round and reactive. Pupils are equal.  Neck: Normal range of motion. Neck supple. No JVD present. No neck rigidity. No tracheal deviation present. No Brudzinski's sign and no Kernig's sign noted.  Cardiovascular: Normal rate, regular rhythm, normal heart sounds and intact distal pulses. Exam reveals no gallop and no friction rub.  No murmur heard. Pulmonary/Chest: Effort normal and breath sounds normal. No stridor. No respiratory distress. She has no wheezes. She has no rales. She exhibits no tenderness.  Musculoskeletal: Normal range of motion. She exhibits no edema or tenderness.  Lymphadenopathy:    She has no cervical adenopathy.  Neurological: She is alert and oriented to person, place, and time.  Skin: Skin is warm, dry and intact. Capillary refill takes less than 2 seconds. No rash noted. She is not diaphoretic. No cyanosis or erythema. No pallor.  Psychiatric: She has a normal mood and affect. Her speech is normal and behavior is normal. Judgment and thought content normal. Her mood appears not anxious. She is not agitated. Cognition and memory are normal.  Nursing note and vitals reviewed.   Results for orders placed or performed in visit on 01/18/18  CBC with Differential/Platelet  Result Value Ref Range   WBC 7.4 3.4 - 10.8 x10E3/uL   RBC 4.32 3.77 - 5.28 x10E6/uL   Hemoglobin 12.7 11.1 - 15.9 g/dL   Hematocrit 38.8 34.0 - 46.6 %   MCV 90 79 - 97 fL   MCH 29.4 26.6 - 33.0 pg   MCHC 32.7 31.5 - 35.7 g/dL   RDW 14.0 12.3 - 15.4 %   Platelets 232 150 - 450 x10E3/uL   Neutrophils 66 Not Estab. %   Lymphs 23 Not Estab.  %   Monocytes 5 Not Estab. %   Eos 6 Not Estab. %   Basos 0 Not Estab. %   Neutrophils Absolute 4.8 1.4 - 7.0 x10E3/uL   Lymphocytes Absolute 1.7 0.7 - 3.1 x10E3/uL   Monocytes Absolute 0.4 0.1 - 0.9 x10E3/uL   EOS (ABSOLUTE) 0.5 (H) 0.0 - 0.4 x10E3/uL   Basophils Absolute 0.0 0.0 - 0.2 x10E3/uL   Immature Granulocytes 0 Not Estab. %   Immature Grans (Abs) 0.0 0.0 - 0.1 x10E3/uL  Comprehensive metabolic panel  Result Value Ref Range   Glucose 104 (H) 65 - 99 mg/dL   BUN 10 6 - 20 mg/dL   Creatinine, Ser 0.70 0.57 - 1.00 mg/dL   GFR calc non Af Amer 117 >59 mL/min/1.73   GFR calc Af Amer 135 >59 mL/min/1.73   BUN/Creatinine Ratio 14 9 - 23   Sodium 143 134 - 144 mmol/L   Potassium 4.2 3.5 - 5.2 mmol/L   Chloride 106 96 - 106 mmol/L   CO2 22 20 - 29 mmol/L   Calcium 8.8 8.7 - 10.2 mg/dL   Total Protein 6.6 6.0 - 8.5 g/dL   Albumin  3.8 3.5 - 5.5 g/dL   Globulin, Total 2.8 1.5 - 4.5 g/dL   Albumin/Globulin Ratio 1.4 1.2 - 2.2   Bilirubin Total 0.3 0.0 - 1.2 mg/dL   Alkaline Phosphatase 118 (H) 39 - 117 IU/L   AST 20 0 - 40 IU/L   ALT 19 0 - 32 IU/L  Lipid Panel w/o Chol/HDL Ratio  Result Value Ref Range   Cholesterol, Total 151 100 - 199 mg/dL   Triglycerides 71 0 - 149 mg/dL   HDL 40 >39 mg/dL   VLDL Cholesterol Cal 14 5 - 40 mg/dL   LDL Calculated 97 0 - 99 mg/dL  TSH  Result Value Ref Range   TSH 1.020 0.450 - 4.500 uIU/mL  UA/M w/rflx Culture, Routine  Result Value Ref Range   Specific Gravity, UA 1.015 1.005 - 1.030   pH, UA 8.5 (H) 5.0 - 7.5   Color, UA Yellow Yellow   Appearance Ur Cloudy (A) Clear   Leukocytes, UA Negative Negative   Protein, UA Trace (A) Negative/Trace   Glucose, UA Negative Negative   Ketones, UA Negative Negative   RBC, UA Negative Negative   Bilirubin, UA Negative Negative   Urobilinogen, Ur 1.0 0.2 - 1.0 mg/dL   Nitrite, UA Negative Negative  VITAMIN D 25 Hydroxy (Vit-D Deficiency, Fractures)  Result Value Ref Range   Vit D,  25-Hydroxy 33.0 30.0 - 100.0 ng/mL      Assessment & Plan:   Problem List Items Addressed This Visit      Cardiovascular and Mediastinum   Headache, migraine    Stable on current regimen. Having migraine now. Toradol shot given. Continue to monitor. Call with any concerns.       Relevant Medications   ketorolac (TORADOL) injection 60 mg (Completed)   venlafaxine XR (EFFEXOR-XR) 37.5 MG 24 hr capsule   pregabalin (LYRICA) 150 MG capsule   metoprolol succinate (TOPROL-XL) 25 MG 24 hr tablet   Paroxysmal supraventricular tachycardia (HCC)    Stable on current regimen. Continue current regimen. Continue to monitor. Call with any concerns.       Relevant Medications   metoprolol succinate (TOPROL-XL) 25 MG 24 hr tablet     Endocrine   Thyroid nodule    No symptoms at this time. Will get [place] when due. Call with any concerns.       Relevant Medications   metoprolol succinate (TOPROL-XL) 25 MG 24 hr tablet     Other   Fibromyalgia    Stable on current regimen. Continue current regimen. Continue to monitor. Call with any concerns. Refills given.       Anxiety disorder    Stable on current regimen. Continue current regimen. Continue to monitor. Call with any concerns. Refills given.       Relevant Medications   venlafaxine XR (EFFEXOR-XR) 37.5 MG 24 hr capsule   Major depressive disorder, single episode    Stable on current regimen. Continue current regimen. Continue to monitor. Call with any concerns. Refills given.       Relevant Medications   venlafaxine XR (EFFEXOR-XR) 37.5 MG 24 hr capsule    Other Visit Diagnoses    Acute cystitis without hematuria    -  Primary   Will treat with cipro. Call with any concerns. Continue to monitor.    Relevant Orders   Urine Culture   Back pain, unspecified back location, unspecified back pain laterality, unspecified chronicity       +UTI- will treat.   Relevant  Medications   ketorolac (TORADOL) injection 60 mg (Completed)   Other  Relevant Orders   UA/M w/rflx Culture, Routine       Follow up plan: Return in about 6 months (around 08/16/2018).

## 2018-02-14 NOTE — Assessment & Plan Note (Signed)
No symptoms at this time. Will get [place] when due. Call with any concerns.

## 2018-02-16 LAB — URINE CULTURE

## 2018-02-23 ENCOUNTER — Telehealth: Payer: Self-pay | Admitting: Family Medicine

## 2018-02-23 ENCOUNTER — Other Ambulatory Visit: Payer: Self-pay | Admitting: Family Medicine

## 2018-02-23 NOTE — Telephone Encounter (Signed)
Pt made aware sent to different RX.Marland Kitchen Pt will call pharmacy.

## 2018-02-23 NOTE — Telephone Encounter (Signed)
Lyrica 150 mg refill request  LOV 01/18/18 with Dr. Wynetta Emery  Last refill:  02/14/18  #180  1 refill  [place], [place]

## 2018-02-23 NOTE — Telephone Encounter (Unsigned)
Copied from [place] [phone]. Topic: Quick Communication - See Telephone Encounter >> Feb 23, 2018 10:13 AM Marja Kays F wrote: Pt is calling checking on status of her lyrica generic prescription she states that she only has two more days left and she is going out of town on the 4th  Best number is [phone]

## 2018-04-05 ENCOUNTER — Encounter: Payer: Self-pay | Admitting: Family Medicine

## 2018-04-05 ENCOUNTER — Ambulatory Visit: Payer: Medicaid Other | Admitting: Family Medicine

## 2018-04-05 VITALS — BP 112/73 | HR 74 | Temp 98.4°F | Wt 185.2 lb

## 2018-04-05 DIAGNOSIS — Z8672 Personal history of thrombophlebitis: Secondary | ICD-10-CM | POA: Diagnosis not present

## 2018-04-05 DIAGNOSIS — N946 Dysmenorrhea, unspecified: Secondary | ICD-10-CM | POA: Diagnosis not present

## 2018-04-05 DIAGNOSIS — Z30013 Encounter for initial prescription of injectable contraceptive: Secondary | ICD-10-CM

## 2018-04-05 DIAGNOSIS — Z86711 Personal history of pulmonary embolism: Secondary | ICD-10-CM | POA: Diagnosis not present

## 2018-04-05 MED ORDER — MEDROXYPROGESTERONE ACETATE 150 MG/ML IM SUSP
150.0000 mg | Freq: Once | INTRAMUSCULAR | Status: AC
  Administered 2018-04-05: 150 mg via INTRAMUSCULAR

## 2018-04-05 NOTE — Assessment & Plan Note (Signed)
DVT and PE were provoked. Saw hematology and they recommended against hormonal contraception. However, patient states that she is in severe pain. We will do 1 shot of depo and she will remain active for the next 3 months. Will work on cutting down on smoking. Will get her into see GYN for more definitive non-hormonal treatments of her dysmenorrhea given her history. Call with any concerns. Referral generated today.

## 2018-04-05 NOTE — Progress Notes (Signed)
BP 112/73 (BP Location: Left Arm, Patient Position: Sitting, Cuff Size: Normal)   Pulse 74   Temp 98.4 F (36.9 C)   Wt 185 lb 4 oz (84 kg)   SpO2 98%   BMI 28.93 kg/m    Subjective:    Patient ID: Ellen Jackson, female    DOB: 1990/12/01, 27 y.o.   MRN: [id number]  HPI: Ellen Jackson is a 26 y.o. female  Chief Complaint  Patient presents with  . Contraception   CONTRACEPTION CONCERNS Contraception: Technical sales engineer vasectomy Previous contraception: Depo Sexual activity: Monogamous Menarche at age: 10 Average interval between menses: 27-28 days  Length of menses: 3-4 days Flow: Very heavy Dysmenorrhea: Bad cramps  History of PE and DVT- notes from hematology reviewed.   Relevant past medical, surgical, family and social history reviewed and updated as indicated. Interim medical history since our last visit reviewed. Allergies and medications reviewed and updated.  Review of Systems  Constitutional: Negative.   Respiratory: Negative.   Cardiovascular: Negative.   Musculoskeletal: Negative.   Psychiatric/Behavioral: Negative.     Per HPI unless specifically indicated above     Objective:    BP 112/73 (BP Location: Left Arm, Patient Position: Sitting, Cuff Size: Normal)   Pulse 74   Temp 98.4 F (36.9 C)   Wt 185 lb 4 oz (84 kg)   SpO2 98%   BMI 28.93 kg/m   Wt Readings from Last 3 Encounters:  04/05/18 185 lb 4 oz (84 kg)  02/14/18 186 lb 8 oz (84.6 kg)  01/18/18 188 lb 4 oz (85.4 kg)    Physical Exam  Constitutional: She is oriented to person, place, and time. She appears well-developed and well-nourished. No distress.  HENT:  Head: Normocephalic and atraumatic.  Right Ear: Hearing normal.  Left Ear: Hearing normal.  Nose: Nose normal.  Eyes: Conjunctivae and lids are normal. Right eye exhibits no discharge. Left eye exhibits no discharge. No scleral icterus.  Cardiovascular: Normal rate, regular rhythm, normal heart sounds and intact distal  pulses. Exam reveals no gallop and no friction rub.  No murmur heard. Pulmonary/Chest: Effort normal and breath sounds normal. No respiratory distress.  Musculoskeletal: Normal range of motion.  Neurological: She is alert and oriented to person, place, and time.  Skin: Skin is intact. No rash noted. She is not diaphoretic.  Psychiatric: She has a normal mood and affect. Her speech is normal and behavior is normal. Judgment and thought content normal. Cognition and memory are normal.    Results for orders placed or performed in visit on 02/14/18  Urine Culture  Result Value Ref Range   Urine Culture, Routine Final report    Organism ID, Bacteria Comment       Assessment & Plan:   Problem List Items Addressed This Visit      Genitourinary   Dysmenorrhea    DVT and PE were provoked. Saw hematology and they recommended against hormonal contraception. However, patient states that she is in severe pain. We will do 1 shot of depo and she will remain active for the next 3 months. Will work on cutting down on smoking. Will get her into see GYN for more definitive non-hormonal treatments of her dysmenorrhea given her history. Call with any concerns. Referral generated today.      Relevant Orders   Ambulatory referral to Obstetrics / Gynecology     Other   Hx of deep vein thrombophlebitis of lower extremity    DVT  and PE were provoked. Saw hematology and they recommended against hormonal contraception. However, patient states that she is in severe pain. We will do 1 shot of depo and she will remain active for the next 3 months. Will work on cutting down on smoking. Will get her into see GYN for more definitive non-hormonal treatments of her dysmenorrhea given her history. Call with any concerns. Referral generated today.      Relevant Orders   Ambulatory referral to Obstetrics / Gynecology   Hx of pulmonary embolus    DVT and PE were provoked. Saw hematology and they recommended against  hormonal contraception. However, patient states that she is in severe pain. We will do 1 shot of depo and she will remain active for the next 3 months. Will work on cutting down on smoking. Will get her into see GYN for more definitive non-hormonal treatments of her dysmenorrhea given her history. Call with any concerns. Referral generated today.      Relevant Orders   Ambulatory referral to Obstetrics / Gynecology    Other Visit Diagnoses    Encounter for initial prescription of injectable contraceptive    -  Primary   1 shot given today. Will need to see GYN for more definitive treatment before due for next depo is due.    Relevant Medications   medroxyPROGESTERone (DEPO-PROVERA) injection 150 mg (Completed)   Other Relevant Orders   Pregnancy, urine       Follow up plan: Return if symptoms worsen or fail to improve.

## 2018-04-06 LAB — PREGNANCY, URINE: PREG TEST UR: NEGATIVE

## 2018-04-19 ENCOUNTER — Encounter: Payer: Self-pay | Admitting: Obstetrics and Gynecology

## 2018-04-19 ENCOUNTER — Ambulatory Visit: Payer: Medicaid Other | Admitting: Obstetrics and Gynecology

## 2018-04-19 VITALS — BP 108/64 | HR 97 | Ht 67.0 in | Wt 184.0 lb

## 2018-04-19 DIAGNOSIS — E7212 Methylenetetrahydrofolate reductase deficiency: Secondary | ICD-10-CM

## 2018-04-19 DIAGNOSIS — N921 Excessive and frequent menstruation with irregular cycle: Secondary | ICD-10-CM

## 2018-04-19 DIAGNOSIS — Z308 Encounter for other contraceptive management: Secondary | ICD-10-CM

## 2018-04-19 DIAGNOSIS — Z86711 Personal history of pulmonary embolism: Secondary | ICD-10-CM

## 2018-04-19 DIAGNOSIS — Z8672 Personal history of thrombophlebitis: Secondary | ICD-10-CM

## 2018-04-19 DIAGNOSIS — N946 Dysmenorrhea, unspecified: Secondary | ICD-10-CM | POA: Diagnosis not present

## 2018-04-19 DIAGNOSIS — N920 Excessive and frequent menstruation with regular cycle: Secondary | ICD-10-CM | POA: Insufficient documentation

## 2018-04-19 DIAGNOSIS — Z1589 Genetic susceptibility to other disease: Secondary | ICD-10-CM

## 2018-04-19 NOTE — Progress Notes (Signed)
Obstetrics & Gynecology Office Visit   Chief Complaint  Patient presents with  . Dysmenorrhea    Pt use to be on depo but when she was told she had a blood clot was told she could no longer get it, pt want to discuss other alertnatives for painful menstruals   Referral from [facility], Dr. Park Liter for contraceptive counseling in the setting of a history of DVT/PE.  History of Present Illness: 27 y.o. [phone] female who is seen in referral from Dr. Park Liter at [facility], for contraceptive management.  She has a history of DVT.  She sees Dr. Janese Banks for hematology. She has had testing for thrombophilia.  The only thing that has come back as positive is a MTHFR mutation, heterozygous.  All other thrombophilia workup has been negative.   The issues she has had in the past have been ovarian cyst formation. She has had to have surgery for cyst removal.  The Depo Provera injection has seemed to work well to prevent cyst formation.  When she has a period she has a very heavy and very painful periods.  She also gets sharp and stabbing pains on her side. Her last Depo Provera shot was a week ago.  She has two children, 51 and 31 years old.  She had miscarriages at 8 and 10 weeks.  Her last DVT was in November.  She does smoke and is considering quitting.  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  She has tried a Corporate treasurer IUD and did not have a good experience with the device.  Though, she states the device became dislodged and was expelled.  Past Medical History:  Diagnosis Date  . Allergy   . Ankle fracture 06/20/2017  . Anxiety   . Closed right ankle fracture 06/19/2017  . DVT (deep venous thrombosis) ([place]) 07/13/2017   RLE  . DVT (deep venous thrombosis) ([place]) 09/13/2017  . DVT of popliteal vein ([place]) 07/13/2017  . Dysrhythmia   . Exertional dyspnea 10/14/2016  . Fibromyalgia    everywhere  . GERD (gastroesophageal reflux disease)   . Gonalgia 10/16/2014  .  Heart palpitations 09/13/2017  . Hernia, umbilical   . Heterozygous MTHFR mutation C677T ([place])   . IBS (irritable bowel syndrome)   . LLQ abdominal pain 02/05/2015  . Migraine with aura    bc powders is only thing that works.  . Ovarian cyst   . Pain in joint involving lower leg 10/16/2014  . Pulmonary embolism ([place]) 07/23/2017  . Recurrent umbilical hernia 3/55/9741  . Spina bifida occulta   . SVT (supraventricular tachycardia) (H[facility])    Past Surgical History:  Procedure Laterality Date  . APPENDECTOMY  2015  . CESAREAN SECTION  2011  . CHOLECYSTECTOMY  2005  . HERNIA REPAIR  6384   Umbilical Hernia Repair with mesh  . KNEE ARTHROSCOPY Left 2015   X 3. plate and pin in left shin placed after several surgeries. last surgery for this was 20154  . ORIF ANKLE FRACTURE Right 06/20/2017   Procedure: OPEN REDUCTION INTERNAL FIXATION (ORIF) TRIMALLEOLAR ANKLE FRACTURE LATERAL LIGAMENT RECONSTRUCTION;  Surgeon: Thornton Park, MD;  Location: [facility] ORS;  Service: Orthopedics;  Laterality: Right;  . OVARIAN CYST SURGERY    . UMBILICAL HERNIA REPAIR N/A 10/09/2016   Procedure: HERNIA REPAIR UMBILICAL ADULT;  Surgeon: Olean Ree, MD;  Location: [facility] ORS;  Service: General;  Laterality: N/A;    Gynecologic History: Patient's last menstrual period was 04/02/2018 (approximate).  Obstetric History: [id number], s/p SVD x 1, and CS x 1  Family History  Problem Relation Age of Onset  . Arthritis Mother   . Hyperlipidemia Mother   . Hypertension Mother   . Migraines Mother   . Thrombophilia Mother   . Arthritis Father   . Autism Son   . Cancer Maternal Grandfather        pancreatic  . Stroke Maternal Grandfather   . Diabetes Paternal Grandmother   . Graves' disease Maternal Aunt    Social History   Socioeconomic History  . Marital status: Single    Spouse name: Not on file  . Number of children: Not on file  . Years of education: Not on file  . Highest education level: Not on file    Occupational History  . Not on file  Social Needs  . Financial resource strain: Not on file  . Food insecurity:    Worry: Not on file    Inability: Not on file  . Transportation needs:    Medical: Not on file    Non-medical: Not on file  Tobacco Use  . Smoking status: Current Every Day Smoker    Packs/day: 0.50    Years: 3.00    Pack years: 1.50    Types: Cigarettes  . Smokeless tobacco: Never Used  Substance and Sexual Activity  . Alcohol use: No    Alcohol/week: 0.0 standard drinks  . Drug use: No  . Sexual activity: Never  Lifestyle  . Physical activity:    Days per week: Not on file    Minutes per session: Not on file  . Stress: Not on file  Relationships  . Social connections:    Talks on phone: Not on file    Gets together: Not on file    Attends religious service: Not on file    Active member of club or organization: Not on file    Attends meetings of clubs or organizations: Not on file    Relationship status: Not on file  . Intimate partner violence:    Fear of current or ex partner: Not on file    Emotionally abused: Not on file    Physically abused: Not on file    Forced sexual activity: Not on file  Other Topics Concern  . Not on file  Social History Narrative  . Not on file   Allergies  Allergen Reactions  . Codeine     Other reaction(s): Other (See Comments) Passes out  . Latex Itching and Rash    Lips itching  . Morphine Itching and Shortness Of Breath  . Nalbuphine Itching and Shortness Of Breath  . Ondansetron Hcl Hives and Rash  . Other Hives    Allergic to steri strips. tegaderm ok.. Paper tape is ok. Clear plastic tape rips off skin  . Penicillins Rash    Patient reports severe rash and itching. Has patient had a PCN reaction causing immediate rash, facial/tongue/throat swelling, SOB or lightheadedness with hypotension: Yes Has patient had a PCN reaction causing severe rash involving mucus membranes or skin necrosis: No Has patient had  a PCN reaction that required hospitalization unknown Has patient had a PCN reaction occurring within the last 10 years:  unknown If all of the above answers are "NO", then may proceed  . Codeine Sulfate Other (See Comments)    fainting  . Metoclopramide Anxiety    Other reaction(s): Other (See Comments) "jittery" Difficulty breathing  . Prochlorperazine Anxiety and Other (See  Comments)    Difficulty breathing  . Prochlorperazine Edisylate Anxiety  . Promethazine Anxiety and Other (See Comments)    "jittery" Difficulty breathing     . Tape     See above ... Paper tape ok  . Tizanidine Other (See Comments)    Worsens headache    Prior to Admission medications   Medication Sig Start Date End Date Taking? Authorizing Provider  LORazepam (ATIVAN) 0.5 MG tablet Take 1 tablet (0.5 mg total) by mouth 2 (two) times daily as needed for anxiety. 07/21/17  Yes Volney American, PA-C  metoprolol succinate (TOPROL-XL) 25 MG 24 hr tablet TAKE 1 AND 1/2 TABLETS(37.5 MG) BY MOUTH DAILY 02/14/18  Yes Johnson, Megan P, DO  pregabalin (LYRICA) 150 MG capsule Take 1 capsule (150 mg total) by mouth 2 (two) times daily. 02/14/18  Yes Johnson, Megan P, DO  ranitidine (ZANTAC) 150 MG tablet Take 1 tablet (150 mg total) by mouth daily as needed for heartburn. 02/14/18  Yes Johnson, Megan P, DO  venlafaxine XR (EFFEXOR-XR) 37.5 MG 24 hr capsule TAKE 1 CAPSULE(37.5 MG) BY MOUTH TWICE DAILY 02/14/18  Yes Johnson, Megan P, DO  polyethylene glycol (MIRALAX / GLYCOLAX) packet Take 17 g by mouth 2 (two) times daily as needed for mild constipation.     [provider]    Review of Systems  Constitutional: Negative.   HENT: Negative.   Eyes: Negative.   Respiratory: Negative.   Cardiovascular: Negative.   Gastrointestinal: Negative.   Genitourinary: Negative.   Musculoskeletal: Positive for joint pain. Negative for back pain, falls, myalgias and neck pain.  Skin: Negative.   Neurological:  Positive for headaches. Negative for dizziness, tingling, tremors, sensory change, speech change, focal weakness, seizures, loss of consciousness and weakness.  Psychiatric/Behavioral: Negative for depression, hallucinations, memory loss, substance abuse and suicidal ideas. The patient is nervous/anxious. The patient does not have insomnia.      Physical Exam BP 108/64   Pulse 97   Ht 5\' 7"  (1.702 m)   Wt 184 lb (83.5 kg)   LMP 04/02/2018 (Approximate)   SpO2 99%   BMI 28.82 kg/m  Patient's last menstrual period was 04/02/2018 (approximate). Physical Exam  Constitutional: She is oriented to person, place, and time. She appears well-developed and well-nourished. No distress.  HENT:  Head: Normocephalic and atraumatic.  Eyes: Conjunctivae are normal. No scleral icterus.  Neck: Normal range of motion. Neck supple. No thyromegaly present.  Musculoskeletal: Normal range of motion. She exhibits no edema.  Neurological: She is alert and oriented to person, place, and time. No cranial nerve deficit.  Skin: Skin is warm and dry. No erythema.  Psychiatric: She has a normal mood and affect. Her behavior is normal. Judgment normal.   Assessment: 27 y.o. [id number] female here for  1. Encounter for other contraceptive management   2. Hx of deep vein thrombophlebitis of lower extremity   3. Heterozygous MTHFR mutation C677T ([place])   4. Hx of pulmonary embolus   5. Dysmenorrhea   6. Menorrhagia with irregular cycle      Plan: Problem List Items Addressed This Visit      Genitourinary   Dysmenorrhea     Other   Heterozygous MTHFR mutation C677T ([place])   Hx of deep vein thrombophlebitis of lower extremity   Hx of pulmonary embolus   Menorrhagia    Other Visit Diagnoses    Encounter for other contraceptive management    -  Primary     Discussed  various forms of contraception given her concerns.  She is concerned about getting another DVT, having more ovarian cysts, and the pain and volume  of her menses.  Using the [facility] Medical Eligibility Criteria chart, she would be a reasonable candidate for a Mirena IUD, which should theoretically have a lower overall systemic uptake of the progesterone hormone. This would certainly give her contraception.  This should also help decrease the pain and volume of flow with her menstruation. The Mirena IUD has about a 12% risk of cyst formation, many of which are not clinically relevant.  The Mirena is a category 2 method of contraception using the [facility] Medical Eligibility Criteria.  I have reviewed records from Dr. Janese Banks, her hematologist from April of this year.  She states that she would utilize no hormones for contraception, ideally.  However, the few non-hormonal options might not be ideal for her. We discussed the copper IUD. The issue is that it would provide no protection from cyst formation and her periods would still be similarly heavy and painful.  The big benefit of the paragard being that it is non-hormonal and does provide excellent contraception.  She is interested in the [place] IUD. I have sent a message to Dr. Janese Banks to see if she would be OK with this option for this patient.  If so, we will schedule the placement of a Mirena. Otherwise, we will have to discuss other options.  30 minutes spent in face to face discussion with > 50% spent in counseling,management, and coordination of care of her contraceptive management in the setting of her history of DVT/PE, MTHFR heterozygous status, menorrhagia, dysmenorrhea, and history of ovarian cyst formation.   Prentice Docker, MD 04/19/2018 12:24 PM     CC: Valerie Roys, DO [address], [place] [address]

## 2018-04-30 ENCOUNTER — Telehealth: Payer: Self-pay | Admitting: Obstetrics and Gynecology

## 2018-04-30 NOTE — Telephone Encounter (Signed)
Left generic VM 

## 2018-04-30 NOTE — Telephone Encounter (Signed)
-----   Message from Sindy Guadeloupe, MD sent at 04/19/2018 12:47 PM EDT ----- Regarding: RE: Contraception for patient Hello Dr. Glennon Mac,  Verdis Frederickson would not have significant systemic absorption and would be reasonable contraception option for her although copper IUD with no hormone exposure would be ideal. But I understand it is not well tolerated so Mirena would still be acceptable  Her dvt and pe in nov 2018 was likely provoked by trauma although she was on depot shots and it would be hard to exclude later as the provoking factor.  Hope this helps. Regards, Archana ----- Message ----- From: Will Bonnet, MD Sent: 04/19/2018  12:14 PM EDT To: Sindy Guadeloupe, MD Subject: Contraception for patient                      Dr. Janese Banks, I saw this patient today to consult for contraception.  I reviewed her history as outlined by your note and by her report.  Based on information I gather from the [facility] and their Medical Eligibility Criteria, the levonorgestrel IUD (Mirena) would be a category 2 (advantages generally outweigh theoretical or proven risks).  Based on her medical history and high risk should she get pregnant, I would recommend the IUD to her.   I wanted to see whether you, as her hematologist, would be OK with this prior to placing the device.  Your input would be greatly appreciated.  Sincerely, Prentice Docker, MD

## 2018-05-09 NOTE — Telephone Encounter (Signed)
Pt has still not called back, do you want to try again?

## 2018-05-16 ENCOUNTER — Other Ambulatory Visit: Payer: Self-pay

## 2018-05-16 ENCOUNTER — Encounter: Payer: Self-pay | Admitting: Emergency Medicine

## 2018-05-16 ENCOUNTER — Emergency Department
Admission: EM | Admit: 2018-05-16 | Discharge: 2018-05-16 | Disposition: A | Discharge status: Home / Self Care | Payer: Medicaid Other | Attending: Emergency Medicine | Admitting: Emergency Medicine

## 2018-05-16 DIAGNOSIS — Z91040 Latex allergy status: Secondary | ICD-10-CM | POA: Insufficient documentation

## 2018-05-16 DIAGNOSIS — M654 Radial styloid tenosynovitis [de Quervain]: Secondary | ICD-10-CM | POA: Diagnosis not present

## 2018-05-16 DIAGNOSIS — M25532 Pain in left wrist: Secondary | ICD-10-CM | POA: Diagnosis present

## 2018-05-16 DIAGNOSIS — Z79899 Other long term (current) drug therapy: Secondary | ICD-10-CM | POA: Diagnosis not present

## 2018-05-16 MED ORDER — TRAMADOL HCL 50 MG PO TABS: 50.0000 mg | ORAL_TABLET | Freq: Two times a day (BID) | ORAL | 0 refills | Status: DC | PRN

## 2018-05-16 MED ORDER — TRAMADOL HCL 50 MG PO TABS
50.0000 mg | ORAL_TABLET | Freq: Once | ORAL | Status: DC
  Filled 2018-05-16: qty 1

## 2018-05-16 MED ORDER — NAPROXEN 500 MG PO TABS: 500.0000 mg | ORAL_TABLET | Freq: Two times a day (BID) | ORAL | Status: DC

## 2018-05-16 MED ORDER — NAPROXEN 500 MG PO TABS
500.0000 mg | ORAL_TABLET | Freq: Once | ORAL | Status: AC
  Administered 2018-05-16: 500 mg via ORAL
  Filled 2018-05-16: qty 1

## 2018-05-16 NOTE — ED Triage Notes (Signed)
C/O left wrist and thumb pain x 3 days.  States pain has worsened over the past three days.  No known injury.

## 2018-05-16 NOTE — ED Provider Notes (Signed)
[facility] Emergency Department Provider Note   ____________________________________________   First MD Initiated Contact with Patient 05/16/18 1411     (approximate)  I have reviewed the triage vital signs and the nursing notes.   HISTORY  Chief Complaint Wrist Pain    HPI Ellen MCEUEN is a 27 y.o. female patient complain of left wrist and thumb pain for 3 days.  Patient state incident occurred after lifting heavy object couple days ago.  Patient denies loss of sensation.  Patient state pain increased with flexion extension of the thumb.  Patient rates the pain as a 6/10.  Patient described pain is "aching".  No palates this measures for complaint.  Patient is right-hand dominant.  Past Medical History:  Diagnosis Date  . Allergy   . Ankle fracture 06/20/2017  . Anxiety   . Closed right ankle fracture 06/19/2017  . DVT (deep venous thrombosis) ([place]) 07/13/2017   RLE  . DVT (deep venous thrombosis) ([place]) 09/13/2017  . DVT of popliteal vein ([place]) 07/13/2017  . Dysrhythmia   . Exertional dyspnea 10/14/2016  . Fibromyalgia    everywhere  . GERD (gastroesophageal reflux disease)   . Gonalgia 10/16/2014  . Heart palpitations 09/13/2017  . Hernia, umbilical   . Heterozygous MTHFR mutation C677T ([place])   . IBS (irritable bowel syndrome)   . LLQ abdominal pain 02/05/2015  . Migraine with aura    bc powders is only thing that works.  . Ovarian cyst   . Pain in joint involving lower leg 10/16/2014  . Pulmonary embolism ([place]) 07/23/2017  . Recurrent umbilical hernia 02/21/1600  . Spina bifida occulta   . SVT (supraventricular tachycardia) [facility])     Patient Active Problem List   Diagnosis Date Noted  . Menorrhagia 04/19/2018  . Hx of deep vein thrombophlebitis of lower extremity 04/05/2018  . Hx of pulmonary embolus 04/05/2018  . Dysmenorrhea 04/05/2018  . Thyroid nodule 10/06/2017  . S/P umbilical hernia repair, follow-up exam 10/19/2016  .  Fibromyalgia 07/30/2016  . Headache, migraine 02/05/2015  . Heterozygous MTHFR mutation C677T ([place]) 02/05/2015  . 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 Paroxysmal supraventricular tachycardia ([place]) 02/05/2015  . Anxiety disorder 02/05/2015  . Major depressive disorder, single episode 02/05/2015    Past Surgical History:  Procedure Laterality Date  . APPENDECTOMY  2015  . CESAREAN SECTION  2011  . CHOLECYSTECTOMY  2005  . HERNIA REPAIR  0932   Umbilical Hernia Repair with mesh  . KNEE ARTHROSCOPY Left 2015   X 3. plate and pin in left shin placed after several surgeries. last surgery for this was 20154  . ORIF ANKLE FRACTURE Right 06/20/2017   Procedure: OPEN REDUCTION INTERNAL FIXATION (ORIF) TRIMALLEOLAR ANKLE FRACTURE LATERAL LIGAMENT RECONSTRUCTION;  Surgeon: Thornton Park, MD;  Location: [facility] ORS;  Service: Orthopedics;  Laterality: Right;  . OVARIAN CYST SURGERY    . UMBILICAL HERNIA REPAIR N/A 10/09/2016   Procedure: HERNIA REPAIR UMBILICAL ADULT;  Surgeon: Olean Ree, MD;  Location: [facility] ORS;  Service: General;  Laterality: N/A;    Prior to Admission medications   Medication Sig Start Date End Date Taking? Authorizing Provider  albuterol (PROVENTIL HFA;VENTOLIN HFA) 108 (90 Base) MCG/ACT inhaler Inhale 2 puffs into the lungs every 6 (six) hours as needed for wheezing or shortness of breath. Patient not taking: Reported on 12/03/2017 11/17/16   Park Liter P, DO  cyclobenzaprine (FLEXERIL) 10 MG tablet Take 1 tablet (10 mg total) by mouth 3 (three) times daily  as needed for muscle spasms. Patient not taking: Reported on 12/03/2017 10/16/16   Jules Husbands, MD  LORazepam (ATIVAN) 0.5 MG tablet Take 1 tablet (0.5 mg total) by mouth 2 (two) times daily as needed for anxiety. 07/21/17   Volney American, PA-C  metoprolol succinate (TOPROL-XL) 25 MG 24 hr tablet TAKE 1 AND 1/2 TABLETS(37.5 MG) BY MOUTH DAILY 02/14/18   Wynetta Emery, Megan P, DO  naproxen (NAPROSYN) 500 MG tablet Take 1  tablet (500 mg total) by mouth 2 (two) times daily with a meal. 05/16/18   Sable Feil, PA-C  polyethylene glycol (MIRALAX / GLYCOLAX) packet Take 17 g by mouth 2 (two) times daily as needed for mild constipation.     [provider]  pregabalin (LYRICA) 150 MG capsule Take 1 capsule (150 mg total) by mouth 2 (two) times daily. 02/14/18   Johnson, Megan P, DO  ranitidine (ZANTAC) 150 MG tablet Take 1 tablet (150 mg total) by mouth daily as needed for heartburn. 02/14/18   Johnson, Megan P, DO  traMADol (ULTRAM) 50 MG tablet Take 1 tablet (50 mg total) by mouth every 12 (twelve) hours as needed. 05/16/18   Sable Feil, PA-C  venlafaxine XR (EFFEXOR-XR) 37.5 MG 24 hr capsule TAKE 1 CAPSULE(37.5 MG) BY MOUTH TWICE DAILY 02/14/18   Park Liter P, DO    Allergies Codeine; Latex; Morphine; Nalbuphine; Ondansetron hcl; Other; Penicillins; Codeine sulfate; Metoclopramide; Prochlorperazine; Prochlorperazine edisylate; Promethazine; Tape; and Tizanidine  Family History  Problem Relation Age of Onset  . Arthritis Mother   . Hyperlipidemia Mother   . Hypertension Mother   . Migraines Mother   . Thrombophilia Mother   . Arthritis Father   . Autism Son   . Cancer Maternal Grandfather        pancreatic  . Stroke Maternal Grandfather   . Diabetes Paternal Grandmother   . Graves' disease Maternal Aunt     Social History Social History   Tobacco Use  . Smoking status: Current Every Day Smoker    Packs/day: 0.50    Years: 3.00    Pack years: 1.50    Types: Cigarettes  . Smokeless tobacco: Never Used  Substance Use Topics  . Alcohol use: No    Alcohol/week: 0.0 standard drinks  . Drug use: No    Review of Systems Constitutional: No fever/chills Eyes: No visual changes. ENT: No sore throat. Cardiovascular: Denies chest pain. Respiratory: Denies shortness of breath. Gastrointestinal: No abdominal pain.  No nausea, no vomiting.  No diarrhea.  No constipation. Genitourinary:  Negative for dysuria. Musculoskeletal: Negative for back pain. Skin: Negative for rash. Neurological: Negative for headaches, focal weakness or numbness. Psychiatric:Anxiety Allergic/Immunilogical: See extensive medication list. ____________________________________________   PHYSICAL EXAM:  VITAL SIGNS: ED Triage Vitals [05/16/18 1400]  Enc Vitals Group     BP      Pulse      Resp      Temp      Temp src      SpO2      Weight 190 lb 11.2 oz (86.5 kg)     Height      Head Circumference      Peak Flow      Pain Score 6     Pain Loc      Pain Edu?      Excl. in [place]?    Constitutional: Alert and oriented. Well appearing and in no acute distress. Hematological/Lymphatic/Immunilogical: No cervical lymphadenopathy. Cardiovascular: Normal rate, regular  rhythm. Grossly normal heart sounds.  Good peripheral circulation. Respiratory: Normal respiratory effort.  No retractions. Lungs CTAB. Gastrointestinal: Soft and nontender. No Musculoskeletal: No lower extremity tenderness nor edema.  No joint effusions. Neurologic:  Normal speech and language. No gross focal neurologic deficits are appreciated. No gait instability. Skin:  Skin is warm, dry and intact. No rash noted. Psychiatric: Mood and affect are normal. Speech and behavior are normal.  ____________________________________________   LABS (all labs ordered are listed, but only abnormal results are displayed)  Labs Reviewed - No data to display ____________________________________________  EKG   ____________________________________________  RADIOLOGY  ED MD interpretation:    Official radiology report(s): No results found.  ____________________________________________   PROCEDURES  Procedure(s) performed: None  Procedures  Critical Care performed: No  ____________________________________________   INITIAL IMPRESSION / ASSESSMENT AND PLAN / ED COURSE  As part of my medical decision making, I reviewed  the following data within the electronic MEDICAL RECORD NUMBER    Left wrist and thumb pain secondary to tendinitis.  Patient given discharge care instruction.  Patient placed in a thumb spica.  Patient advised take medication as directed and follow-up with orthopedic if no improvement in 7 to 10 days.      ____________________________________________   FINAL CLINICAL IMPRESSION(S) / ED DIAGNOSES  Final diagnoses:  Tendinitis, de Quervain's     ED Discharge Orders         Ordered    naproxen (NAPROSYN) 500 MG tablet  2 times daily with meals     05/16/18 1434    traMADol (ULTRAM) 50 MG tablet  Every 12 hours PRN     05/16/18 1434           Note:  This document was prepared using Dragon voice recognition software and may include unintentional dictation errors.    Sable Feil, PA-C 05/16/18 1439    Earleen Newport, MD 05/16/18 1501

## 2018-05-16 NOTE — Discharge Instructions (Signed)
Wear thumb splint for 7 to 10 days while awake.  Take medication as directed.  Follow-up with orthopedic if no improvement in 10 days.

## 2018-05-25 ENCOUNTER — Other Ambulatory Visit: Payer: Self-pay | Admitting: Family Medicine

## 2018-06-22 ENCOUNTER — Telehealth: Payer: Self-pay | Admitting: Family Medicine

## 2018-06-22 NOTE — Telephone Encounter (Signed)
Pharmacy called to let Dr Wynetta Emery know that they only dispensed 60 capsules (19month supply) so pt is due for refill.   MEDICAL [address] [place], [place]  [place] [address]  Phone: [phone] Fax: [phone]

## 2018-06-22 NOTE — Telephone Encounter (Signed)
Requested medication (s) are due for refill today - may be too soon- Rx sent by pharmacy  Requested medication (s) are on the active medication list -yes  Future visit scheduled -yes  Last refill: written 02/14/18 1 RF  Notes to clinic:  Request for non delegated Rx- patient was given 6 month Rx in June- she should have medication until December.  Requested Prescriptions  Pending Prescriptions Disp Refills   pregabalin (LYRICA) 150 MG capsule [Pharmacy Med Name: PREGABALIN 150 MG CAP] 180 capsule     Sig: TAKE ONE CAPSULE BY MOUTH TWICE A DAY     Not Delegated - Neurology:  Anticonvulsants - Controlled Failed - 06/22/2018 10:04 AM      Failed - This refill cannot be delegated      Passed - Valid encounter within last 12 months    Recent Outpatient Visits          2 months ago Encounter for initial prescription of injectable contraceptive   [employer], Megan P, DO   4 months ago Acute cystitis without hematuria   [place], Megan P, DO   5 months ago Routine general medical examination at a health care facility   [facility], [place], DO   11 months ago Thyroid nodule   [place], [place], [place]   11 months ago Pain of right lower leg   [place], Lilia Argue, [place]      Future Appointments            In 1 month Wynetta Emery, Barb Merino, DO [place], [place]            Requested Prescriptions  Pending Prescriptions Disp Refills   pregabalin (LYRICA) 150 MG capsule [Pharmacy Med Name: PREGABALIN 150 MG CAP] 180 capsule     Sig: TAKE ONE CAPSULE BY MOUTH TWICE A DAY     Not Delegated - Neurology:  Anticonvulsants - Controlled Failed - 06/22/2018 10:04 AM      Failed - This refill cannot be delegated      Passed - Valid encounter within last 12 months    Recent Outpatient Visits          2 months ago Encounter for initial prescription of  injectable contraceptive   [place], Megan P, DO   4 months ago Acute cystitis without hematuria   [place], DO   5 months ago Routine general medical examination at a health care facility   [facility], [place], [place]   11 months ago Thyroid nodule   [place], [place]   11 months ago Pain of right lower leg   [place], Lilia Argue, [place]      Future Appointments            In 1 month Wynetta Emery, Barb Merino, DO [employer], [facility]

## 2018-06-23 ENCOUNTER — Other Ambulatory Visit: Payer: Self-pay | Admitting: Family Medicine

## 2018-06-23 NOTE — Telephone Encounter (Signed)
Requested medication (s) are due for refill today:   Yes  Requested medication (s) are on the active medication list:   Yes  Future visit scheduled:   Yes 08/12/18 with Dr. Wynetta Emery   Last ordered: 02/14/18  #180  1 refill   Requested Prescriptions  Pending Prescriptions Disp Refills   pregabalin (LYRICA) 150 MG capsule [Pharmacy Med Name: PREGABALIN 150 MG CAP] 180 capsule     Sig: TAKE ONE CAPSULE BY MOUTH TWICE A DAY     Not Delegated - Neurology:  Anticonvulsants - Controlled Failed - 06/23/2018 10:31 AM      Failed - This refill cannot be delegated      Passed - Valid encounter within last 12 months    Recent Outpatient Visits          2 months ago Encounter for initial prescription of injectable contraceptive   [place], Megan P, DO   4 months ago Acute cystitis without hematuria   [place], Megan P, DO   5 months ago Routine general medical examination at a health care facility   [facility] [place], [place], [place]   11 months ago Thyroid nodule   [place], [place], [place]   11 months ago Pain of right lower leg   [place], Lilia Argue, [place]      Future Appointments            In 1 month Wynetta Emery, Barb Merino, DO [employer], [place]

## 2018-06-23 NOTE — Telephone Encounter (Signed)
Please check with the pharmacy 180 pills with refill sent through in June- she should not be due until December.

## 2018-06-23 NOTE — Telephone Encounter (Signed)
Patient calling to check on status of getting medication because she only has one pill left. Advised Dr. Durenda Age cma is currently working on getting this resolved.

## 2018-06-23 NOTE — Telephone Encounter (Signed)
Called [facility]. Patient had it transferred to [facility] on [address]. It is only able to be transferred once. Called and let pt know she would have to go to [place] on [address] to pick it up. Pt verbalized understanding.

## 2018-07-02 ENCOUNTER — Encounter: Payer: Self-pay | Admitting: Emergency Medicine

## 2018-07-02 ENCOUNTER — Other Ambulatory Visit: Payer: Self-pay

## 2018-07-02 ENCOUNTER — Emergency Department
Admission: EM | Admit: 2018-07-02 | Discharge: 2018-07-02 | Disposition: A | Discharge status: Home / Self Care | Payer: Medicaid Other | Attending: Emergency Medicine | Admitting: Emergency Medicine

## 2018-07-02 DIAGNOSIS — F17210 Nicotine dependence, cigarettes, uncomplicated: Secondary | ICD-10-CM | POA: Diagnosis not present

## 2018-07-02 DIAGNOSIS — Z86718 Personal history of other venous thrombosis and embolism: Secondary | ICD-10-CM | POA: Diagnosis not present

## 2018-07-02 DIAGNOSIS — L509 Urticaria, unspecified: Secondary | ICD-10-CM | POA: Diagnosis not present

## 2018-07-02 DIAGNOSIS — Z79899 Other long term (current) drug therapy: Secondary | ICD-10-CM | POA: Insufficient documentation

## 2018-07-02 DIAGNOSIS — Z91040 Latex allergy status: Secondary | ICD-10-CM | POA: Insufficient documentation

## 2018-07-02 DIAGNOSIS — L299 Pruritus, unspecified: Secondary | ICD-10-CM | POA: Diagnosis present

## 2018-07-02 MED ORDER — DIPHENHYDRAMINE HCL 50 MG/ML IJ SOLN
25.0000 mg | Freq: Once | INTRAMUSCULAR | Status: AC
  Administered 2018-07-02: 25 mg via INTRAVENOUS
  Filled 2018-07-02: qty 1

## 2018-07-02 MED ORDER — HYDROXYZINE PAMOATE 25 MG PO CAPS: 25.0000 mg | ORAL_CAPSULE | Freq: Three times a day (TID) | ORAL | 0 refills | Status: AC | PRN

## 2018-07-02 MED ORDER — FAMOTIDINE 20 MG PO TABS: 20.0000 mg | ORAL_TABLET | Freq: Two times a day (BID) | ORAL | 0 refills | Status: DC

## 2018-07-02 MED ORDER — PREDNISONE 10 MG (21) PO TBPK: ORAL_TABLET | ORAL | 0 refills | Status: DC

## 2018-07-02 MED ORDER — FAMOTIDINE IN NACL 20-0.9 MG/50ML-% IV SOLN
20.0000 mg | Freq: Once | INTRAVENOUS | Status: AC
  Administered 2018-07-02: 20 mg via INTRAVENOUS
  Filled 2018-07-02: qty 50

## 2018-07-02 MED ORDER — METHYLPREDNISOLONE SODIUM SUCC 125 MG IJ SOLR
125.0000 mg | Freq: Once | INTRAMUSCULAR | Status: AC
  Administered 2018-07-02: 125 mg via INTRAVENOUS
  Filled 2018-07-02: qty 2

## 2018-07-02 NOTE — Discharge Instructions (Addendum)
Your exam is consistent with hives. The exact cause is not known, but may be related to your recent increase in medicine. I would suggest taking the antihistamines and steroids as directed. Restart your lower dose Effexor and consult with your provider for a slower titration schedule. Return to the ED as needed.

## 2018-07-02 NOTE — ED Notes (Signed)
Pt c/o systemic rash for 2 days, pt states she had recent increase in her Effexor and that's when she noticed the rash. C/o itching. Pt tried PO benadryl and taking a shower, no relief.

## 2018-07-02 NOTE — ED Provider Notes (Signed)
[facility] Emergency Department Provider Note ____________________________________________  Time seen: 1545  I have reviewed the triage vital signs and the nursing notes.  HISTORY  Chief Complaint  Pruritis  HPI Ellen Jackson is a 27 y.o. female who presents himself to the ED for evaluation of itching all over.  Patient describes only change of an increase in her Effexor for the last 3 days.  She was increased from 37.5 mg to 150 mg. She denies any other changes in her typical hygiene products, recent travel, or other exposures.  She describes a rash of generalized and persistent.  She had a single dose of Benadryl and took a shower with no relief prior to arrival.  He denies any difficulty breathing, swallowing, or controlling secretions.  She also denies any fevers, chills, chest pain, or shortness of breath.  Past Medical History:  Diagnosis Date  . Allergy   . Ankle fracture 06/20/2017  . Anxiety   . Closed right ankle fracture 06/19/2017  . DVT (deep venous thrombosis) ([place]) 07/13/2017   RLE  . DVT (deep venous thrombosis) ([place]) 09/13/2017  . DVT of popliteal vein ([place]) 07/13/2017  . Dysrhythmia   . Exertional dyspnea 10/14/2016  . Fibromyalgia    everywhere  . GERD (gastroesophageal reflux disease)   . Gonalgia 10/16/2014  . Heart palpitations 09/13/2017  . Hernia, umbilical   . Heterozygous MTHFR mutation C677T ([place])   . IBS (irritable bowel syndrome)   . LLQ abdominal pain 02/05/2015  . Migraine with aura    bc powders is only thing that works.  . Ovarian cyst   . Pain in joint involving lower leg 10/16/2014  . Pulmonary embolism ([employer]) 07/23/2017  . Recurrent umbilical hernia 7/62/8315  . Spina bifida occulta   . SVT (supraventricular tachycardia) [facility])     Patient Active Problem List   Diagnosis Date Noted  . Menorrhagia 04/19/2018  . Hx of deep vein thrombophlebitis of lower extremity 04/05/2018  . Hx of pulmonary embolus 04/05/2018  .  Dysmenorrhea 04/05/2018  . Thyroid nodule 10/06/2017  . S/P umbilical hernia repair, follow-up exam 10/19/2016  . Fibromyalgia 07/30/2016  . Headache, migraine 02/05/2015  . Heterozygous MTHFR mutation C677T ([place]) 02/05/2015  . 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 Paroxysmal supraventricular tachycardia ([place]) 02/05/2015  . Anxiety disorder 02/05/2015  . Major depressive disorder, single episode 02/05/2015    Past Surgical History:  Procedure Laterality Date  . APPENDECTOMY  2015  . CESAREAN SECTION  2011  . CHOLECYSTECTOMY  2005  . HERNIA REPAIR  1761   Umbilical Hernia Repair with mesh  . KNEE ARTHROSCOPY Left 2015   X 3. plate and pin in left shin placed after several surgeries. last surgery for this was 20154  . ORIF ANKLE FRACTURE Right 06/20/2017   Procedure: OPEN REDUCTION INTERNAL FIXATION (ORIF) TRIMALLEOLAR ANKLE FRACTURE LATERAL LIGAMENT RECONSTRUCTION;  Surgeon: Thornton Park, MD;  Location: [facility] ORS;  Service: Orthopedics;  Laterality: Right;  . OVARIAN CYST SURGERY    . UMBILICAL HERNIA REPAIR N/A 10/09/2016   Procedure: HERNIA REPAIR UMBILICAL ADULT;  Surgeon: Olean Ree, MD;  Location: [facility] ORS;  Service: General;  Laterality: N/A;    Prior to Admission medications   Medication Sig Start Date End Date Taking? Authorizing Provider  albuterol (PROVENTIL HFA;VENTOLIN HFA) 108 (90 Base) MCG/ACT inhaler Inhale 2 puffs into the lungs every 6 (six) hours as needed for wheezing or shortness of breath. Patient not taking: Reported on 12/03/2017 11/17/16   Park Liter  P, DO  cyclobenzaprine (FLEXERIL) 10 MG tablet Take 1 tablet (10 mg total) by mouth 3 (three) times daily as needed for muscle spasms. Patient not taking: Reported on 12/03/2017 10/16/16   Jules Husbands, MD  LORazepam (ATIVAN) 0.5 MG tablet Take 1 tablet (0.5 mg total) by mouth 2 (two) times daily as needed for anxiety. 07/21/17   Volney American, PA-C  metoprolol succinate (TOPROL-XL) 25 MG 24 hr tablet  TAKE 1 AND 1/2 TABLETS(37.5 MG) BY MOUTH DAILY 02/14/18   Wynetta Emery, Megan P, DO  naproxen (NAPROSYN) 500 MG tablet Take 1 tablet (500 mg total) by mouth 2 (two) times daily with a meal. 05/16/18   Sable Feil, PA-C  polyethylene glycol (MIRALAX / GLYCOLAX) packet Take 17 g by mouth 2 (two) times daily as needed for mild constipation.     [provider]  pregabalin (LYRICA) 150 MG capsule Take 1 capsule (150 mg total) by mouth 2 (two) times daily. 02/14/18   Johnson, Megan P, DO  ranitidine (ZANTAC) 150 MG tablet Take 1 tablet (150 mg total) by mouth daily as needed for heartburn. 02/14/18   Johnson, Megan P, DO  traMADol (ULTRAM) 50 MG tablet Take 1 tablet (50 mg total) by mouth every 12 (twelve) hours as needed. 05/16/18   Sable Feil, PA-C  venlafaxine XR (EFFEXOR-XR) 37.5 MG 24 hr capsule TAKE 1 CAPSULE(37.5 MG) BY MOUTH TWICE DAILY 02/14/18   Park Liter P, DO    Allergies Codeine; Latex; Morphine; Nalbuphine; Ondansetron hcl; Other; Penicillins; Codeine sulfate; Tramadol; Metoclopramide; Prochlorperazine; Prochlorperazine edisylate; Promethazine; Tape; and Tizanidine  Family History  Problem Relation Age of Onset  . Arthritis Mother   . Hyperlipidemia Mother   . Hypertension Mother   . Migraines Mother   . Thrombophilia Mother   . Arthritis Father   . Autism Son   . Cancer Maternal Grandfather        pancreatic  . Stroke Maternal Grandfather   . Diabetes Paternal Grandmother   . Graves' disease Maternal Aunt     Social History Social History   Tobacco Use  . Smoking status: Current Every Day Smoker    Packs/day: 0.50    Years: 3.00    Pack years: 1.50    Types: Cigarettes  . Smokeless tobacco: Never Used  Substance Use Topics  . Alcohol use: No    Alcohol/week: 0.0 standard drinks  . Drug use: No    Review of Systems  Constitutional: Negative for fever. Eyes: Negative for visual changes. ENT: Negative for sore throat. Cardiovascular: Negative for  chest pain. Respiratory: Negative for shortness of breath. Gastrointestinal: Negative for abdominal pain, vomiting and diarrhea. Genitourinary: Negative for dysuria. Musculoskeletal: Negative for back pain. Skin: Positive for rash. Neurological: Negative for headaches, focal weakness or numbness. ____________________________________________  PHYSICAL EXAM:  VITAL SIGNS: ED Triage Vitals  Enc Vitals Group     BP 07/02/18 1522 125/86     Pulse Rate 07/02/18 1522 91     Resp 07/02/18 1522 20     Temp 07/02/18 1522 98.2 F (36.8 C)     Temp Source 07/02/18 1522 Oral     SpO2 07/02/18 1522 98 %     Weight 07/02/18 1524 183 lb (83 kg)     Height 07/02/18 1524 5\' 7"  (1.702 m)     Head Circumference --      Peak Flow --      Pain Score 07/02/18 1523 7     Pain Loc --  Pain Edu? --      Excl. in [place]? --     Constitutional: Alert and oriented. Well appearing and in no distress. Head: Normocephalic and atraumatic. Eyes: Conjunctivae are normal. Normal extraocular movements Ears: Canals clear. TMs intact bilaterally. Nose: No congestion/rhinorrhea/epistaxis. Mouth/Throat: Mucous membranes are moist.  No oral lesions appreciated. Neck: Supple. No thyromegaly. Cardiovascular: Normal rate, regular rhythm. Normal distal pulses. Respiratory: Normal respiratory effort. No wheezes/rales/rhonchi. Musculoskeletal: Nontender with normal range of motion in all extremities.  Neurologic:  Normal gait without ataxia. Normal speech and language. No gross focal neurologic deficits are appreciated. Skin:  Skin is warm, dry and intact.  Patient with multiple scattered erythematous whelps noted across the upper torso and extremities. ____________________________________________  PROCEDURES  Procedures Solumedrol 125 mg IVP Famotidine 20 mg IVPB Diphenhydramine 25 mg IVP ____________________________________________  INITIAL IMPRESSION / ASSESSMENT AND PLAN / ED COURSE  Patient with ED  evaluation of pruritic hives noted to the trunk and extremities.  Patient clinical pictures confirms whelps consistent with urticaria.  The underlying etiology is not clear at this time.  This may represent an idiopathic urticaria that is just coincidental with the recent titration of her Effexor to a higher dose.  Patient is unclear of any other possible causes or triggers.  It was suggested that she reduce her dose of Effexor down to the previous dose and follow with primary provider next week for further evaluation and management.  She is discharged at this time with complete resolution of her pruritic hives. ____________________________________________  FINAL CLINICAL IMPRESSION(S) / ED DIAGNOSES  Final diagnoses:  Urticaria      Carmie End, Dannielle Karvonen, PA-C 07/02/18 2352    Lavonia Drafts, MD 07/03/18 1759

## 2018-07-02 NOTE — ED Triage Notes (Signed)
Itching all over. Only med change was increase in Effexor 3 days. Denies changes in soap, lotions etc. Denies new foods.

## 2018-07-20 ENCOUNTER — Other Ambulatory Visit: Payer: Self-pay

## 2018-07-20 ENCOUNTER — Encounter: Payer: Self-pay | Admitting: Emergency Medicine

## 2018-07-20 ENCOUNTER — Emergency Department: Payer: Medicaid Other

## 2018-07-20 ENCOUNTER — Emergency Department
Admission: EM | Admit: 2018-07-20 | Discharge: 2018-07-20 | Disposition: A | Discharge status: Home / Self Care | Payer: Medicaid Other | Attending: Emergency Medicine | Admitting: Emergency Medicine

## 2018-07-20 DIAGNOSIS — Y929 Unspecified place or not applicable: Secondary | ICD-10-CM | POA: Diagnosis not present

## 2018-07-20 DIAGNOSIS — Z79899 Other long term (current) drug therapy: Secondary | ICD-10-CM | POA: Diagnosis not present

## 2018-07-20 DIAGNOSIS — X500XXA Overexertion from strenuous movement or load, initial encounter: Secondary | ICD-10-CM | POA: Diagnosis not present

## 2018-07-20 DIAGNOSIS — Y9389 Activity, other specified: Secondary | ICD-10-CM | POA: Diagnosis not present

## 2018-07-20 DIAGNOSIS — F17210 Nicotine dependence, cigarettes, uncomplicated: Secondary | ICD-10-CM | POA: Insufficient documentation

## 2018-07-20 DIAGNOSIS — Z91040 Latex allergy status: Secondary | ICD-10-CM | POA: Insufficient documentation

## 2018-07-20 DIAGNOSIS — Q760 Spina bifida occulta: Secondary | ICD-10-CM | POA: Diagnosis not present

## 2018-07-20 DIAGNOSIS — Y999 Unspecified external cause status: Secondary | ICD-10-CM | POA: Insufficient documentation

## 2018-07-20 DIAGNOSIS — S76211A Strain of adductor muscle, fascia and tendon of right thigh, initial encounter: Secondary | ICD-10-CM | POA: Diagnosis not present

## 2018-07-20 DIAGNOSIS — S76219A Strain of adductor muscle, fascia and tendon of unspecified thigh, initial encounter: Secondary | ICD-10-CM

## 2018-07-20 DIAGNOSIS — S79911A Unspecified injury of right hip, initial encounter: Secondary | ICD-10-CM | POA: Diagnosis present

## 2018-07-20 MED ORDER — OXYCODONE-ACETAMINOPHEN 5-325 MG PO TABS
1.0000 | ORAL_TABLET | Freq: Once | ORAL | Status: AC
  Administered 2018-07-20: 1 via ORAL
  Filled 2018-07-20: qty 1

## 2018-07-20 MED ORDER — MELOXICAM 15 MG PO TABS: 15.0000 mg | ORAL_TABLET | Freq: Every day | ORAL | 1 refills | Status: DC

## 2018-07-20 NOTE — ED Triage Notes (Signed)
Pt went to pick up 27 year old who was having an autistic fit and felt something in right groin/hip pop.  Pain that radiates from right groin towards outwards of hip. Severe pain with ambulation. Difficult to assess in wheelchair but EMS denied shortening or rotation.

## 2018-07-20 NOTE — ED Provider Notes (Signed)
[facility] Emergency Department Provider Note  ____________________________________________  Time seen: Approximately 7:43 PM  I have reviewed the triage vital signs and the nursing notes.   HISTORY  Chief Complaint Hip Pain    HPI Ellen Jackson is a 27 y.o. female presents to the emergency department with acute 10 out of 10 right groin pain. Patient reports that pain occurred after patient was trying to restrain her 24 year old son who has autism.  Patient reports that she felt a sharp pop and reports that she cannot bear weight.  Patient has a history of osteopenia from chronic use of Depo-Provera injection.  Patient denies numbness or tingling in the lower extremities. No alleviating measures were attempted prior to presenting to the ED.    Past Medical History:  Diagnosis Date  . Allergy   . Ankle fracture 06/20/2017  . Anxiety   . Closed right ankle fracture 06/19/2017  . DVT (deep venous thrombosis) ([place]) 07/13/2017   RLE  . DVT (deep venous thrombosis) ([address]) 09/13/2017  . DVT of popliteal vein ([place]) 07/13/2017  . Dysrhythmia   . Exertional dyspnea 10/14/2016  . Fibromyalgia    everywhere  . GERD (gastroesophageal reflux disease)   . Gonalgia 10/16/2014  . Heart palpitations 09/13/2017  . Hernia, umbilical   . Heterozygous MTHFR mutation C677T ([place])   . IBS (irritable bowel syndrome)   . LLQ abdominal pain 02/05/2015  . Migraine with aura    bc powders is only thing that works.  . Ovarian cyst   . Pain in joint involving lower leg 10/16/2014  . Pulmonary embolism ([place]) 07/23/2017  . Recurrent umbilical hernia 8/75/6433  . Spina bifida occulta   . SVT (supraventricular tachycardia) [facility])     Patient Active Problem List   Diagnosis Date Noted  . Menorrhagia 04/19/2018  . Hx of deep vein thrombophlebitis of lower extremity 04/05/2018  . Hx of pulmonary embolus 04/05/2018  . Dysmenorrhea 04/05/2018  . Thyroid nodule 10/06/2017  . S/P  umbilical hernia repair, follow-up exam 10/19/2016  . Fibromyalgia 07/30/2016  . Headache, migraine 02/05/2015  . Heterozygous MTHFR mutation C677T ([place]) 02/05/2015  . 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 Paroxysmal supraventricular tachycardia ([place]) 02/05/2015  . Anxiety disorder 02/05/2015  . Major depressive disorder, single episode 02/05/2015    Past Surgical History:  Procedure Laterality Date  . APPENDECTOMY  2015  . CESAREAN SECTION  2011  . CHOLECYSTECTOMY  2005  . HERNIA REPAIR  2951   Umbilical Hernia Repair with mesh  . KNEE ARTHROSCOPY Left 2015   X 3. plate and pin in left shin placed after several surgeries. last surgery for this was 20154  . ORIF ANKLE FRACTURE Right 06/20/2017   Procedure: OPEN REDUCTION INTERNAL FIXATION (ORIF) TRIMALLEOLAR ANKLE FRACTURE LATERAL LIGAMENT RECONSTRUCTION;  Surgeon: Thornton Park, MD;  Location: [facility] ORS;  Service: Orthopedics;  Laterality: Right;  . OVARIAN CYST SURGERY    . UMBILICAL HERNIA REPAIR N/A 10/09/2016   Procedure: HERNIA REPAIR UMBILICAL ADULT;  Surgeon: Olean Ree, MD;  Location: [facility] ORS;  Service: General;  Laterality: N/A;    Prior to Admission medications   Medication Sig Start Date End Date Taking? Authorizing Provider  famotidine (PEPCID) 20 MG tablet Take 1 tablet (20 mg total) by mouth 2 (two) times daily for 7 days. 07/02/18 07/09/18  Menshew, Dannielle Karvonen, PA-C  LORazepam (ATIVAN) 0.5 MG tablet Take 1 tablet (0.5 mg total) by mouth 2 (two) times daily as needed for anxiety. 07/21/17  Volney American, PA-C  meloxicam (MOBIC) 15 MG tablet Take 1 tablet (15 mg total) by mouth daily for 7 days. 07/20/18 07/27/18  Lannie Fields, PA-C  metoprolol succinate (TOPROL-XL) 25 MG 24 hr tablet TAKE 1 AND 1/2 TABLETS(37.5 MG) BY MOUTH DAILY 02/14/18   Wynetta Emery, Megan P, DO  naproxen (NAPROSYN) 500 MG tablet Take 1 tablet (500 mg total) by mouth 2 (two) times daily with a meal. 05/16/18   Sable Feil, PA-C   polyethylene glycol (MIRALAX / GLYCOLAX) packet Take 17 g by mouth 2 (two) times daily as needed for mild constipation.     [provider]  predniSONE (STERAPRED UNI-PAK 21 TAB) 10 MG (21) TBPK tablet 6-day taper as directed. 07/02/18   Menshew, Dannielle Karvonen, PA-C  pregabalin (LYRICA) 150 MG capsule Take 1 capsule (150 mg total) by mouth 2 (two) times daily. 02/14/18   Johnson, Megan P, DO  traMADol (ULTRAM) 50 MG tablet Take 1 tablet (50 mg total) by mouth every 12 (twelve) hours as needed. 05/16/18   Sable Feil, PA-C  venlafaxine XR (EFFEXOR-XR) 37.5 MG 24 hr capsule TAKE 1 CAPSULE(37.5 MG) BY MOUTH TWICE DAILY 02/14/18   Park Liter P, DO    Allergies Codeine; Latex; Morphine; Nalbuphine; Ondansetron hcl; Other; Penicillins; Codeine sulfate; Tramadol; Metoclopramide; Prochlorperazine; Prochlorperazine edisylate; Promethazine; Tape; and Tizanidine  Family History  Problem Relation Age of Onset  . Arthritis Mother   . Hyperlipidemia Mother   . Hypertension Mother   . Migraines Mother   . Thrombophilia Mother   . Arthritis Father   . Autism Son   . Cancer Maternal Grandfather        pancreatic  . Stroke Maternal Grandfather   . Diabetes Paternal Grandmother   . Graves' disease Maternal Aunt     Social History Social History   Tobacco Use  . Smoking status: Current Every Day Smoker    Packs/day: 0.50    Years: 3.00    Pack years: 1.50    Types: Cigarettes  . Smokeless tobacco: Never Used  Substance Use Topics  . Alcohol use: No    Alcohol/week: 0.0 standard drinks  . Drug use: No     Review of Systems  Constitutional: No fever/chills Eyes: No visual changes. No discharge ENT: No upper respiratory complaints. Cardiovascular: no chest pain. Respiratory: no cough. No SOB. Gastrointestinal: No abdominal pain.  No nausea, no vomiting.  No diarrhea.  No constipation. Musculoskeletal: Patient has right hip pain.  Skin: Negative for rash, abrasions,  lacerations, ecchymosis. Neurological: Negative for headaches, focal weakness or numbness.   ____________________________________________   PHYSICAL EXAM:  VITAL SIGNS: ED Triage Vitals [07/20/18 1849]  Enc Vitals Group     BP 112/68     Pulse Rate 79     Resp 16     Temp 98.2 F (36.8 C)     Temp Source Oral     SpO2 100 %     Weight 187 lb (84.8 kg)     Height 5\' 7"  (1.702 m)     Head Circumference      Peak Flow      Pain Score 9     Pain Loc      Pain Edu?      Excl. in [place]?      Constitutional: Alert and oriented. Well appearing and in no acute distress. Eyes: Conjunctivae are normal. PERRL. EOMI. Head: Atraumatic. Cardiovascular: Normal rate, regular rhythm. Normal S1 and S2.  Good peripheral circulation. Respiratory: Normal respiratory effort without tachypnea or retractions. Lungs CTAB. Good air entry to the bases with no decreased or absent breath sounds. Gastrointestinal: Bowel sounds 4 quadrants. Soft and nontender to palpation. No guarding or rigidity. No palpable masses. No distention. No CVA tenderness. Musculoskeletal: Patient has mild pain with internal and external rotation of the right hip.  Pain is reproduced with resisted adduction of the right hip.  No pain with resisted abduction.  Palpable dorsalis pedis pulse, right. Neurologic:  Normal speech and language. No gross focal neurologic deficits are appreciated.  Skin:  Skin is warm, dry and intact. No rash noted. Psychiatric: Mood and affect are normal. Speech and behavior are normal. Patient exhibits appropriate insight and judgement.   ____________________________________________   LABS (all labs ordered are listed, but only abnormal results are displayed)  Labs Reviewed - No data to display ____________________________________________  EKG   ____________________________________________  RADIOLOGY I personally viewed and evaluated these images as part of my medical decision making, as  well as reviewing the written report by the radiologist.    Dg Hip Unilat W Or Wo Pelvis 2-3 Views Right  Result Date: 07/20/2018 CLINICAL DATA:  Right-sided hip pain EXAM: DG HIP (WITH OR WITHOUT PELVIS) 2-3V RIGHT COMPARISON:  None. FINDINGS: There is no evidence of hip fracture or dislocation. There is no evidence of arthropathy or other focal bone abnormality. IMPRESSION: Negative. Electronically Signed   By: Donavan Foil M.D.   On: 07/20/2018 20:16    ____________________________________________    PROCEDURES  Procedure(s) performed:    Procedures    Medications  oxyCODONE-acetaminophen (PERCOCET/ROXICET) 5-325 MG per tablet 1 tablet (1 tablet Oral Given 07/20/18 1959)     ____________________________________________   INITIAL IMPRESSION / ASSESSMENT AND PLAN / ED COURSE  Pertinent labs & imaging results that were available during my care of the patient were reviewed by me and considered in my medical decision making (see chart for details).  Review of the [place] CSRS was performed in accordance of the [place] prior to dispensing any controlled drugs.    Assessment and Plan:  Adductor strain Patient presents to the emergency department with right hip pain after trying to restrain 1 of her children.  X-ray examination reveals no acute bony abnormality.  Patient had reproducible pain with resisted adduction at the right hip, increasing suspicion for adductor muscle strain.  Patient was given crutches and  was discharged with meloxicam.  A referral was given orthopedics, Dr. Posey Pronto.  Vital signs are reassuring prior to discharge.  All patient questions were answered.   ____________________________________________  FINAL CLINICAL IMPRESSION(S) / ED DIAGNOSES  Final diagnoses:  Strain of adductor muscle of thigh      NEW MEDICATIONS STARTED DURING THIS VISIT:  ED Discharge Orders         Ordered    meloxicam (MOBIC) 15 MG tablet  Daily     07/20/18 2111               This chart was dictated using voice recognition software/Dragon. Despite best efforts to proofread, errors can occur which can change the meaning. Any change was purely unintentional.    Lannie Fields, PA-C 07/20/18 2355    Nance Pear, MD 07/21/18 0001

## 2018-07-25 ENCOUNTER — Ambulatory Visit: Payer: Medicaid Other | Admitting: Family Medicine

## 2018-07-25 ENCOUNTER — Encounter: Payer: Self-pay | Admitting: Family Medicine

## 2018-07-25 ENCOUNTER — Telehealth: Payer: Self-pay | Admitting: Family Medicine

## 2018-07-25 VITALS — BP 118/80 | HR 79 | Temp 98.7°F | Wt 189.8 lb

## 2018-07-25 DIAGNOSIS — L723 Sebaceous cyst: Secondary | ICD-10-CM

## 2018-07-25 DIAGNOSIS — M25551 Pain in right hip: Secondary | ICD-10-CM

## 2018-07-25 DIAGNOSIS — M797 Fibromyalgia: Secondary | ICD-10-CM

## 2018-07-25 DIAGNOSIS — K2900 Acute gastritis without bleeding: Secondary | ICD-10-CM

## 2018-07-25 DIAGNOSIS — Z23 Encounter for immunization: Secondary | ICD-10-CM | POA: Diagnosis not present

## 2018-07-25 MED ORDER — SUCRALFATE 1 GM/10ML PO SUSP: 1.0000 g | Freq: Three times a day (TID) | ORAL | 0 refills | Status: AC

## 2018-07-25 MED ORDER — OMEPRAZOLE 20 MG PO CPDR: 20.0000 mg | DELAYED_RELEASE_CAPSULE | Freq: Every day | ORAL | 3 refills | Status: AC

## 2018-07-25 MED ORDER — PREGABALIN 150 MG PO CAPS: 150.0000 mg | ORAL_CAPSULE | Freq: Two times a day (BID) | ORAL | 1 refills | Status: DC

## 2018-07-25 NOTE — Telephone Encounter (Signed)
PA submitted to [facility]

## 2018-07-25 NOTE — Patient Instructions (Addendum)
[address] [place], [place] [address]  Phone: [phone]   Influenza (Flu) Vaccine (Inactivated or Recombinant): What You Need to Know 1. Why get vaccinated? Influenza ("flu") is a contagious disease that spreads around the [place] every year, usually between October and May. Flu is caused by influenza viruses, and is spread mainly by coughing, sneezing, and close contact. Anyone can get flu. Flu strikes suddenly and can last several days. Symptoms vary by age, but can include:  fever/chills  sore throat  muscle aches  fatigue  cough  headache  runny or stuffy nose  Flu can also lead to pneumonia and blood infections, and cause diarrhea and seizures in children. If you have a medical condition, such as heart or lung disease, flu can make it worse. Flu is more dangerous for some people. Infants and young children, people 22 years of age and older, pregnant women, and people with certain health conditions or a weakened immune system are at greatest risk. Each year thousands of people in the [place] die from flu, and many more are hospitalized. Flu vaccine can:  keep you from getting flu,  make flu less severe if you do get it, and  keep you from spreading flu to your family and other people. 2. Inactivated and recombinant flu vaccines A dose of flu vaccine is recommended every flu season. Children 6 months through 19 years of age may need two doses during the same flu season. Everyone else needs only one dose each flu season. Some inactivated flu vaccines contain a very small amount of a mercury-based preservative called thimerosal. Studies have not shown thimerosal in vaccines to be harmful, but flu vaccines that do not contain thimerosal are available. There is no live flu virus in flu shots. They cannot cause the flu. There are many flu viruses, and they are always changing. Each year a new flu vaccine is made to protect against three or four viruses that are  likely to cause disease in the upcoming flu season. But even when the vaccine doesn't exactly match these viruses, it may still provide some protection. Flu vaccine cannot prevent:  flu that is caused by a virus not covered by the vaccine, or  illnesses that look like flu but are not.  It takes about 2 weeks for protection to develop after vaccination, and protection lasts through the flu season. 3. Some people should not get this vaccine Tell the person who is giving you the vaccine:  If you have any severe, life-threatening allergies. If you ever had a life-threatening allergic reaction after a dose of flu vaccine, or have a severe allergy to any part of this vaccine, you may be advised not to get vaccinated. Most, but not all, types of flu vaccine contain a small amount of egg protein.  If you ever had Guillain-Barr Syndrome (also called GBS). Some people with a history of GBS should not get this vaccine. This should be discussed with your doctor.  If you are not feeling well. It is usually okay to get flu vaccine when you have a mild illness, but you might be asked to come back when you feel better.  4. Risks of a vaccine reaction With any medicine, including vaccines, there is a chance of reactions. These are usually mild and go away on their own, but serious reactions are also possible. Most people who get a flu shot do not have any problems with it. Minor problems following a flu shot include:  soreness,  redness, or swelling where the shot was given  hoarseness  sore, red or itchy eyes  cough  fever  aches  headache  itching  fatigue  If these problems occur, they usually begin soon after the shot and last 1 or 2 days. More serious problems following a flu shot can include the following:  There may be a small increased risk of Guillain-Barre Syndrome (GBS) after inactivated flu vaccine. This risk has been estimated at 1 or 2 additional cases per million people  vaccinated. This is much lower than the risk of severe complications from flu, which can be prevented by flu vaccine.  Young children who get the flu shot along with pneumococcal vaccine (PCV13) and/or DTaP vaccine at the same time might be slightly more likely to have a seizure caused by fever. Ask your doctor for more information. Tell your doctor if a child who is getting flu vaccine has ever had a seizure.  Problems that could happen after any injected vaccine:  People sometimes faint after a medical procedure, including vaccination. Sitting or lying down for about 15 minutes can help prevent fainting, and injuries caused by a fall. Tell your doctor if you feel dizzy, or have vision changes or ringing in the ears.  Some people get severe pain in the shoulder and have difficulty moving the arm where a shot was given. This happens very rarely.  Any medication can cause a severe allergic reaction. Such reactions from a vaccine are very rare, estimated at about 1 in a million doses, and would happen within a few minutes to a few hours after the vaccination. As with any medicine, there is a very remote chance of a vaccine causing a serious injury or death. The safety of vaccines is always being monitored. For more information, visit: http://www.aguilar.org/ 5. What if there is a serious reaction? What should I look for? Look for anything that concerns you, such as signs of a severe allergic reaction, very high fever, or unusual behavior. Signs of a severe allergic reaction can include hives, swelling of the face and throat, difficulty breathing, a fast heartbeat, dizziness, and weakness. These would start a few minutes to a few hours after the vaccination. What should I do?  If you think it is a severe allergic reaction or other emergency that can't wait, call 9-1-1 and get the person to the nearest hospital. Otherwise, call your doctor.  Reactions should be reported to the Vaccine Adverse  Event Reporting System (VAERS). Your doctor should file this report, or you can do it yourself through the VAERS web site at www.vaers.SamedayNews.es, or by calling 650-219-5088. ? VAERS does not give medical advice. 6. The National Vaccine Injury Compensation Program The [employer] Vaccine Injury Compensation Program (VICP) is a federal program that was created to compensate people who may have been injured by certain vaccines. Persons who believe they may have been injured by a vaccine can learn about the program and about filing a claim by calling 907-602-9571 or visiting the [facility] website at GoldCloset.com.ee. There is a time limit to file a claim for compensation. 7. How can I learn more?  Ask your healthcare provider. He or she can give you the vaccine package insert or suggest other sources of information.  Call your local or state health department.  Contact the Centers for Disease Control and Prevention (CDC): ? Call 7090983679 (1-800-CDC-INFO) or ? Visit CDC's website at https://gibson.com/ Vaccine Information Statement, Inactivated Influenza Vaccine (03/30/2014) This information is not intended to  replace advice given to you by your health care provider. Make sure you discuss any questions you have with your health care provider. Document Released: 06/04/2006 Document Revised: 04/30/2016 Document Reviewed: 04/30/2016 Elsevier Interactive Patient Education  2017 [employer].

## 2018-07-25 NOTE — Progress Notes (Signed)
BP 118/80   Pulse 79   Temp 98.7 F (37.1 C) (Oral)   Wt 189 lb 12.8 oz (86.1 kg)   SpO2 97%   BMI 29.73 kg/m    Subjective:    Patient ID: Ellen Jackson, female    DOB: Nov 16, 1990, 27 y.o.   MRN: [id number]  HPI: Ellen Jackson is a 27 y.o. female  Chief Complaint  Patient presents with  . Abdominal Pain    pt states she has been having pains, states she gets upper abdominal pains after eating   . Medication Refill    pt states she needs a new RX sent to [facility] for Lyrica   ABDOMINAL PAIN  Duration: 2 weeks- thought it was heart burn Onset: gradual Severity: severe Quality: sharp and severe Location:  epigastric  Episode duration: 2-3 minutes Radiation: into her back Frequency: 2-3x a day, worse with eating Status: worse Treatments attempted: H2 Blocker Fever: no Nausea: yes Vomiting: no Weight loss: no Decreased appetite: no Diarrhea: no Constipation: no Blood in stool: no Heartburn: no Jaundice: no Rash: no Dysuria/urinary frequency: no Hematuria: no History of sexually transmitted disease: no Recurrent NSAID use: yes  FIBROMYALGIA Pain status: controlled Satisfied with current treatment?: yes Medication side effects: no Medication compliance: excellent compliance Duration: chronic Location: widespread Quality: aching and sore Current pain level: mild Previous pain level: moderate Aggravating factors: movement, walking and laying Alleviating factors: lyrica Previous pain specialty evaluation: no Non-narcotic analgesic meds: yes Narcotic contract:no  Hurt her R hip picking up her son- ER wanted her to see ortho. Needs a referral.   Sebaceous cyst on her chest is acting up. Never went to derm. Would like to go. Needs referral.  Relevant past medical, surgical, family and social history reviewed and updated as indicated. Interim medical history since our last visit reviewed. Allergies and medications reviewed and updated.  Review of Systems   Constitutional: Negative.   Respiratory: Negative.   Cardiovascular: Negative.   Musculoskeletal: Positive for arthralgias and myalgias. Negative for back pain, gait problem, joint swelling, neck pain and neck stiffness.  Skin: Positive for wound. Negative for color change, pallor and rash.  Neurological: Negative.   Psychiatric/Behavioral: Negative.     Per HPI unless specifically indicated above     Objective:    BP 118/80   Pulse 79   Temp 98.7 F (37.1 C) (Oral)   Wt 189 lb 12.8 oz (86.1 kg)   SpO2 97%   BMI 29.73 kg/m   Wt Readings from Last 3 Encounters:  07/25/18 189 lb 12.8 oz (86.1 kg)  07/20/18 187 lb (84.8 kg)  07/02/18 183 lb (83 kg)    Physical Exam  Constitutional: She is oriented to person, place, and time. She appears well-developed and well-nourished. No distress.  HENT:  Head: Normocephalic and atraumatic.  Right Ear: Hearing normal.  Left Ear: Hearing normal.  Nose: Nose normal.  Eyes: Conjunctivae and lids are normal. Right eye exhibits no discharge. Left eye exhibits no discharge. No scleral icterus.  Cardiovascular: Normal rate, regular rhythm, normal heart sounds and intact distal pulses. Exam reveals no gallop and no friction rub.  No murmur heard. Pulmonary/Chest: Effort normal and breath sounds normal. No stridor. No respiratory distress. She has no wheezes. She has no rhonchi. She has no rales. She exhibits no tenderness.  Abdominal: Soft. Normal appearance and bowel sounds are normal. She exhibits no shifting dullness, no distension, no pulsatile liver, no fluid wave, no abdominal bruit, no  ascites, no pulsatile midline mass and no mass. There is no tenderness.  Musculoskeletal: Normal range of motion.  Neurological: She is alert and oriented to person, place, and time.  Skin: Skin is warm, dry and intact. Capillary refill takes less than 2 seconds. No rash noted. She is not diaphoretic. No erythema. No pallor.  Psychiatric: She has a normal  mood and affect. Her speech is normal and behavior is normal. Judgment and thought content normal. Cognition and memory are normal.  Nursing note and vitals reviewed.   Results for orders placed or performed in visit on 04/05/18  Pregnancy, urine  Result Value Ref Range   Preg Test, Ur Negative Negative      Assessment & Plan:   Problem List Items Addressed This Visit      Other   Fibromyalgia    Under good control on current regimen. Continue current regimen. Continue to monitor. Call with any concerns. Refills given.         Other Visit Diagnoses    Acute gastritis, presence of bleeding unspecified, unspecified gastritis type    -  Primary   Will treat with omeprazole and carafate. Rx sent to her pharmcy. Recheck 1 month. Call with any concerns.    Sebaceous cyst       Referral generated today.   Relevant Orders   Ambulatory referral to Dermatology   Pain of right hip joint       Would like to see ortho. Referral generated today.   Relevant Orders   Ambulatory referral to Orthopedic Surgery   Need for influenza vaccination       Flu shot given today.   Relevant Orders   Flu Vaccine QUAD 36+ mos IM       Follow up plan: Return in about 4 weeks (around 08/22/2018) for on abdominal pain.

## 2018-07-25 NOTE — Telephone Encounter (Signed)
Copied from [place] [id number]. Topic: Quick Communication - See Telephone Encounter >> Jul 25, 2018  1:43 PM Conception Chancy, NT wrote: CRM for notification. See Telephone encounter for: 07/25/18.  Patient is calling and states the pharmacy is needing a prior authorization for pregabalin (LYRICA) 150 MG capsule. She would like it transferred to the pharmacy below as she states she is out of this medication and will get withdrawals if not refilled. Patient is going to contact the pharmacy and see if they will transfer it but wanted me to send a message as well.    MEDICAL [address] [place], [place] - [place] [address] Phone: [phone] Fax: [phone]

## 2018-07-25 NOTE — Assessment & Plan Note (Signed)
Under good control on current regimen. Continue current regimen. Continue to monitor. Call with any concerns. Refills given.   

## 2018-07-25 NOTE — Telephone Encounter (Signed)
Please do PA- if she can't get it today, i'll change Rx, but she should have had enough to last until 12/24 anyway

## 2018-07-26 NOTE — Telephone Encounter (Signed)
Lyrica approved for 365 days.  Confirmation #: [id number] Prior Approval #: [id number]

## 2018-07-26 NOTE — Telephone Encounter (Signed)
Called and left patient a VM letting her know that her RX was approved.

## 2018-08-04 ENCOUNTER — Encounter: Payer: Self-pay | Admitting: Family Medicine

## 2018-08-05 ENCOUNTER — Ambulatory Visit: Payer: Medicaid Other | Admitting: Family Medicine

## 2018-08-05 ENCOUNTER — Encounter: Payer: Self-pay | Admitting: Family Medicine

## 2018-08-05 ENCOUNTER — Other Ambulatory Visit: Payer: Self-pay

## 2018-08-05 VITALS — BP 117/81 | HR 84 | Temp 98.7°F | Ht 67.0 in | Wt 188.0 lb

## 2018-08-05 DIAGNOSIS — F321 Major depressive disorder, single episode, moderate: Secondary | ICD-10-CM

## 2018-08-05 DIAGNOSIS — I471 Supraventricular tachycardia: Secondary | ICD-10-CM | POA: Diagnosis not present

## 2018-08-05 DIAGNOSIS — M797 Fibromyalgia: Secondary | ICD-10-CM

## 2018-08-05 DIAGNOSIS — F1110 Opioid abuse, uncomplicated: Secondary | ICD-10-CM

## 2018-08-05 DIAGNOSIS — F419 Anxiety disorder, unspecified: Secondary | ICD-10-CM | POA: Diagnosis not present

## 2018-08-05 MED ORDER — PREGABALIN 150 MG PO CAPS: 150.0000 mg | ORAL_CAPSULE | Freq: Two times a day (BID) | ORAL | 1 refills | Status: AC

## 2018-08-05 MED ORDER — VENLAFAXINE HCL ER 37.5 MG PO CP24: ORAL_CAPSULE | ORAL | 1 refills | Status: AC

## 2018-08-05 MED ORDER — METOPROLOL SUCCINATE ER 25 MG PO TB24: ORAL_TABLET | ORAL | 1 refills | Status: AC

## 2018-08-05 NOTE — Assessment & Plan Note (Signed)
Under good control on current regimen. Continue current regimen. Continue to monitor. Call with any concerns. Refills given.   

## 2018-08-05 NOTE — Assessment & Plan Note (Signed)
In exacerbation due to recent arrest and admission that she has been abusing opiates. Will keep her on current dose right now and recheck in about a month. To see [facility]- they may manage her psychiatric medications. Call with any concerns.

## 2018-08-05 NOTE — Assessment & Plan Note (Signed)
Stable on her current regimen. She does well with the lyrica and it helps her to decrease her pain. Discussed weaning off of it- will not for right now and recheck 1 month. Discussed that pregabalin is lyrica- has not been started on gabapentin. Continue to monitor.

## 2018-08-05 NOTE — Assessment & Plan Note (Signed)
Based on the amount she was taking, does not sound like she will need detox. Will get her over to [facility] for evaluation and treatment. She is going over there now.

## 2018-08-05 NOTE — Progress Notes (Signed)
BP 117/81   Pulse 84   Temp 98.7 F (37.1 C) (Oral)   Ht 5\' 7"  (1.702 m)   Wt 188 lb (85.3 kg)   SpO2 99%   BMI 29.44 kg/m    Subjective:    Patient ID: Ellen Jackson, female    DOB: 12-Dec-1990, 27 y.o.   MRN: [id number]  HPI: Ellen Jackson is a 27 y.o. female  Chief Complaint  Patient presents with  . Medication Management    gabapentin and Effexor  . Drug Problem   Megha is here today for management of her medication. She notes that she has been abusing opiates. She states that she has been buying pain pills from someone. She was using percocets, using 10mg  she was taking 1 at a time, she was taking 1 a day- then a few days later she was taking more. She last used 5 days ago. She was arrested for this 2 days ago. Has to go to court.  She is having a lot of cravings, is wanting medication. She admits that she has a problem and would like to get help. She is very upset that this happened and that she has come to this.   She states that someone switched her from lyrica to gabapentin. She did not bring the bottle and does not know the dose.   DEPRESSION Mood status: exacerbated Satisfied with current treatment?: yes Symptom severity: severe  Duration of current treatment : chronic Side effects: no Medication compliance: good compliance Psychotherapy/counseling: no  Previous psychiatric medications: lyrica, effexor Depressed mood: yes Anxious mood: yes Anhedonia: no Significant weight loss or gain: no Insomnia: no  Fatigue: yes Feelings of worthlessness or guilt: yes Impaired concentration/indecisiveness: yes Suicidal ideations: yes Hopelessness: yes Crying spells: yes Depression screen [facility] 2/9 08/05/2018 02/14/2018 01/18/2018 07/02/2016 05/28/2016  Decreased Interest 2 1 1 1 1   Down, Depressed, Hopeless 2 1 1 1 2   PHQ - 2 Score 4 2 2 2 3   Altered sleeping 2 2 2  - 2  Tired, decreased energy 3 2 1  - 3  Change in appetite 3 1 0 - 2  Feeling bad or failure about  yourself  3 1 1  - 2  Trouble concentrating 2 1 1  - 3  Moving slowly or fidgety/restless 3 2 1  - 2  Suicidal thoughts 1 0 0 - 0  PHQ-9 Score 21 11 8  - 17  Difficult doing work/chores Very difficult Somewhat difficult Somewhat difficult - -   GAD 7 : Generalized Anxiety Score 08/05/2018 01/18/2018 07/02/2016 05/28/2016  Nervous, Anxious, on Edge 3 2 2 3   Control/stop worrying 3 1 1 2   Worry too much - different things 3 2 1 2   Trouble relaxing 3 2 2 3   Restless 3 1 2 2   Easily annoyed or irritable 3 2 2 3   Afraid - awful might happen 2 0 1 2  Total GAD 7 Score 20 10 11 17   Anxiety Difficulty Extremely difficult Somewhat difficult Somewhat difficult -    Relevant past medical, surgical, family and social history reviewed and updated as indicated. Interim medical history since our last visit reviewed. Allergies and medications reviewed and updated.  Review of Systems  Constitutional: Negative.   Respiratory: Negative.   Cardiovascular: Negative.   Musculoskeletal: Negative.   Skin: Negative.   Neurological: Negative.   Psychiatric/Behavioral: Positive for behavioral problems and dysphoric mood. Negative for agitation, confusion, decreased concentration, hallucinations, self-injury, sleep disturbance and suicidal ideas. The patient is nervous/anxious.  The patient is not hyperactive.     Per HPI unless specifically indicated above     Objective:    BP 117/81   Pulse 84   Temp 98.7 F (37.1 C) (Oral)   Ht 5\' 7"  (1.702 m)   Wt 188 lb (85.3 kg)   SpO2 99%   BMI 29.44 kg/m   Wt Readings from Last 3 Encounters:  08/05/18 188 lb (85.3 kg)  07/25/18 189 lb 12.8 oz (86.1 kg)  07/20/18 187 lb (84.8 kg)    Physical Exam Vitals signs and nursing note reviewed.  Constitutional:      General: She is not in acute distress.    Appearance: Normal appearance. She is not ill-appearing, toxic-appearing or diaphoretic.  HENT:     Head: Normocephalic and atraumatic.     Right Ear:  External ear normal.     Left Ear: External ear normal.     Nose: Nose normal.     Mouth/Throat:     Mouth: Mucous membranes are moist.     Pharynx: Oropharynx is clear.  Eyes:     General: No scleral icterus.       Right eye: No discharge.        Left eye: No discharge.     Extraocular Movements: Extraocular movements intact.     Conjunctiva/sclera: Conjunctivae normal.     Pupils: Pupils are equal, round, and reactive to light.  Neck:     Musculoskeletal: Normal range of motion and neck supple.  Cardiovascular:     Rate and Rhythm: Normal rate and regular rhythm.     Pulses: Normal pulses.     Heart sounds: Normal heart sounds. No murmur. No friction rub. No gallop.   Pulmonary:     Effort: Pulmonary effort is normal. No respiratory distress.     Breath sounds: Normal breath sounds. No stridor. No wheezing, rhonchi or rales.  Chest:     Chest wall: No tenderness.  Musculoskeletal: Normal range of motion.  Skin:    General: Skin is warm and dry.     Capillary Refill: Capillary refill takes less than 2 seconds.     Coloration: Skin is not jaundiced or pale.     Findings: No bruising, erythema, lesion or rash.  Neurological:     General: No focal deficit present.     Mental Status: She is alert and oriented to person, place, and time. Mental status is at baseline.  Psychiatric:        Mood and Affect: Mood is anxious.        Speech: Speech normal.        Behavior: Behavior normal.        Thought Content: Thought content normal.        Judgment: Judgment normal.     Results for orders placed or performed in visit on 04/05/18  Pregnancy, urine  Result Value Ref Range   Preg Test, Ur Negative Negative      Assessment & Plan:   Problem List Items Addressed This Visit      Cardiovascular and Mediastinum   Paroxysmal supraventricular tachycardia ([place])    Under good control on current regimen. Continue current regimen. Continue to monitor. Call with any concerns. Refills  given.        Relevant Medications   metoprolol succinate (TOPROL-XL) 25 MG 24 hr tablet     Other   Fibromyalgia    Stable on her current regimen. She does well with the lyrica and  it helps her to decrease her pain. Discussed weaning off of it- will not for right now and recheck 1 month. Discussed that pregabalin is lyrica- has not been started on gabapentin. Continue to monitor.       Anxiety disorder    In exacerbation due to recent arrest and admission that she has been abusing opiates. Will keep her on current dose right now and recheck in about a month. To see [facility]- they may manage her psychiatric medications. Call with any concerns.       Relevant Medications   venlafaxine XR (EFFEXOR-XR) 37.5 MG 24 hr capsule   Major depressive disorder, single episode    In exacerbation due to recent arrest and admission that she has been abusing opiates. Will keep her on current dose right now and recheck in about a month. To see [facility]- they may manage her psychiatric medications. Call with any concerns.       Relevant Medications   venlafaxine XR (EFFEXOR-XR) 37.5 MG 24 hr capsule   Opiate abuse, episodic ([place]) - Primary    Based on the amount she was taking, does not sound like she will need detox. Will get her over to [facility] for evaluation and treatment. She is going over there now.           Follow up plan: Return in about 4 weeks (around 09/02/2018) for follow up.

## 2018-08-12 ENCOUNTER — Ambulatory Visit: Payer: Medicaid Other | Admitting: Family Medicine

## 2018-09-12 ENCOUNTER — Ambulatory Visit: Payer: Medicaid Other | Admitting: Family Medicine

## 2018-09-12 NOTE — Progress Notes (Deleted)
   There were no vitals taken for this visit.   Subjective:    Patient ID: LUNDEN STIEBER, female    DOB: 23-Sep-1990, 28 y.o.   MRN: [id number]  HPI: JACKYE DEVER is a 28 y.o. female  No chief complaint on file.   Relevant past medical, surgical, family and social history reviewed and updated as indicated. Interim medical history since our last visit reviewed. Allergies and medications reviewed and updated.  Review of Systems  Per HPI unless specifically indicated above     Objective:    There were no vitals taken for this visit.  Wt Readings from Last 3 Encounters:  08/05/18 188 lb (85.3 kg)  07/25/18 189 lb 12.8 oz (86.1 kg)  07/20/18 187 lb (84.8 kg)    Physical Exam  Results for orders placed or performed in visit on 04/05/18  Pregnancy, urine  Result Value Ref Range   Preg Test, Ur Negative Negative      Assessment & Plan:   Problem List Items Addressed This Visit    None       Follow up plan: No follow-ups on file.

## 2018-09-20 ENCOUNTER — Encounter: Payer: Self-pay | Admitting: Family Medicine

## 2018-09-20 ENCOUNTER — Ambulatory Visit (INDEPENDENT_AMBULATORY_CARE_PROVIDER_SITE_OTHER): Payer: Medicaid Other | Admitting: Family Medicine

## 2018-09-20 ENCOUNTER — Other Ambulatory Visit: Payer: Self-pay

## 2018-09-20 VITALS — BP 116/78 | HR 92 | Temp 98.4°F | Ht 67.5 in | Wt 185.0 lb

## 2018-09-20 DIAGNOSIS — R197 Diarrhea, unspecified: Secondary | ICD-10-CM | POA: Diagnosis not present

## 2018-09-20 DIAGNOSIS — R1013 Epigastric pain: Secondary | ICD-10-CM

## 2018-09-20 NOTE — Progress Notes (Signed)
BP 116/78   Pulse 92   Temp 98.4 F (36.9 C) (Oral)   Ht 5' 7.5" (1.715 m)   Wt 185 lb (83.9 kg)   SpO2 100%   BMI 28.55 kg/m    Subjective:    Patient ID: Ellen Jackson, female    DOB: 03-May-1991, 28 y.o.   MRN: [id number]  HPI: Ellen Jackson is a 28 y.o. female  Chief Complaint  Patient presents with  . Abdominal Pain    x 6 days. pt states her upper abdominal pain radiated towards her back. states that about a month ago there was some blood in stool  . Nausea  . Diarrhea   ABDOMINAL ISSUES- had 2 episodes with blood in her stool at the end of December that then went away, has been having pain on and off since then.  Duration: constant past 6 days  Nature: gnawing Location: epigastric  Severity: 8-9/10  Radiation: to the L side and her back Episode duration: constant Frequency: constant Alleviating factors: nothing Aggravating factors: bending over, eating- after she eats Treatments attempted: carafate, omeprazole Constipation: no Diarrhea: yes- not using miralax Episodes of diarrhea/day: 2-3x a day Mucous in the stool: no Heartburn: no Bloating:yes Flatulence: yes Nausea: yes Vomiting: no Melena or hematochezia: no Rash: no Jaundice: no Fever: no Weight loss: yes   Relevant past medical, surgical, family and social history reviewed and updated as indicated. Interim medical history since our last visit reviewed. Allergies and medications reviewed and updated.  Review of Systems  Constitutional: Negative.   Respiratory: Negative.   Cardiovascular: Negative.   Gastrointestinal: Positive for abdominal distention, abdominal pain, blood in stool, diarrhea and nausea. Negative for anal bleeding, constipation, rectal pain and vomiting.  Musculoskeletal: Negative.   Skin: Negative.   Psychiatric/Behavioral: Negative.     Per HPI unless specifically indicated above     Objective:    BP 116/78   Pulse 92   Temp 98.4 F (36.9 C) (Oral)   Ht 5' 7.5"  (1.715 m)   Wt 185 lb (83.9 kg)   SpO2 100%   BMI 28.55 kg/m   Wt Readings from Last 3 Encounters:  09/20/18 185 lb (83.9 kg)  08/05/18 188 lb (85.3 kg)  07/25/18 189 lb 12.8 oz (86.1 kg)    Physical Exam Vitals signs and nursing note reviewed.  Constitutional:      General: She is not in acute distress.    Appearance: Normal appearance. She is not ill-appearing, toxic-appearing or diaphoretic.  HENT:     Head: Normocephalic and atraumatic.     Right Ear: External ear normal.     Left Ear: External ear normal.     Nose: Nose normal.     Mouth/Throat:     Mouth: Mucous membranes are moist.     Pharynx: Oropharynx is clear.  Eyes:     General: No scleral icterus.       Right eye: No discharge.        Left eye: No discharge.     Extraocular Movements: Extraocular movements intact.     Conjunctiva/sclera: Conjunctivae normal.     Pupils: Pupils are equal, round, and reactive to light.  Neck:     Musculoskeletal: Normal range of motion and neck supple.  Cardiovascular:     Rate and Rhythm: Normal rate and regular rhythm.     Pulses: Normal pulses.     Heart sounds: Normal heart sounds. No murmur. No friction rub. No gallop.  Pulmonary:     Effort: Pulmonary effort is normal. No respiratory distress.     Breath sounds: Normal breath sounds. No stridor. No wheezing, rhonchi or rales.  Chest:     Chest wall: No tenderness.  Abdominal:     General: Abdomen is flat. There is no distension or abdominal bruit. There are no signs of injury.     Palpations: Abdomen is soft. There is no shifting dullness, fluid wave, hepatomegaly, splenomegaly, mass or pulsatile mass.     Tenderness: There is abdominal tenderness in the epigastric area.     Hernia: No hernia is present.  Musculoskeletal: Normal range of motion.  Skin:    General: Skin is warm and dry.     Capillary Refill: Capillary refill takes less than 2 seconds.     Coloration: Skin is not jaundiced or pale.     Findings: No  bruising, erythema, lesion or rash.  Neurological:     General: No focal deficit present.     Mental Status: She is alert and oriented to person, place, and time. Mental status is at baseline.  Psychiatric:        Mood and Affect: Mood normal.        Behavior: Behavior normal.        Thought Content: Thought content normal.        Judgment: Judgment normal.     Results for orders placed or performed in visit on 04/05/18  Pregnancy, urine  Result Value Ref Range   Preg Test, Ur Negative Negative      Assessment & Plan:   Problem List Items Addressed This Visit    None    Visit Diagnoses    Epigastric pain    -  Primary   No better with carafate or omeprazole. Concern for ulcer. Will get her into GI. Checking labs today. Await results. Call with any concerns.    Relevant Orders   Comprehensive metabolic panel   CBC with Differential/Platelet   H. pylori antibody, IgG([facility])   Amylase   Lipase   Ambulatory referral to Gastroenterology   Ova and parasite examination   Stool C-Diff Toxin Assay   Stool Culture   Fecal leukocytes   Fecal occult blood, imunochemical([facility])   Diarrhea, unspecified type       Will send her home with stool studies and FOBT. Await results. Will get her into GI.    Relevant Orders   Ova and parasite examination   Stool C-Diff Toxin Assay   Stool Culture   Fecal leukocytes   Fecal occult blood, imunochemical([facility])       Follow up plan: Return Pending GI eval.

## 2018-09-20 NOTE — Patient Instructions (Addendum)
Thursday 09/22/18 1PM [facility] [place] [address] [place], [place] [address]  Main: [phone]  Dr. Chanetta Marshall   Food Choices to Help Relieve Diarrhea, Adult When you have diarrhea, the foods you eat and your eating habits are very important. Choosing the right foods and drinks can help:  Relieve diarrhea.  Replace lost fluids and nutrients.  Prevent dehydration. What general guidelines should I follow?  Relieving diarrhea  Choose foods with less than 2 g or .07 oz. of fiber per serving.  Limit fats to less than 8 tsp (38 g or 1.34 oz.) a day.  Avoid the following: ? Foods and beverages sweetened with high-fructose corn syrup, honey, or sugar alcohols such as xylitol, sorbitol, and mannitol. ? Foods that contain a lot of fat or sugar. ? Fried, greasy, or spicy foods. ? High-fiber grains, breads, and cereals. ? Raw fruits and vegetables.  Eat foods that are rich in probiotics. These foods include dairy products such as yogurt and fermented milk products. They help increase healthy bacteria in the stomach and intestines (gastrointestinal tract, or GI tract).  If you have lactose intolerance, avoid dairy products. These may make your diarrhea worse.  Take medicine to help stop diarrhea (antidiarrheal medicine) only as told by your health care provider. Replacing nutrients  Eat small meals or snacks every 3-4 hours.  Eat bland foods, such as white rice, toast, or baked potato, until your diarrhea starts to get better. Gradually reintroduce nutrient-rich foods as tolerated or as told by your health care provider. This includes: ? Well-cooked protein foods. ? Peeled, seeded, and soft-cooked fruits and vegetables. ? Low-fat dairy products.  Take vitamin and mineral supplements as told by your health care provider. Preventing dehydration  Start by sipping water or a special solution to prevent dehydration (oral rehydration solution, ORS).  Urine that is clear or pale yellow means that you are getting enough fluid.  Try to drink at least 8-10 cups of fluid each day to help replace lost fluids.  You may add other liquids in addition to water, such as clear juice or decaffeinated sports drinks, as tolerated or as told by your health care provider.  Avoid drinks with caffeine, such as coffee, tea, or soft drinks.  Avoid alcohol. What foods are recommended?     The items listed may not be a complete list. Talk with your health care provider about what dietary choices are best for you. Grains White rice. White, [place], or pita breads (fresh or toasted), including plain rolls, buns, or bagels. White pasta. Saltine, soda, or graham crackers. Pretzels. Low-fiber cereal. Cooked cereals made with water (such as cornmeal, farina, or cream cereals). Plain muffins. Matzo. Melba toast. Zwieback. Vegetables Potatoes (without the skin). Most well-cooked and canned vegetables without skins or seeds. Tender lettuce. Fruits Apple sauce. Fruits canned in juice. Cooked apricots, cherries, grapefruit, peaches, pears, or plums. Fresh bananas and cantaloupe. Meats and other protein foods Baked or boiled chicken. Eggs. Tofu. Fish. Seafood. Smooth nut butters. Ground or well-cooked tender beef, ham, veal, lamb, pork, or poultry. Dairy Plain yogurt, kefir, and unsweetened liquid yogurt. Lactose-free milk, buttermilk, skim milk, or soy milk. Low-fat or nonfat hard cheese. Beverages Water. Low-calorie sports drinks. Fruit juices without pulp. Strained tomato and vegetable juices. Decaffeinated teas. Sugar-free beverages not sweetened with sugar alcohols. Oral rehydration solutions, if approved by your health care provider. Seasoning and other foods Bouillon, broth, or soups made from recommended foods. What foods are not recommended? The  items listed may not be a complete list. Talk with your health care provider about what dietary choices are best for  you. Grains Whole grain, whole wheat, bran, or rye breads, rolls, pastas, and crackers. Wild or brown rice. Whole grain or bran cereals. Barley. Oats and oatmeal. Corn tortillas or taco shells. Granola. Popcorn. Vegetables Raw vegetables. Fried vegetables. Cabbage, broccoli, Brussels sprouts, artichokes, baked beans, beet greens, corn, kale, legumes, peas, sweet potatoes, and yams. Potato skins. Cooked spinach and cabbage. Fruits Dried fruit, including raisins and dates. Raw fruits. Stewed or dried prunes. Canned fruits with syrup. Meat and other protein foods Fried or fatty meats. Deli meats. Chunky nut butters. Nuts and seeds. Beans and lentils. Berniece Salines. Hot dogs. Sausage. Dairy High-fat cheeses. Whole milk, chocolate milk, and beverages made with milk, such as milk shakes. Half-and-half. Cream. sour cream. Ice cream. Beverages Caffeinated beverages (such as coffee, tea, soda, or energy drinks). Alcoholic beverages. Fruit juices with pulp. Prune juice. Soft drinks sweetened with high-fructose corn syrup or sugar alcohols. High-calorie sports drinks. Fats and oils Butter. Cream sauces. Margarine. Salad oils. Plain salad dressings. Olives. Avocados. Mayonnaise. Sweets and desserts Sweet rolls, doughnuts, and sweet breads. Sugar-free desserts sweetened with sugar alcohols such as xylitol and sorbitol. Seasoning and other foods Honey. Hot sauce. Chili powder. Gravy. Cream-based or milk-based soups. Pancakes and waffles. Summary  When you have diarrhea, the foods you eat and your eating habits are very important.  Make sure you get at least 8-10 cups of fluid each day, or enough to keep your urine clear or pale yellow.  Eat bland foods and gradually reintroduce healthy, nutrient-rich foods as tolerated, or as told by your health care provider.  Avoid high-fiber, fried, greasy, or spicy foods. This information is not intended to replace advice given to you by your health care provider. Make  sure you discuss any questions you have with your health care provider. Document Released: 10/31/2003 Document Revised: 08/07/2016 Document Reviewed: 08/07/2016 Elsevier Interactive Patient Education  2019 [employer].

## 2018-09-20 NOTE — Addendum Note (Signed)
Addended by: Crissie Figures R on: 09/20/2018 02:40 PM   Modules accepted: Orders

## 2018-09-21 LAB — FECAL OCCULT BLOOD, IMMUNOCHEMICAL: Fecal Occult Bld: NEGATIVE

## 2018-09-22 ENCOUNTER — Ambulatory Visit: Payer: Medicaid Other | Admitting: Gastroenterology

## 2018-09-22 ENCOUNTER — Encounter: Payer: Self-pay | Admitting: Gastroenterology

## 2018-09-22 VITALS — BP 112/77 | HR 112 | Ht 67.5 in | Wt 186.0 lb

## 2018-09-22 DIAGNOSIS — K625 Hemorrhage of anus and rectum: Secondary | ICD-10-CM | POA: Diagnosis not present

## 2018-09-22 DIAGNOSIS — K219 Gastro-esophageal reflux disease without esophagitis: Secondary | ICD-10-CM | POA: Diagnosis not present

## 2018-09-22 LAB — CBC WITH DIFFERENTIAL/PLATELET
BASOS ABS: 0.1 10*3/uL (ref 0.0–0.2)
BASOS: 1 %
EOS (ABSOLUTE): 1.9 10*3/uL — AB (ref 0.0–0.4)
Eos: 23 %
Hematocrit: 41.9 % (ref 34.0–46.6)
Hemoglobin: 14.1 g/dL (ref 11.1–15.9)
IMMATURE GRANS (ABS): 0 10*3/uL (ref 0.0–0.1)
IMMATURE GRANULOCYTES: 0 %
LYMPHS: 24 %
Lymphocytes Absolute: 2 10*3/uL (ref 0.7–3.1)
MCH: 31 pg (ref 26.6–33.0)
MCHC: 33.7 g/dL (ref 31.5–35.7)
MCV: 92 fL (ref 79–97)
MONOCYTES: 5 %
Monocytes Absolute: 0.4 10*3/uL (ref 0.1–0.9)
NEUTROS PCT: 47 %
Neutrophils Absolute: 4.1 10*3/uL (ref 1.4–7.0)
PLATELETS: 214 10*3/uL (ref 150–450)
RBC: 4.55 x10E6/uL (ref 3.77–5.28)
RDW: 13 % (ref 11.7–15.4)
WBC: 8.6 10*3/uL (ref 3.4–10.8)

## 2018-09-22 LAB — COMPREHENSIVE METABOLIC PANEL
ALBUMIN: 3.8 g/dL — AB (ref 3.9–5.0)
ALK PHOS: 114 IU/L (ref 39–117)
ALT: 24 IU/L (ref 0–32)
AST: 23 IU/L (ref 0–40)
Albumin/Globulin Ratio: 1.4 (ref 1.2–2.2)
BUN/Creatinine Ratio: 14 (ref 9–23)
BUN: 9 mg/dL (ref 6–20)
Bilirubin Total: 0.5 mg/dL (ref 0.0–1.2)
CALCIUM: 9.1 mg/dL (ref 8.7–10.2)
CHLORIDE: 105 mmol/L (ref 96–106)
CO2: 21 mmol/L (ref 20–29)
Creatinine, Ser: 0.65 mg/dL (ref 0.57–1.00)
GFR calc Af Amer: 138 mL/min/{1.73_m2} (ref >59)
GFR, EST NON AFRICAN AMERICAN: 120 mL/min/{1.73_m2} (ref >59)
GLOBULIN, TOTAL: 2.7 g/dL (ref 1.5–4.5)
GLUCOSE: 83 mg/dL (ref 65–99)
Potassium: 4 mmol/L (ref 3.5–5.2)
Sodium: 142 mmol/L (ref 134–144)
TOTAL PROTEIN: 6.5 g/dL (ref 6.0–8.5)

## 2018-09-22 LAB — CLOSTRIDIUM DIFFICILE EIA: C DIFFICILE TOXINS A+ B, EIA: NEGATIVE

## 2018-09-22 LAB — LIPASE: LIPASE: 26 U/L (ref 14–72)

## 2018-09-22 LAB — H. PYLORI ANTIBODY, IGG: H. pylori, IgG AbS: 0.2 Index Value (ref 0.00–0.79)

## 2018-09-22 LAB — AMYLASE: AMYLASE: 31 U/L (ref 31–110)

## 2018-09-22 NOTE — Progress Notes (Signed)
[facility] [address]  [place], [place] [address]  Main: [phone]  Fax: [phone]   Gastroenterology Consultation  Referring Provider:     Valerie Roys, DO Primary Care Physician:  Valerie Roys, DO Reason for Consultation:     Epigastric pain        HPI:   Chief complaint: Abdominal pain  ARALYN NOWAK is a 28 y.o. y/o female referred for consultation & management  by Dr. Wynetta Emery, Megan P, DO.  Patient seen by her primary care provider recently on September 20, 2018 reported epigastric pain that has not improved with Carafate or omeprazole prescribed by PCP.  She also reported diarrhea during her visit with them and they ordered stool studies.  Fecal occult blood test was negative.  Stool cultures pending, stool for C. difficile was negative.  CBC CMP were reassuring on September 12, 2018.  Patient reports midepigastric abdominal pain, nonradiating, sharp, 3/10, not associated with any nausea vomiting or weight loss.  States worsens at times with eating, bending.  Also reports about a month ago she had 2 episodes of bright red blood per rectum and she has never had this before and this concerned her as well.  No family history of colon cancer.  Patient has been previously seen by [facility] GI.  Last seen by them in 2017.  As per their note they were following her for IBS, GERD and she was being treated with Celexa for IBS, low FODMAP diet, psychiatry referral for stress/anxiety, Zantac for GERD.  She underwent an EGD with Dr. Rayann Heman in 2015 for abdominal pain.  1 cm tongue of salmon-colored mucosa was reported.  Duodenum and stomach was reported to be normal.  Pathology showed squamocolumnar mucosa with changes consistent with reflux.  As per their clinic notes, "Last colonoscopy: 2006 ([place]) - for abd pain, normal per patient"  Patient had a recent CT abdomen pelvis in February 2019 which did not show any significant abnormalities in the  abdomen or pelvis.  She also underwent pelvic ultrasound for abdominal pain which was unremarkable.  Her medication list includes omeprazole and MiraLAX.  Past Medical History:  Diagnosis Date  . Allergy   . Ankle fracture 06/20/2017  . Anxiety   . Closed right ankle fracture 06/19/2017  . DVT (deep venous thrombosis) ([place]) 07/13/2017   RLE  . DVT (deep venous thrombosis) ([place]) 09/13/2017  . DVT of popliteal vein ([place]) 07/13/2017  . Dysrhythmia   . Exertional dyspnea 10/14/2016  . Fibromyalgia    everywhere  . GERD (gastroesophageal reflux disease)   . Gonalgia 10/16/2014  . Heart palpitations 09/13/2017  . Hernia, umbilical   . Heterozygous MTHFR mutation C677T ([place])   . IBS (irritable bowel syndrome)   . LLQ abdominal pain 02/05/2015  . Migraine with aura    bc powders is only thing that works.  . Ovarian cyst   . Pain in joint involving lower leg 10/16/2014  . Pulmonary embolism ([facility] [place]) 07/23/2017  . Recurrent umbilical hernia 1/63/8466  . Spina bifida occulta   . SVT (supraventricular tachycardia) ([facility])     Past Surgical History:  Procedure Laterality Date  . APPENDECTOMY  2015  . CESAREAN SECTION  2011  . CHOLECYSTECTOMY  2005  . HERNIA REPAIR  5993   Umbilical Hernia Repair with mesh  . KNEE ARTHROSCOPY Left 2015   X 3. plate and pin in left shin placed after several surgeries. last surgery for this was  20154  . ORIF ANKLE FRACTURE Right 06/20/2017   Procedure: OPEN REDUCTION INTERNAL FIXATION (ORIF) TRIMALLEOLAR ANKLE FRACTURE LATERAL LIGAMENT RECONSTRUCTION;  Surgeon: Thornton Park, MD;  Location: [facility] ORS;  Service: Orthopedics;  Laterality: Right;  . OVARIAN CYST SURGERY    . UMBILICAL HERNIA REPAIR N/A 10/09/2016   Procedure: HERNIA REPAIR UMBILICAL ADULT;  Surgeon: Olean Ree, MD;  Location: [facility] ORS;  Service: General;  Laterality: N/A;    Prior to Admission medications   Medication Sig Start Date End Date Taking? Authorizing Provider  metoprolol succinate  (TOPROL-XL) 25 MG 24 hr tablet TAKE 1 AND 1/2 TABLETS(37.5 MG) BY MOUTH DAILY 08/05/18   Johnson, Megan P, DO  omeprazole (PRILOSEC) 20 MG capsule Take 1 capsule (20 mg total) by mouth daily. 07/25/18   Johnson, Megan P, DO  polyethylene glycol (MIRALAX / GLYCOLAX) packet Take 17 g by mouth 2 (two) times daily as needed for mild constipation.     [provider]  pregabalin (LYRICA) 150 MG capsule Take 1 capsule (150 mg total) by mouth 2 (two) times daily. 08/05/18   Johnson, Megan P, DO  sucralfate (CARAFATE) 1 GM/10ML suspension Take 10 mLs (1 g total) by mouth 4 (four) times daily -  with meals and at bedtime. 07/25/18   Johnson, Megan P, DO  venlafaxine XR (EFFEXOR-XR) 37.5 MG 24 hr capsule TAKE 1 CAPSULE(37.5 MG) BY MOUTH TWICE DAILY 08/05/18   Valerie Roys, DO    Family History  Problem Relation Age of Onset  . Arthritis Mother   . Hyperlipidemia Mother   . Hypertension Mother   . Migraines Mother   . Thrombophilia Mother   . COPD Mother   . Asthma Mother   . Arthritis Father   . Autism Son   . Cancer Maternal Grandfather        pancreatic  . Stroke Maternal Grandfather   . Diabetes Paternal Grandmother   . Graves' disease Maternal Aunt      Social History   Tobacco Use  . Smoking status: Current Every Day Smoker    Packs/day: 0.50    Years: 3.00    Pack years: 1.50    Types: Cigarettes  . Smokeless tobacco: Never Used  Substance Use Topics  . Alcohol use: No    Alcohol/week: 0.0 standard drinks  . Drug use: No    Allergies as of 09/22/2018 - Review Complete 09/20/2018  Allergen Reaction Noted  . Codeine  02/04/2015  . Latex Itching and Rash 02/04/2015  . Morphine Itching and Shortness Of Breath 02/04/2015  . Nalbuphine Itching and Shortness Of Breath 02/04/2015  . Ondansetron hcl Hives and Rash 02/05/2015  . Other Hives 04/04/2015  . Penicillins Rash 02/04/2015  . Codeine sulfate Other (See Comments) 04/13/2014  . Tramadol Other (See Comments)  05/16/2018  . Metoclopramide Anxiety 02/04/2015  . Prochlorperazine Anxiety and Other (See Comments) 02/04/2015  . Prochlorperazine edisylate Anxiety 09/27/2014  . Promethazine Anxiety and Other (See Comments) 04/13/2014  . Tape  09/21/2016  . Tizanidine Other (See Comments) 04/13/2014    Review of Systems:    All systems reviewed and negative except where noted in HPI.   Physical Exam:   Vitals:   09/22/18 1313  BP: 112/77  Pulse: (!) 112  Weight: 186 lb (84.4 kg)  Height: 5' 7.5" (1.715 m)    No LMP recorded. Patient has had an injection. Psych:  Alert and cooperative. Normal mood and affect. General:   Alert,  Well-developed, well-nourished, pleasant and  cooperative in NAD Head:  Normocephalic and atraumatic. Eyes:  Sclera clear, no icterus.   Conjunctiva pink. Ears:  Normal auditory acuity. Nose:  No deformity, discharge, or lesions. Mouth:  No deformity or lesions,oropharynx pink & moist. Neck:  Supple; no masses or thyromegaly. Abdomen:  Normal bowel sounds.  No bruits.  Soft, non-tender and non-distended without masses, hepatosplenomegaly or hernias noted.  No guarding or rebound tenderness.    Msk:  Symmetrical without gross deformities. Good, equal movement & strength bilaterally. Pulses:  Normal pulses noted. Extremities:  No clubbing or edema.  No cyanosis. Neurologic:  Alert and oriented x3;  grossly normal neurologically. Skin:  Intact without significant lesions or rashes. No jaundice. Lymph Nodes:  No significant cervical adenopathy. Psych:  Alert and cooperative. Normal mood and affect.   Labs: CBC    Component Value Date/Time   WBC 8.6 09/20/2018 1157   WBC 9.1 08/03/2017 1649   RBC 4.55 09/20/2018 1157   RBC 4.30 08/03/2017 1649   HGB 14.1 09/20/2018 1157   HCT 41.9 09/20/2018 1157   PLT 214 09/20/2018 1157   MCV 92 09/20/2018 1157   MCV 93 07/20/2014 1147   MCH 31.0 09/20/2018 1157   MCH 30.6 08/03/2017 1649   MCHC 33.7 09/20/2018 1157    MCHC 33.7 08/03/2017 1649   RDW 13.0 09/20/2018 1157   RDW 12.4 07/20/2014 1147   LYMPHSABS 2.0 09/20/2018 1157   LYMPHSABS 2.1 12/18/2013 1002   MONOABS 0.6 06/19/2017 2324   MONOABS 0.4 12/18/2013 1002   EOSABS 1.9 (H) 09/20/2018 1157   EOSABS 0.4 12/18/2013 1002   BASOSABS 0.1 09/20/2018 1157   BASOSABS 0.0 12/18/2013 1002   CMP     Component Value Date/Time   NA 142 09/20/2018 1157   NA 140 07/20/2014 1147   K 4.0 09/20/2018 1157   K 3.3 (L) 07/20/2014 1147   CL 105 09/20/2018 1157   CL 109 (H) 07/20/2014 1147   CO2 21 09/20/2018 1157   CO2 24 07/20/2014 1147   GLUCOSE 83 09/20/2018 1157   GLUCOSE 112 (H) 08/03/2017 1649   GLUCOSE 91 07/20/2014 1147   BUN 9 09/20/2018 1157   BUN 12 07/20/2014 1147   CREATININE 0.65 09/20/2018 1157   CREATININE 0.74 07/20/2014 1147   CALCIUM 9.1 09/20/2018 1157   CALCIUM 8.5 07/20/2014 1147   PROT 6.5 09/20/2018 1157   PROT 7.3 07/20/2014 1147   ALBUMIN 3.8 (L) 09/20/2018 1157   ALBUMIN 3.9 07/20/2014 1147   AST 23 09/20/2018 1157   AST 13 (L) 07/20/2014 1147   ALT 24 09/20/2018 1157   ALT 17 07/20/2014 1147   ALKPHOS 114 09/20/2018 1157   ALKPHOS 80 07/20/2014 1147   BILITOT 0.5 09/20/2018 1157   BILITOT 1.1 (H) 07/20/2014 1147   GFRNONAA 120 09/20/2018 1157   GFRNONAA >60 07/20/2014 1147   GFRNONAA >60 12/18/2013 1002   GFRAA 138 09/20/2018 1157   GFRAA >60 07/20/2014 1147   GFRAA >60 12/18/2013 1002    Imaging Studies: No results found.  Assessment and Plan:   SHATIRA DOBOSZ is a 28 y.o. y/o female has been referred for abdominal pain, bright blood per rectum  Patient abdominal pain may be related to GERD Omeprazole has not helped We discussed continued conservative management with changing her PPI dosage or medication, versus proceeding with endoscopic evaluation Patient prefers to proceed with endoscopic evaluation before changing on her medications.  We also discussed that her bright red blood per rectum may be  due to hemorrhoids.  Risk of malignancy is low but not 0 and we discussed that we can proceed with colonoscopy for evaluation since she has not had one in years, versus conservative management for possible underlying hemorrhoids.  Patient prefers to proceed with colonoscopy along with her EGD as well.  I have discussed alternative options, risks & benefits,  which include, but are not limited to, bleeding, infection, perforation,respiratory complication & drug reaction.  The patient agrees with this plan & written consent will be obtained.    High-fiber diet MiraLAX or Metamucil daily with goal of 1-2 soft bowel movements daily.  If not at goal, patient instructed to increase dose to twice daily.  If loose stools with the medication, patient asked to decrease the medication to every other day, or half dose daily.  Patient verbalized understanding  Lab work otherwise reassuring.  No active bleeding since December 2019, no indication for urgent procedures at this time.  We will schedule for elective outpatient procedures as above.  Dr Vonda Antigua  Speech recognition software was used to dictate the above note.

## 2018-09-23 ENCOUNTER — Other Ambulatory Visit: Payer: Self-pay

## 2018-09-23 DIAGNOSIS — R1013 Epigastric pain: Secondary | ICD-10-CM

## 2018-09-23 DIAGNOSIS — K625 Hemorrhage of anus and rectum: Secondary | ICD-10-CM

## 2018-09-23 LAB — OVA AND PARASITE EXAMINATION

## 2018-09-24 LAB — STOOL CULTURE: E coli, Shiga toxin Assay: NEGATIVE

## 2018-09-24 LAB — FECAL LEUKOCYTES

## 2018-09-28 ENCOUNTER — Encounter
Admission: RE | Disposition: A | Discharge status: Home / Self Care | Payer: Self-pay | Source: Home / Self Care | Attending: Gastroenterology

## 2018-09-28 ENCOUNTER — Ambulatory Visit: Payer: Medicaid Other | Admitting: Anesthesiology

## 2018-09-28 ENCOUNTER — Ambulatory Visit
Admission: RE | Admit: 2018-09-28 | Discharge: 2018-09-28 | Disposition: A | Discharge status: Home / Self Care | Payer: Medicaid Other | Attending: Gastroenterology | Admitting: Gastroenterology

## 2018-09-28 DIAGNOSIS — F17210 Nicotine dependence, cigarettes, uncomplicated: Secondary | ICD-10-CM | POA: Insufficient documentation

## 2018-09-28 DIAGNOSIS — Z79899 Other long term (current) drug therapy: Secondary | ICD-10-CM | POA: Insufficient documentation

## 2018-09-28 DIAGNOSIS — K219 Gastro-esophageal reflux disease without esophagitis: Secondary | ICD-10-CM | POA: Diagnosis not present

## 2018-09-28 DIAGNOSIS — K228 Other specified diseases of esophagus: Secondary | ICD-10-CM

## 2018-09-28 DIAGNOSIS — Z86711 Personal history of pulmonary embolism: Secondary | ICD-10-CM | POA: Diagnosis not present

## 2018-09-28 DIAGNOSIS — D123 Benign neoplasm of transverse colon: Secondary | ICD-10-CM | POA: Diagnosis not present

## 2018-09-28 DIAGNOSIS — K635 Polyp of colon: Secondary | ICD-10-CM | POA: Diagnosis not present

## 2018-09-28 DIAGNOSIS — Z86718 Personal history of other venous thrombosis and embolism: Secondary | ICD-10-CM | POA: Insufficient documentation

## 2018-09-28 DIAGNOSIS — M797 Fibromyalgia: Secondary | ICD-10-CM | POA: Insufficient documentation

## 2018-09-28 DIAGNOSIS — K648 Other hemorrhoids: Secondary | ICD-10-CM | POA: Diagnosis not present

## 2018-09-28 DIAGNOSIS — R1013 Epigastric pain: Secondary | ICD-10-CM

## 2018-09-28 DIAGNOSIS — F419 Anxiety disorder, unspecified: Secondary | ICD-10-CM | POA: Diagnosis not present

## 2018-09-28 DIAGNOSIS — K2289 Other specified disease of esophagus: Secondary | ICD-10-CM

## 2018-09-28 DIAGNOSIS — K625 Hemorrhage of anus and rectum: Secondary | ICD-10-CM

## 2018-09-28 DIAGNOSIS — F329 Major depressive disorder, single episode, unspecified: Secondary | ICD-10-CM | POA: Diagnosis not present

## 2018-09-28 DIAGNOSIS — Q760 Spina bifida occulta: Secondary | ICD-10-CM | POA: Insufficient documentation

## 2018-09-28 DIAGNOSIS — K6389 Other specified diseases of intestine: Secondary | ICD-10-CM

## 2018-09-28 HISTORY — PX: ESOPHAGOGASTRODUODENOSCOPY (EGD) WITH PROPOFOL: SHX5813

## 2018-09-28 HISTORY — PX: COLONOSCOPY WITH PROPOFOL: SHX5780

## 2018-09-28 LAB — POCT PREGNANCY, URINE: Preg Test, Ur: NEGATIVE

## 2018-09-28 SURGERY — COLONOSCOPY WITH PROPOFOL
Anesthesia: General

## 2018-09-28 MED ORDER — FENTANYL CITRATE (PF) 100 MCG/2ML IJ SOLN
INTRAMUSCULAR | Status: AC
  Filled 2018-09-28: qty 2

## 2018-09-28 MED ORDER — PROPOFOL 500 MG/50ML IV EMUL
INTRAVENOUS | Status: DC | PRN
  Administered 2018-09-28: 125 ug/kg/min via INTRAVENOUS

## 2018-09-28 MED ORDER — PHENYLEPHRINE HCL 10 MG/ML IJ SOLN
INTRAMUSCULAR | Status: DC | PRN
  Administered 2018-09-28 (×2): 50 ug via INTRAVENOUS
  Administered 2018-09-28 (×2): 100 ug via INTRAVENOUS

## 2018-09-28 MED ORDER — SODIUM CHLORIDE (PF) 0.9 % IJ SOLN
INTRAMUSCULAR | Status: DC | PRN
  Administered 2018-09-28: 4 mL

## 2018-09-28 MED ORDER — PROPOFOL 500 MG/50ML IV EMUL
INTRAVENOUS | Status: AC
  Filled 2018-09-28: qty 50

## 2018-09-28 MED ORDER — LIDOCAINE HCL (CARDIAC) PF 100 MG/5ML IV SOSY
PREFILLED_SYRINGE | INTRAVENOUS | Status: DC | PRN
  Administered 2018-09-28: 60 mg via INTRAVENOUS
  Administered 2018-09-28: 40 mg via INTRAVENOUS

## 2018-09-28 MED ORDER — MIDAZOLAM HCL 2 MG/2ML IJ SOLN
INTRAMUSCULAR | Status: AC
  Filled 2018-09-28: qty 2

## 2018-09-28 MED ORDER — MIDAZOLAM HCL 2 MG/2ML IJ SOLN
INTRAMUSCULAR | Status: DC | PRN
  Administered 2018-09-28: 2 mg via INTRAVENOUS

## 2018-09-28 MED ORDER — FENTANYL CITRATE (PF) 100 MCG/2ML IJ SOLN
INTRAMUSCULAR | Status: DC | PRN
  Administered 2018-09-28 (×3): 25 ug via INTRAVENOUS
  Administered 2018-09-28 (×2): 12.5 ug via INTRAVENOUS

## 2018-09-28 MED ORDER — PROPOFOL 10 MG/ML IV BOLUS
INTRAVENOUS | Status: DC | PRN
  Administered 2018-09-28: 50 mg via INTRAVENOUS
  Administered 2018-09-28: 20 mg via INTRAVENOUS

## 2018-09-28 MED ORDER — SODIUM CHLORIDE 0.9 % IV SOLN
INTRAVENOUS | Status: DC
  Administered 2018-09-28: 10:00:00 via INTRAVENOUS

## 2018-09-28 NOTE — Anesthesia Preprocedure Evaluation (Addendum)
Anesthesia Evaluation  Patient identified by MRN, date of birth, ID band Patient awake    Reviewed: Allergy & Precautions, H&P , NPO status , Patient's Chart, lab work & pertinent test results  Airway Mallampati: II  TM Distance: <3 FB    Comment: Somewhat small mouth Dental  (+) Poor Dentition Crowded dentition:   Pulmonary neg pulmonary ROS, Current Smoker,           Cardiovascular + dysrhythmias (paroxysmal SVT, no episodes since starting metoprolol) Supra Ventricular Tachycardia      Neuro/Psych  Headaches, PSYCHIATRIC DISORDERS Anxiety Depression    GI/Hepatic Neg liver ROS, GERD  Controlled,  Endo/Other  negative endocrine ROS  Renal/GU negative Renal ROS  negative genitourinary   Musculoskeletal  (+) Fibromyalgia -  Abdominal   Peds  Hematology H/o DVT, PE following ankle fracture   Anesthesia Other Findings Past Medical History: No date: Allergy 06/20/2017: Ankle fracture No date: Anxiety 06/19/2017: Closed right ankle fracture 07/13/2017: DVT (deep venous thrombosis) ([place])     Comment:  RLE 09/13/2017: DVT (deep venous thrombosis) ([place]) 07/13/2017: DVT of popliteal vein ([facility]) No date: Dysrhythmia 10/14/2016: Exertional dyspnea No date: Fibromyalgia     Comment:  everywhere No date: GERD (gastroesophageal reflux disease) 10/16/2014: Evon Slack 09/13/2017: Heart palpitations No date: Hernia, umbilical No date: Heterozygous MTHFR mutation C677T ([place]) No date: IBS (irritable bowel syndrome) 02/05/2015: LLQ abdominal pain No date: Migraine with aura     Comment:  bc powders is only thing that works. No date: Ovarian cyst 10/16/2014: Pain in joint involving lower leg 07/23/2017: Pulmonary embolism ([place]) 3/55/9741: Recurrent umbilical hernia No date: Spina bifida occulta No date: SVT (supraventricular tachycardia) ([place])  Past Surgical History: 2015: APPENDECTOMY 2011: CESAREAN SECTION 2005:  CHOLECYSTECTOMY No date: COLONOSCOPY 2016: HERNIA REPAIR     Comment:  Umbilical Hernia Repair with mesh 2015: KNEE ARTHROSCOPY; Left     Comment:  X 3. plate and pin in left shin placed after several               surgeries. last surgery for this was 20154 06/20/2017: ORIF ANKLE FRACTURE; Right     Comment:  Procedure: OPEN REDUCTION INTERNAL FIXATION (ORIF)               TRIMALLEOLAR ANKLE FRACTURE LATERAL LIGAMENT               RECONSTRUCTION;  Surgeon: Thornton Park, MD;                Location: [facility] ORS;  Service: Orthopedics;  Laterality:               Right; No date: OVARIAN CYST SURGERY 6/38/4536: UMBILICAL HERNIA REPAIR; N/A     Comment:  Procedure: HERNIA REPAIR UMBILICAL ADULT;  Surgeon: Olean Ree, MD;  Location: [facility] ORS;  Service: General;                Laterality: N/A; No date: UPPER GI ENDOSCOPY  BMI    Body Mass Index:  28.98 kg/m      Reproductive/Obstetrics negative OB ROS                            Anesthesia Physical Anesthesia Plan  ASA: III  Anesthesia Plan: General   Post-op Pain Management:    Induction:   PONV Risk Score and Plan: Propofol infusion and TIVA  Airway  Management Planned: Natural Airway  Additional Equipment:   Intra-op Plan:   Post-operative Plan:   Informed Consent: I have reviewed the patients History and Physical, chart, labs and discussed the procedure including the risks, benefits and alternatives for the proposed anesthesia with the patient or authorized representative who has indicated his/her understanding and acceptance.     Dental Advisory Given  Plan Discussed with: Anesthesiologist and CRNA  Anesthesia Plan Comments:         Anesthesia Quick Evaluation

## 2018-09-28 NOTE — Op Note (Signed)
[facility] Gastroenterology Patient Name: Chele Cornell Procedure Date: 09/28/2018 11:35 AM MRN: [id number] Account #: [id number] Date of Birth: 19-Sep-1990 Admit Type: Outpatient Age: 28 Room: [facility] ENDO ROOM 4 Gender: Female Note Status: Finalized Procedure:            Colonoscopy Indications:          Rectal bleeding Providers:            Rosezella Kronick B. Bonna Gains MD, MD Medicines:            Monitored Anesthesia Care Complications:        No immediate complications. Procedure:            Pre-Anesthesia Assessment:                       - Prior to the procedure, a History and Physical was                        performed, and patient medications, allergies and                        sensitivities were reviewed. The patient's tolerance of                        previous anesthesia was reviewed.                       - The risks and benefits of the procedure and the                        sedation options and risks were discussed with the                        patient. All questions were answered and informed                        consent was obtained.                       - Patient identification and proposed procedure were                        verified prior to the procedure by the physician, the                        nurse, the anesthesiologist, the anesthetist and the                        technician. The procedure was verified in the procedure                        room.                       - ASA Grade Assessment: II - A patient with mild                        systemic disease.                       After obtaining informed consent, the colonoscope was  passed under direct vision. Throughout the procedure,                        the patient's blood pressure, pulse, and oxygen                        saturations were monitored continuously. The                        Colonoscope was introduced through the anus and     advanced to the the cecum, identified by appendiceal                        orifice and ileocecal valve. The colonoscopy was                        performed with ease. The patient tolerated the                        procedure well. The quality of the bowel preparation                        was fair. Findings:      The perianal and digital rectal examinations were normal.      A 9 mm polyp was found in the hepatic flexure. The polyp was flat. Area       was successfully injected with saline for lesion assessment, and this       injection appeared to lift the lesion adequately. The polyp was removed       with a cold snare. Resection and retrieval were complete. To prevent       bleeding after the polypectomy, one hemostatic clip was successfully       placed. There was no bleeding at the end of the procedure.      A localized area of mildly erythematous mucosa was found in the sigmoid       colon. Biopsies were taken with a cold forceps for histology.      The exam was otherwise without abnormality.      The rectum, sigmoid colon, descending colon, transverse colon, ascending       colon and cecum appeared normal.      Non-bleeding internal hemorrhoids were found during retroflexion. Impression:           - Preparation of the colon was fair.                       - One 9 mm polyp at the hepatic flexure, removed with a                        cold snare. Resected and retrieved. Injected. Clip was                        placed.                       - Erythematous mucosa in the sigmoid colon. Biopsied.                       - The examination was otherwise normal.                       -  The rectum, sigmoid colon, descending colon,                        transverse colon, ascending colon and cecum are normal.                       - Non-bleeding internal hemorrhoids. Recommendation:       - The hemorrhoids were the cause of patient's BRBPR in                        Dec 2019                        - If hemorrhoids become symptomatic again (asymptomatic                        at this time) can consider steroid suppositories vs                        referral for banding.                       - Discharge patient to home (with escort).                       - Advance diet as tolerated.                       - Continue present medications.                       - Await pathology results.                       - Repeat colonoscopy with 2 day prep, in 1 year if                        polyp pathology shows adenoma.                       - The findings and recommendations were discussed with                        the patient.                       - The findings and recommendations were discussed with                        the patient's family.                       - Return to primary care physician as previously                        scheduled.                       - High fiber diet. Procedure Code(s):    --- Professional ---                       [phone], Colonoscopy, flexible; with removal of tumor(s),  polyp(s), or other lesion(s) by snare technique                       45381, Colonoscopy, flexible; with directed submucosal                        injection(s), any substance                       45380, 22, Colonoscopy, flexible; with biopsy, single                        or multiple Diagnosis Code(s):    --- Professional ---                       K64.8, Other hemorrhoids                       D12.3, Benign neoplasm of transverse colon (hepatic                        flexure or splenic flexure)                       K63.89, Other specified diseases of intestine                       K62.5, Hemorrhage of anus and rectum CPT copyright 2018 American Medical Association. All rights reserved. The codes documented in this report are preliminary and upon coder review may  be revised to meet current compliance requirements.  Vonda Antigua, MD Margretta Sidle  B. Bonna Gains MD, MD 09/28/2018 12:41:55 PM This report has been signed electronically. Number of Addenda: 0 Note Initiated On: 09/28/2018 11:35 AM Scope Withdrawal Time: 0 hours 18 minutes 34 seconds  Total Procedure Duration: 0 hours 30 minutes 6 seconds  Estimated Blood Loss: Estimated blood loss: none.      [facility]

## 2018-09-28 NOTE — Anesthesia Post-op Follow-up Note (Signed)
Anesthesia QCDR form completed.        

## 2018-09-28 NOTE — H&P (Signed)
Vonda Antigua, MD [address] [place], [place], [place], [address] [address], [place], [place], [place], [address] Phone: [phone]  Fax: [phone]  Primary Care Physician:  Valerie Roys, DO   Pre-Procedure History & Physical: HPI:  Ellen Jackson is a 28 y.o. female is here for a colonoscopy and EGD.   Past Medical History:  Diagnosis Date  . Allergy   . Ankle fracture 06/20/2017  . Anxiety   . Closed right ankle fracture 06/19/2017  . DVT (deep venous thrombosis) ([place]) 07/13/2017   RLE  . DVT (deep venous thrombosis) ([place]) 09/13/2017  . DVT of popliteal vein ([place]) 07/13/2017  . Dysrhythmia   . Exertional dyspnea 10/14/2016  . Fibromyalgia    everywhere  . GERD (gastroesophageal reflux disease)   . Gonalgia 10/16/2014  . Heart palpitations 09/13/2017  . Hernia, umbilical   . Heterozygous MTHFR mutation C677T ([place])   . IBS (irritable bowel syndrome)   . LLQ abdominal pain 02/05/2015  . Migraine with aura    bc powders is only thing that works.  . Ovarian cyst   . Pain in joint involving lower leg 10/16/2014  . Pulmonary embolism ([place]) 07/23/2017  . Recurrent umbilical hernia 7/67/2094  . Spina bifida occulta   . SVT (supraventricular tachycardia) ([facility])     Past Surgical History:  Procedure Laterality Date  . APPENDECTOMY  2015  . CESAREAN SECTION  2011  . CHOLECYSTECTOMY  2005  . COLONOSCOPY    . HERNIA REPAIR  7096   Umbilical Hernia Repair with mesh  . KNEE ARTHROSCOPY Left 2015   X 3. plate and pin in left shin placed after several surgeries. last surgery for this was 20154  . ORIF ANKLE FRACTURE Right 06/20/2017   Procedure: OPEN REDUCTION INTERNAL FIXATION (ORIF) TRIMALLEOLAR ANKLE FRACTURE LATERAL LIGAMENT RECONSTRUCTION;  Surgeon: Thornton Park, MD;  Location: [facility] ORS;  Service: Orthopedics;  Laterality: Right;  . OVARIAN CYST SURGERY    . UMBILICAL HERNIA REPAIR N/A 10/09/2016   Procedure: HERNIA REPAIR UMBILICAL ADULT;  Surgeon:  Olean Ree, MD;  Location: [facility] ORS;  Service: General;  Laterality: N/A;  . UPPER GI ENDOSCOPY      Prior to Admission medications   Medication Sig Start Date End Date Taking? Authorizing Provider  metoprolol succinate (TOPROL-XL) 25 MG 24 hr tablet TAKE 1 AND 1/2 TABLETS(37.5 MG) BY MOUTH DAILY 08/05/18  Yes Johnson, Megan P, DO  omeprazole (PRILOSEC) 20 MG capsule Take 1 capsule (20 mg total) by mouth daily. 07/25/18  Yes Johnson, Megan P, DO  polyethylene glycol (MIRALAX / GLYCOLAX) packet Take 17 g by mouth 2 (two) times daily as needed for mild constipation.    Yes [provider]  pregabalin (LYRICA) 150 MG capsule Take 1 capsule (150 mg total) by mouth 2 (two) times daily. 08/05/18  Yes Johnson, Megan P, DO  sucralfate (CARAFATE) 1 GM/10ML suspension Take 10 mLs (1 g total) by mouth 4 (four) times daily -  with meals and at bedtime. 07/25/18  Yes Johnson, Megan P, DO  venlafaxine XR (EFFEXOR-XR) 37.5 MG 24 hr capsule TAKE 1 CAPSULE(37.5 MG) BY MOUTH TWICE DAILY 08/05/18  Yes Johnson, Megan P, DO    Allergies as of 09/23/2018 - Review Complete 09/22/2018  Allergen Reaction Noted  . Codeine  02/04/2015  . Latex Itching and Rash 02/04/2015  . Morphine Itching and Shortness Of Breath 02/04/2015  . Nalbuphine Itching and Shortness Of Breath 02/04/2015  . Ondansetron hcl Hives and Rash  02/05/2015  . Other Hives 04/04/2015  . Penicillins Rash 02/04/2015  . Codeine sulfate Other (See Comments) 04/13/2014  . Tramadol Other (See Comments) 05/16/2018  . Metoclopramide Anxiety 02/04/2015  . Prochlorperazine Anxiety and Other (See Comments) 02/04/2015  . Prochlorperazine edisylate Anxiety 09/27/2014  . Promethazine Anxiety and Other (See Comments) 04/13/2014  . Tape  09/21/2016  . Tizanidine Other (See Comments) 04/13/2014    Family History  Problem Relation Age of Onset  . Arthritis Mother   . Hyperlipidemia Mother   . Hypertension Mother   . Migraines Mother   .  Thrombophilia Mother   . COPD Mother   . Asthma Mother   . Arthritis Father   . Autism Son   . Cancer Maternal Grandfather        pancreatic  . Stroke Maternal Grandfather   . Diabetes Paternal Grandmother   . Graves' disease Maternal Aunt     Social History   Socioeconomic History  . Marital status: Single    Spouse name: Not on file  . Number of children: Not on file  . Years of education: Not on file  . Highest education level: Not on file  Occupational History  . Not on file  Social Needs  . Financial resource strain: Not on file  . Food insecurity:    Worry: Not on file    Inability: Not on file  . Transportation needs:    Medical: Not on file    Non-medical: Not on file  Tobacco Use  . Smoking status: Current Every Day Smoker    Packs/day: 0.50    Years: 3.00    Pack years: 1.50    Types: Cigarettes  . Smokeless tobacco: Never Used  Substance and Sexual Activity  . Alcohol use: No    Alcohol/week: 0.0 standard drinks  . Drug use: No  . Sexual activity: Never  Lifestyle  . Physical activity:    Days per week: Not on file    Minutes per session: Not on file  . Stress: Not on file  Relationships  . Social connections:    Talks on phone: Not on file    Gets together: Not on file    Attends religious service: Not on file    Active member of club or organization: Not on file    Attends meetings of clubs or organizations: Not on file    Relationship status: Not on file  . Intimate partner violence:    Fear of current or ex partner: Not on file    Emotionally abused: Not on file    Physically abused: Not on file    Forced sexual activity: Not on file  Other Topics Concern  . Not on file  Social History Narrative  . Not on file    Review of Systems: See HPI, otherwise negative ROS  Physical Exam: BP 112/68   Pulse 83   Temp 98.5 F (36.9 C) (Tympanic)   Resp 14   Ht 5\' 7"  (1.702 m)   Wt 83.9 kg   LMP 08/26/2018   SpO2 100%   BMI 28.98 kg/m    General:   Alert,  pleasant and cooperative in NAD Head:  Normocephalic and atraumatic. Neck:  Supple; no masses or thyromegaly. Lungs:  Clear throughout to auscultation, normal respiratory effort.    Heart:  +S1, +S2, Regular rate and rhythm, No edema. Abdomen:  Soft, nontender and nondistended. Normal bowel sounds, without guarding, and without rebound.   Neurologic:  Alert and  oriented x4;  grossly normal neurologically.  Impression/Plan: Ellen Jackson is here for a colonoscopy to be performed for BRBPR and EGD for abdominal pain and GERD.  Risks, benefits, limitations, and alternatives regarding the procedures have been reviewed with the patient.  Questions have been answered.  All parties agreeable.   Virgel Manifold, MD  09/28/2018, 10:31 AM

## 2018-09-28 NOTE — Op Note (Signed)
[facility] Gastroenterology Patient Name: Ellen Jackson Procedure Date: 09/28/2018 11:35 AM MRN: [id number] Account #: [id number] Date of Birth: 13-Sep-1990 Admit Type: Outpatient Age: 28 Room: [facility] ENDO ROOM 4 Gender: Female Note Status: Finalized Procedure:            Upper GI endoscopy Indications:          Epigastric abdominal pain Providers:            Jacqueline Delapena B. Bonna Gains MD, MD Medicines:            Monitored Anesthesia Care Complications:        No immediate complications. Procedure:            Pre-Anesthesia Assessment:                       - Prior to the procedure, a History and Physical was                        performed, and patient medications, allergies and                        sensitivities were reviewed. The patient's tolerance of                        previous anesthesia was reviewed.                       - The risks and benefits of the procedure and the                        sedation options and risks were discussed with the                        patient. All questions were answered and informed                        consent was obtained.                       - Patient identification and proposed procedure were                        verified prior to the procedure by the physician, the                        nurse, the anesthesiologist, the anesthetist and the                        technician. The procedure was verified in the procedure                        room.                       - ASA Grade Assessment: II - A patient with mild                        systemic disease.                       After obtaining informed consent, the endoscope was  passed under direct vision. Throughout the procedure,                        the patient's blood pressure, pulse, and oxygen                        saturations were monitored continuously. The Endoscope                        was introduced through the mouth, and  advanced to the                        second part of duodenum. The upper GI endoscopy was                        accomplished with ease. The patient tolerated the                        procedure well. Findings:      One tongue of salmon-colored mucosa was present from 39 to 40 cm. No       other visible abnormalities were present. The maximum longitudinal       extent of these esophageal mucosal changes was 1 cm in length. Mucosa       was biopsied with a cold forceps for histology in a targeted manner and       in 4 quadrants.      The entire examined stomach was normal. Biopsies were obtained in the       gastric body, at the incisura and in the gastric antrum with cold       forceps for histology. Biopsies were taken with a cold forceps for       Helicobacter pylori testing.      The duodenal bulb, second portion of the duodenum and examined duodenum       were normal. Impression:           - Salmon-colored mucosa suspicious for short-segment                        Barrett's esophagus. Biopsied.                       - Normal stomach. Biopsied.                       - Normal duodenal bulb, second portion of the duodenum                        and examined duodenum.                       - Biopsies were obtained in the gastric body, at the                        incisura and in the gastric antrum. Recommendation:       - Await pathology results.                       - Discharge patient to home (with escort).                       - Advance  diet as tolerated.                       - Continue present medications.                       - Patient has a contact number available for                        emergencies. The signs and symptoms of potential                        delayed complications were discussed with the patient.                        Return to normal activities tomorrow. Written discharge                        instructions were provided to the patient.                        - Discharge patient to home (with escort).                       - The findings and recommendations were discussed with                        the patient.                       - The findings and recommendations were discussed with                        the patient's family.                       - Follow an antireflux regimen. Procedure Code(s):    --- Professional ---                       [phone], Esophagogastroduodenoscopy, flexible, transoral;                        with biopsy, single or multiple Diagnosis Code(s):    --- Professional ---                       K22.8, Other specified diseases of esophagus                       R10.13, Epigastric pain CPT copyright 2018 American Medical Association. All rights reserved. The codes documented in this report are preliminary and upon coder review may  be revised to meet current compliance requirements.  Vonda Antigua, MD Margretta Sidle B. Bonna Gains MD, MD 09/28/2018 12:01:10 PM This report has been signed electronically. Number of Addenda: 0 Note Initiated On: 09/28/2018 11:35 AM Estimated Blood Loss: Estimated blood loss: none.      [facility]

## 2018-09-28 NOTE — Anesthesia Postprocedure Evaluation (Signed)
Anesthesia Post Note  Patient: Ellen Jackson  Procedure(s) Performed: COLONOSCOPY WITH PROPOFOL (N/A ) ESOPHAGOGASTRODUODENOSCOPY (EGD) WITH PROPOFOL (N/A )  Patient location during evaluation: Endoscopy Anesthesia Type: General Level of consciousness: awake and alert and oriented Pain management: pain level controlled Vital Signs Assessment: post-procedure vital signs reviewed and stable Respiratory status: spontaneous breathing, nonlabored ventilation and respiratory function stable Cardiovascular status: blood pressure returned to baseline and stable Postop Assessment: no signs of nausea or vomiting Anesthetic complications: no     Last Vitals:  Vitals:   09/28/18 1250 09/28/18 1300  BP: 116/71 113/76  Pulse: 81 70  Resp: 17 12  Temp:    SpO2: 100% 99%    Last Pain:  Vitals:   09/28/18 1300  TempSrc:   PainSc: 2                  Crista Nuon

## 2018-09-28 NOTE — Transfer of Care (Signed)
Immediate Anesthesia Transfer of Care Note  Patient: Ellen Jackson  Procedure(s) Performed: COLONOSCOPY WITH PROPOFOL (N/A ) ESOPHAGOGASTRODUODENOSCOPY (EGD) WITH PROPOFOL (N/A )  Patient Location: Endoscopy Unit  Anesthesia Type:General  Level of Consciousness: awake and drowsy  Airway & Oxygen Therapy: Patient Spontanous Breathing and Patient connected to nasal cannula oxygen  Post-op Assessment: Report given to RN and Post -op Vital signs reviewed and stable  Post vital signs: stable  Last Vitals:  Vitals Value Taken Time  BP 107/64 09/28/2018 12:40 PM  Temp 36.1 C 09/28/2018 12:40 PM  Pulse 88 09/28/2018 12:43 PM  Resp 22 09/28/2018 12:43 PM  SpO2 100 % 09/28/2018 12:43 PM  Vitals shown include unvalidated device data.  Last Pain:  Vitals:   09/28/18 1240  TempSrc: Tympanic  PainSc: 0-No pain         Complications: No apparent anesthesia complications

## 2018-09-29 LAB — SURGICAL PATHOLOGY

## 2018-09-30 ENCOUNTER — Encounter: Payer: Self-pay | Admitting: Gastroenterology

## 2018-10-27 IMAGING — DX DG ANKLE COMPLETE 3+V*R*
3 series · 3 of 3 positions shown · non-contrast
Comparison: 06/19/2017

CLINICAL DATA: Post reduction

EXAM:
RIGHT ANKLE - COMPLETE 3+ VIEW

[ankle ap]
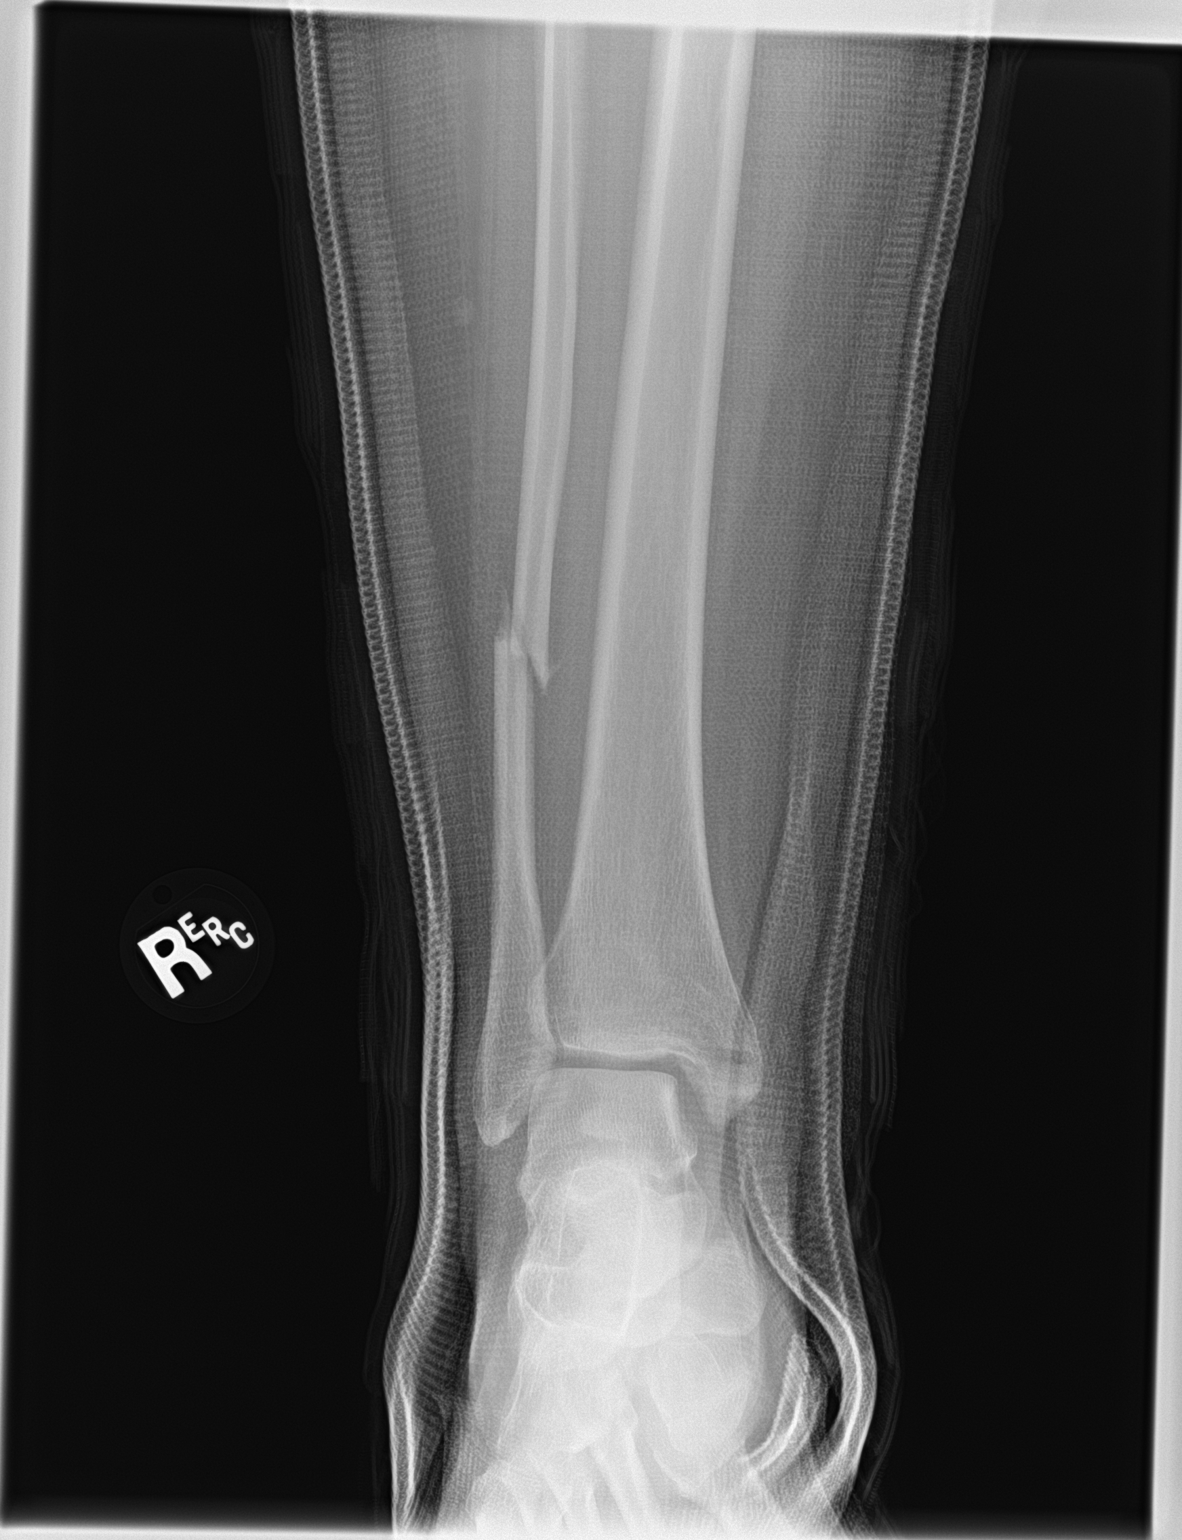

[ankle obl]
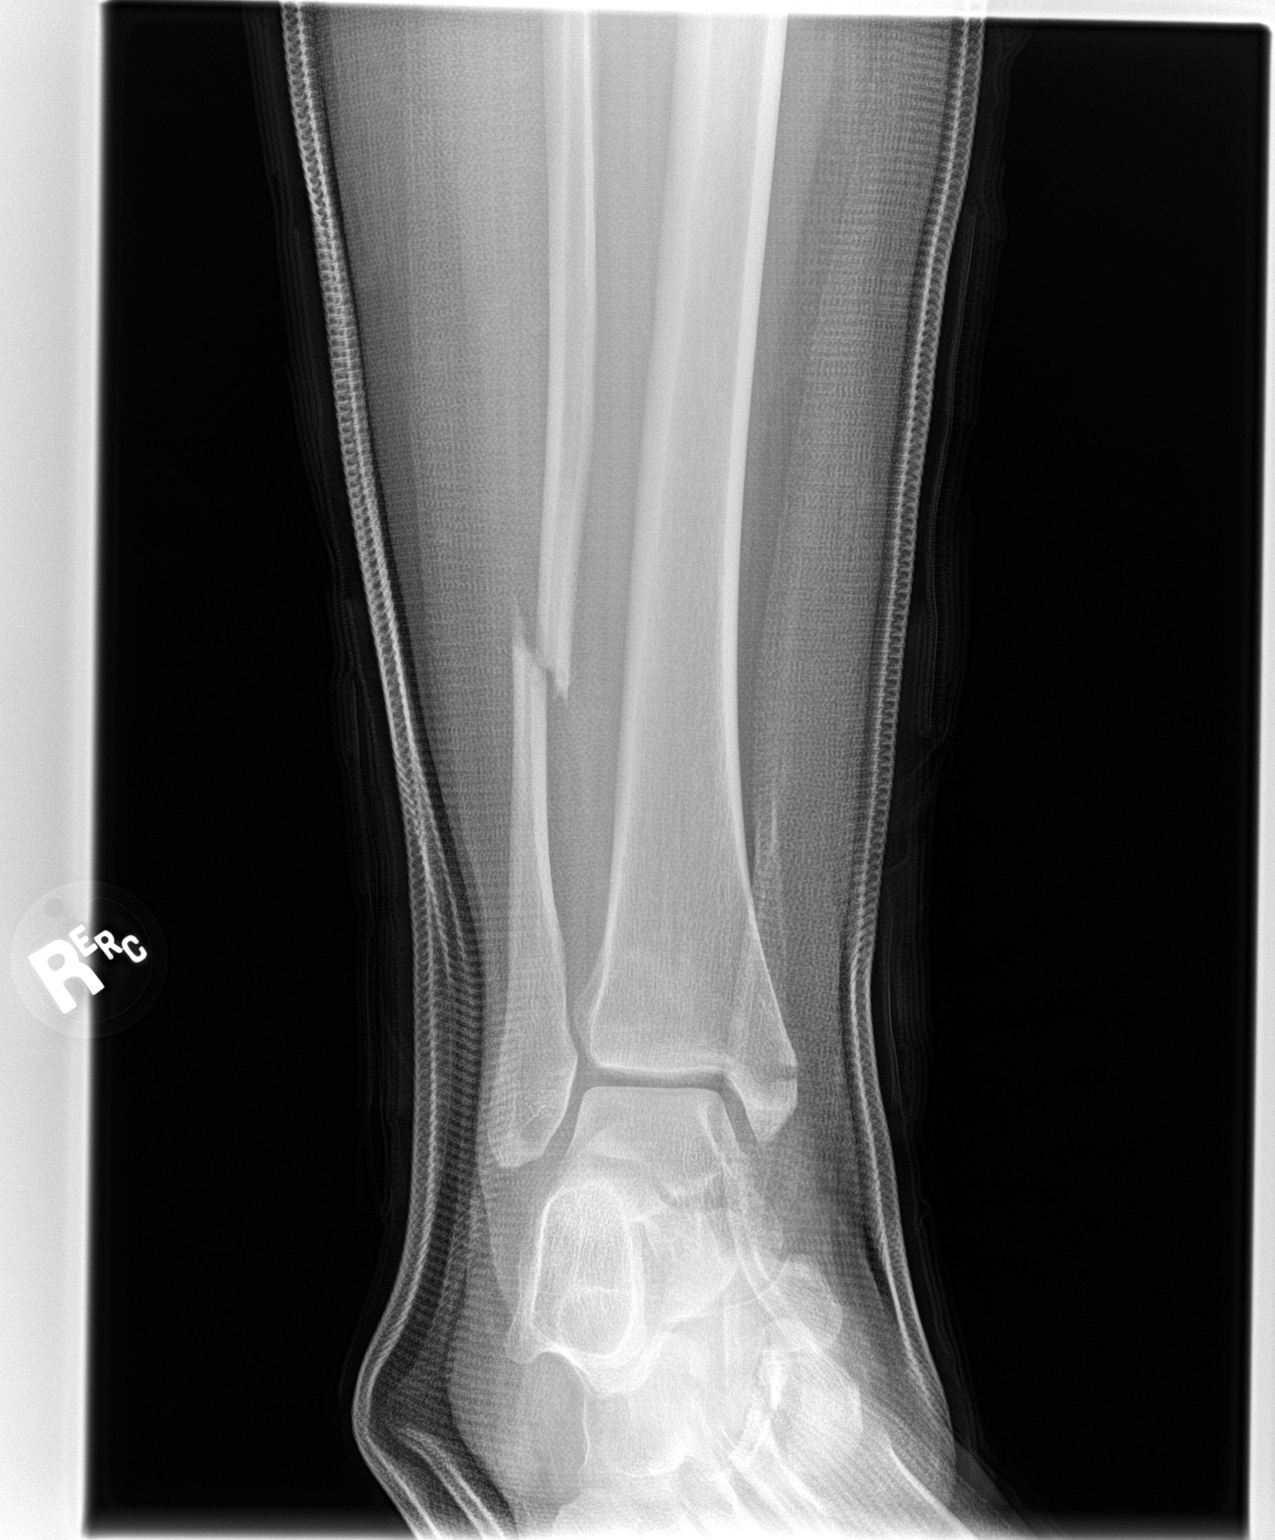

[ankle lat]
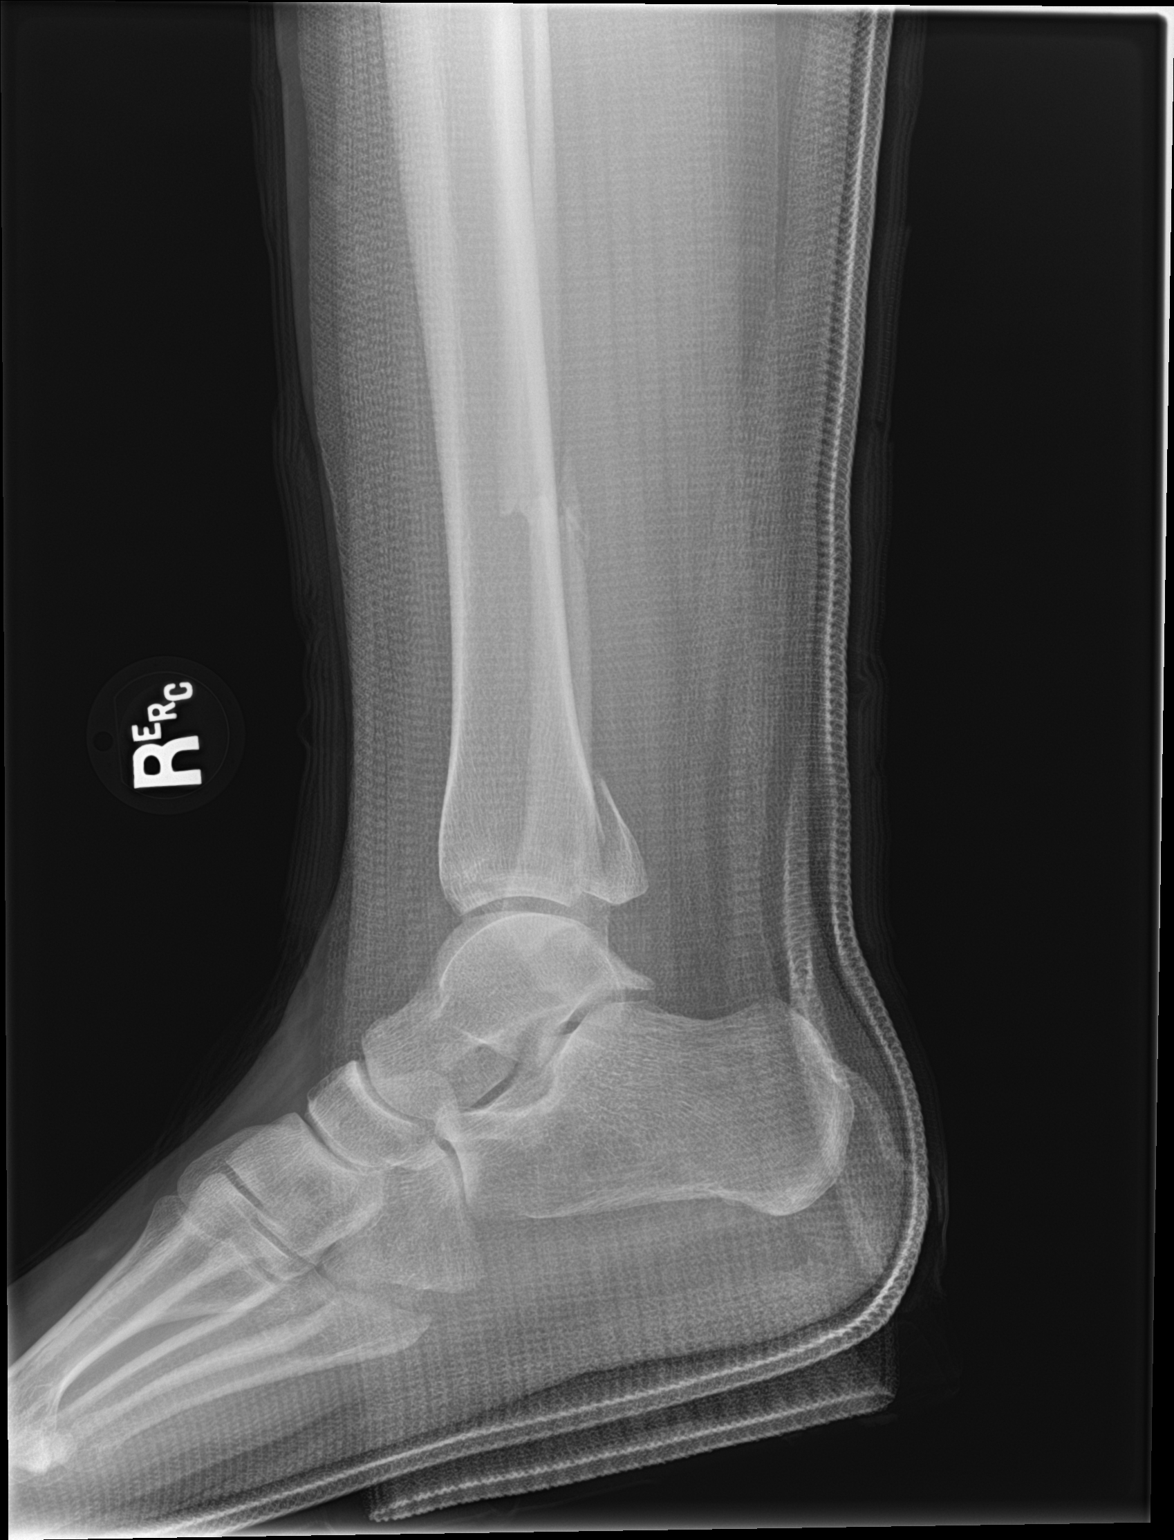

[3 of 3 positions shown; findings below may reference images not displayed]

FINDINGS: Interval placement of cast material which obscures bone detail.
Slightly comminuted fracture involving the distal shaft of the
fibula at the junction of the middle and distal thirds with residual
[DATE] shaft diameter of lateral and posterior displacement. Minimally
displaced medial malleolar fracture. Re- demonstrated displaced
posterior malleolar fracture. Reduction of posteriorly dislocated
talus.
IMPRESSION: 1. Placement of cast material
2. Slight decreased displacement of distal fibular fracture
3. Reduction of ankle dislocation. Re- demonstrated fractures of the
medial and posterior malleolus

## 2018-10-27 IMAGING — DX DG ANKLE COMPLETE 3+V*R*
3 series · 4 of 4 positions shown · non-contrast
Comparison: 08/16/2015

CLINICAL DATA: Stepped in hole today. Severe ankle pain and
deformity. Initial encounter.

EXAM:
RIGHT ANKLE - COMPLETE 3+ VIEW

[ankle ap]
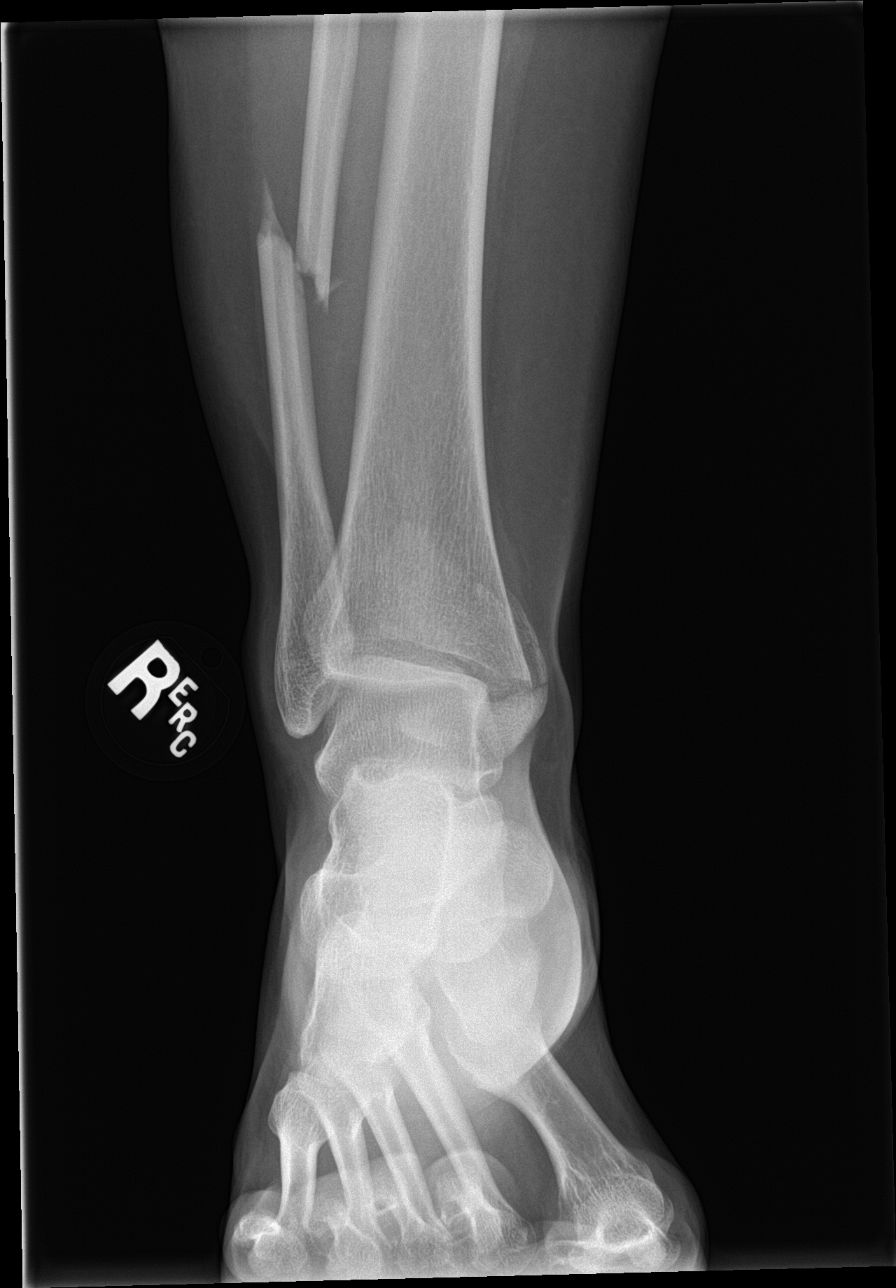

[Series 2: ankle obl · 0.14mm/px · 2 of 2 slices shown]
[im 1/2]
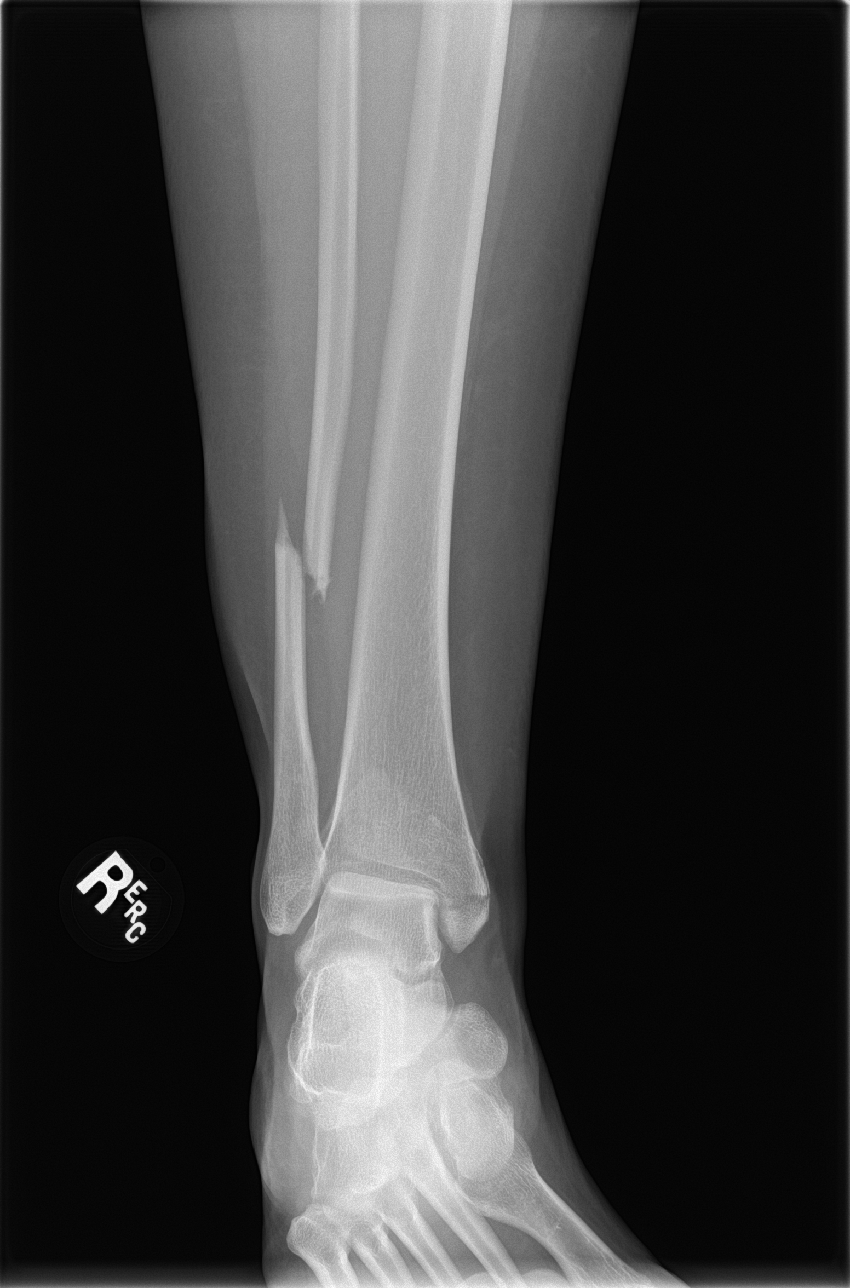
[im 2/2]
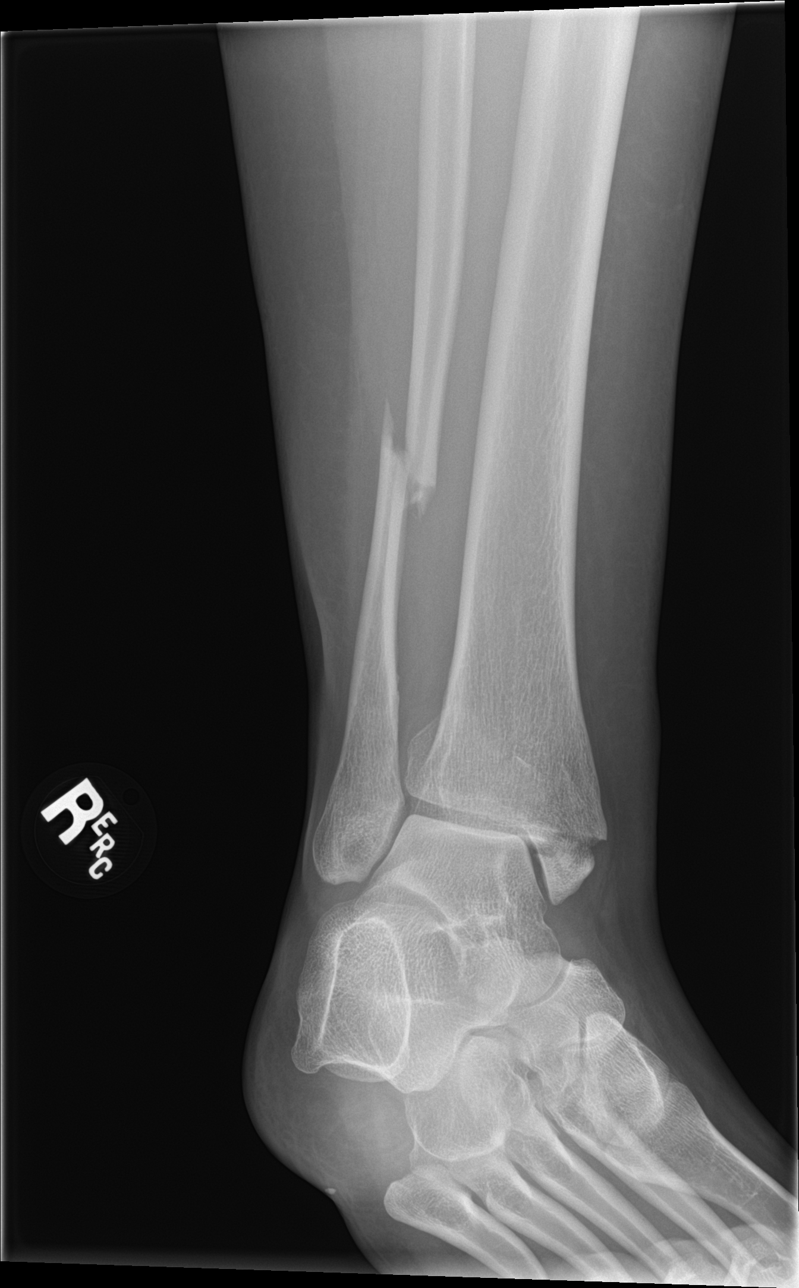

[ankle lat]
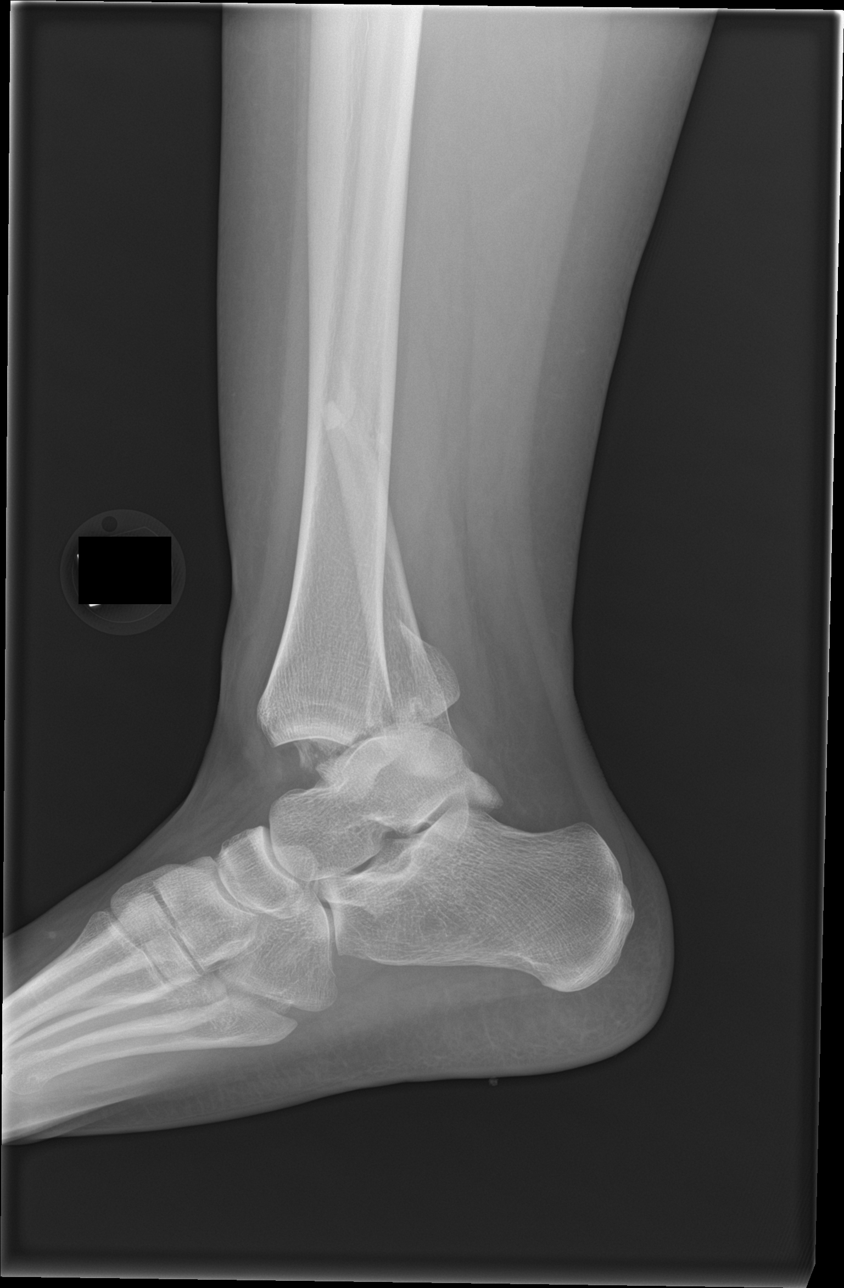

[4 of 4 positions shown; findings below may reference images not displayed]

FINDINGS: Displaced fractures are seen through the medial and posterior
malleoli of the distal tibia. There is also a displaced oblique
fracture of the distal fibular diaphysis. Posterior dislocation of
the talus is also seen.
IMPRESSION: Ankle fracture- dislocation, as described above .

## 2018-10-28 IMAGING — DX DG ANKLE 2V *R*
2 series · 2 of 2 positions shown · non-contrast
Comparison: the previous day's study

CLINICAL DATA: S/P right ankle ORIF

EXAM:
RIGHT ANKLE - 2 VIEW

[ankle ap]
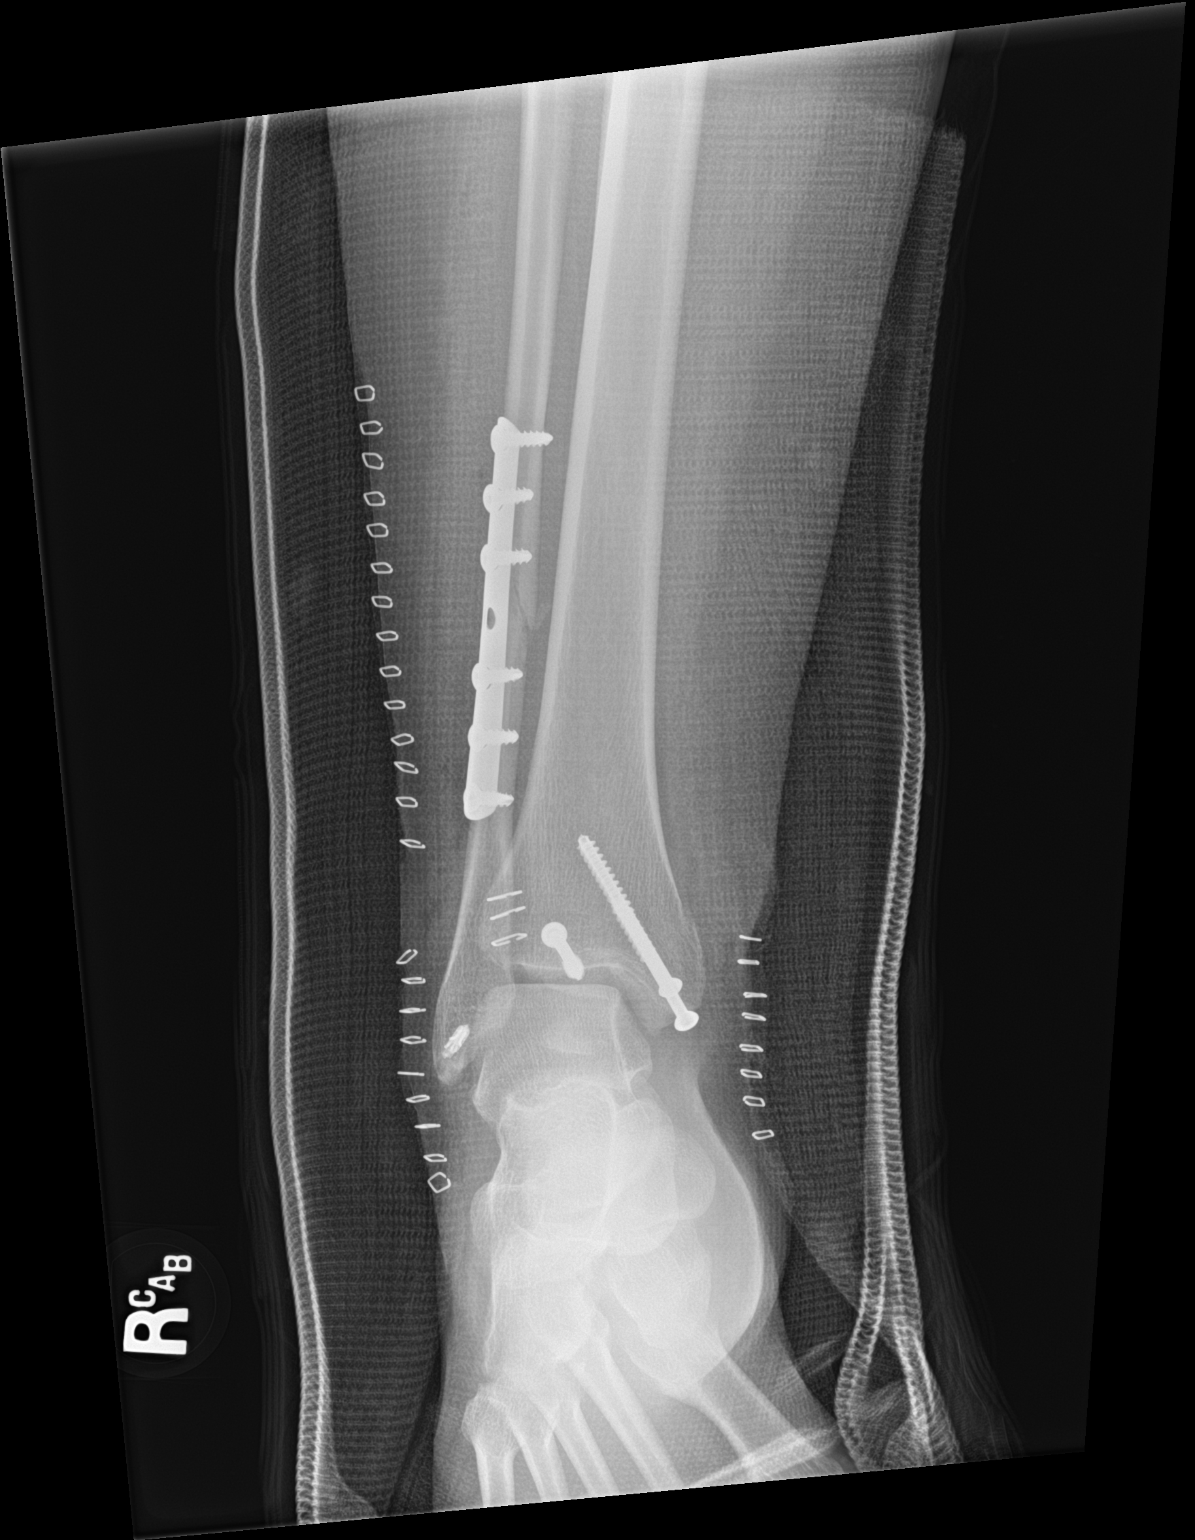

[ankle lat]
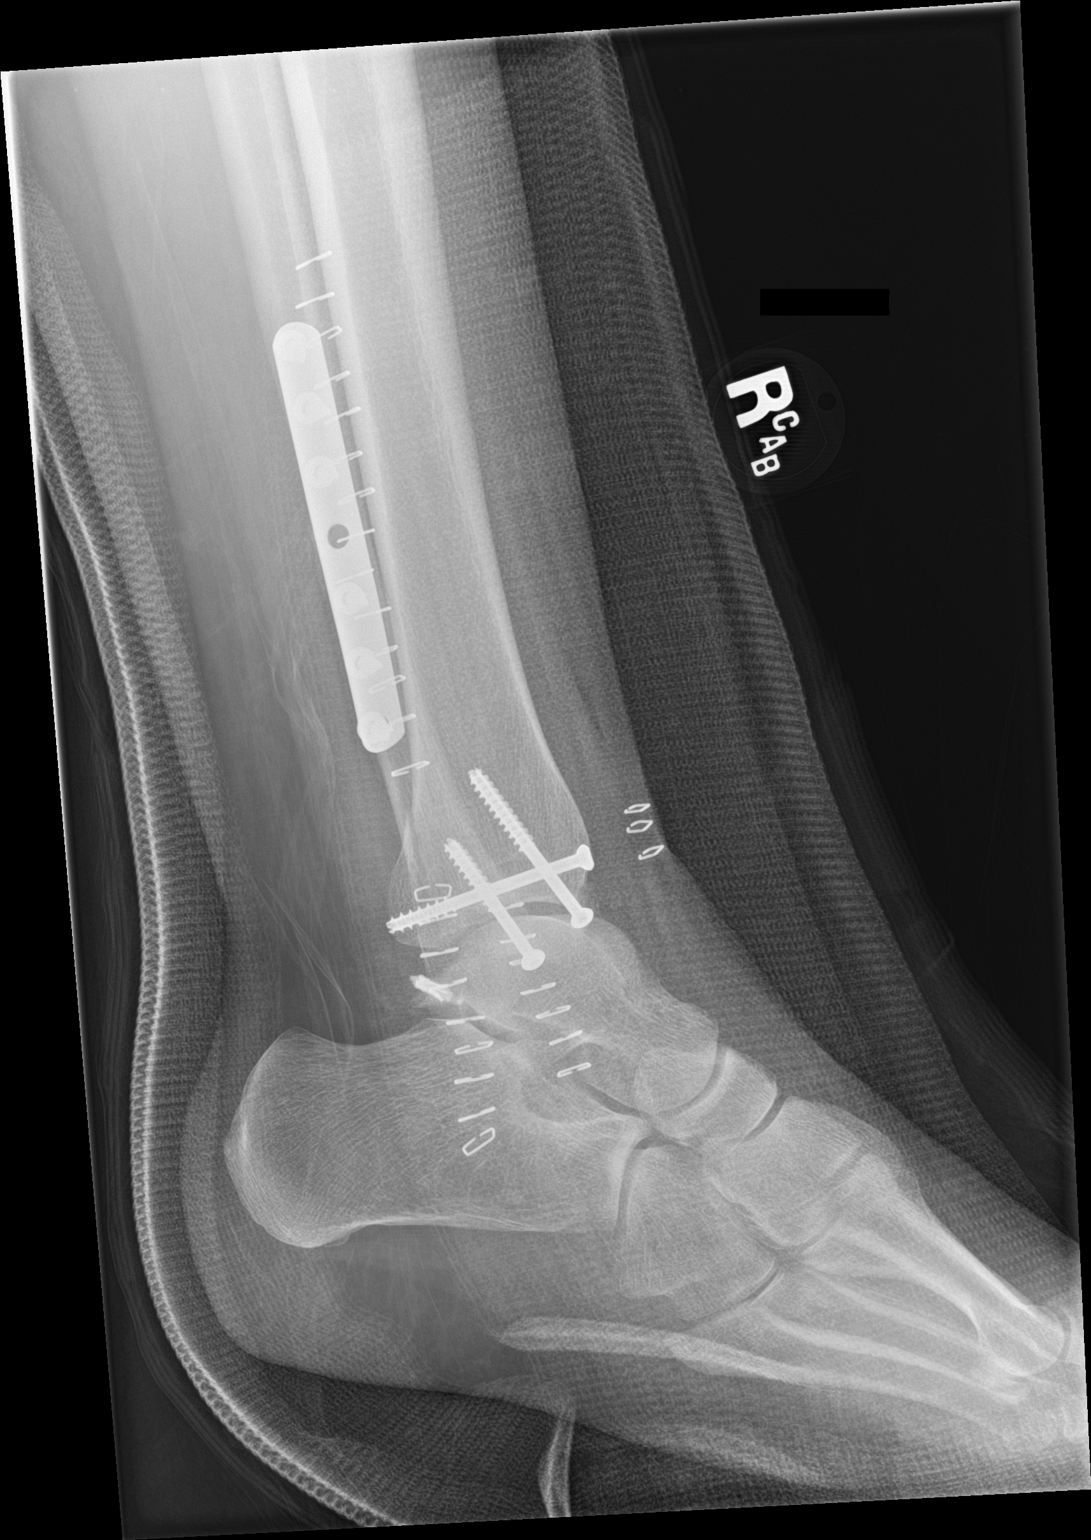

[2 of 2 positions shown; findings below may reference images not displayed]

FINDINGS: Interval plate and screw fixation of the distal fibular shaft
fracture. Orthopedic anchor in the lateral malleolus. Interval
orthopedic screw fixation of medial malleolar and posterior
malleolar fractures. Fragments in near anatomic alignment. Medial
and lateral skin staples. Overlying cast material obscures fine bone
detail.
IMPRESSION: 1. Interval internal fixation of  trimalleolar fracture.

## 2018-12-12 ENCOUNTER — Other Ambulatory Visit: Payer: Self-pay

## 2018-12-12 ENCOUNTER — Ambulatory Visit (INDEPENDENT_AMBULATORY_CARE_PROVIDER_SITE_OTHER): Payer: Self-pay | Admitting: Gastroenterology

## 2018-12-12 ENCOUNTER — Encounter: Payer: Self-pay | Admitting: Gastroenterology

## 2018-12-12 ENCOUNTER — Encounter: Payer: Self-pay | Admitting: *Deleted

## 2018-12-12 DIAGNOSIS — Z5329 Procedure and treatment not carried out because of patient's decision for other reasons: Secondary | ICD-10-CM

## 2018-12-13 ENCOUNTER — Ambulatory Visit: Payer: Medicaid Other | Admitting: Gastroenterology

## 2018-12-15 ENCOUNTER — Encounter: Payer: Self-pay | Admitting: Family Medicine

## 2018-12-19 NOTE — Telephone Encounter (Signed)
Called pt no answer, will call back

## 2018-12-21 ENCOUNTER — Other Ambulatory Visit: Payer: Self-pay

## 2018-12-21 ENCOUNTER — Ambulatory Visit (INDEPENDENT_AMBULATORY_CARE_PROVIDER_SITE_OTHER): Payer: Medicaid Other | Admitting: Family Medicine

## 2018-12-21 DIAGNOSIS — Z5321 Procedure and treatment not carried out due to patient leaving prior to being seen by health care provider: Secondary | ICD-10-CM

## 2018-12-21 NOTE — Progress Notes (Signed)
Called patient 3x at the pre-arranged time for her appointment. Left messages for her to call back. Was not seen today.

## 2019-01-11 ENCOUNTER — Encounter: Payer: Self-pay | Admitting: Family Medicine

## 2019-01-25 ENCOUNTER — Telehealth: Payer: Self-pay | Admitting: Family Medicine

## 2019-01-25 NOTE — Telephone Encounter (Signed)
Copied from [place] [id number]. Topic: Quick Communication - Rx Refill/Question >> Jan 25, 2019  5:10 PM Mcneil, Ja-Kwan wrote: Medication:  pregabalin (LYRICA) 150 MG capsule   Has the patient contacted their pharmacy? yes   Preferred Pharmacy (with phone number or street name): [id number] - [address], [place] [phone] (Phone)  [phone] (Fax)  Agent: Please be advised that RX refills may take up to 3 business days. We ask that you follow-up with your pharmacy.

## 2019-01-26 ENCOUNTER — Other Ambulatory Visit: Payer: Self-pay

## 2019-01-26 NOTE — Telephone Encounter (Signed)
[facility] faxed a Rx refill request on pregabalin 150 mg capsules

## 2019-01-26 NOTE — Telephone Encounter (Signed)
Responded to in another encounter.

## 2019-01-26 NOTE — Telephone Encounter (Signed)
Needs follow up appointment.  

## 2019-01-26 NOTE — Telephone Encounter (Signed)
Needs follow up appointment please.

## 2019-01-26 NOTE — Telephone Encounter (Addendum)
Called and spoke to patient. Patient stated that she's been seen by another provider and they did refill the Rx pregabalin. Routing to provider as an Pharmacist, hospital

## 2019-02-07 ENCOUNTER — Other Ambulatory Visit: Payer: Self-pay

## 2019-02-07 NOTE — Telephone Encounter (Signed)
Called and spoke to patient's mother. DPR was checked. Ellen Jackson stated pt switched providers and is not seeing preferred primary care.

## 2019-02-07 NOTE — Telephone Encounter (Signed)
Needs appointment

## 2020-04-30 ENCOUNTER — Other Ambulatory Visit: Payer: Self-pay | Admitting: Adult Health

## 2020-04-30 DIAGNOSIS — R609 Edema, unspecified: Secondary | ICD-10-CM

## 2020-04-30 DIAGNOSIS — M79661 Pain in right lower leg: Secondary | ICD-10-CM

## 2020-04-30 DIAGNOSIS — Z86718 Personal history of other venous thrombosis and embolism: Secondary | ICD-10-CM

## 2020-05-06 ENCOUNTER — Ambulatory Visit
Admission: RE | Admit: 2020-05-06 | Discharge: 2020-05-06 | Disposition: A | Discharge status: Home / Self Care | Payer: Medicaid Other | Source: Ambulatory Visit | Attending: Adult Health | Admitting: Adult Health

## 2020-05-06 ENCOUNTER — Other Ambulatory Visit: Payer: Self-pay

## 2020-05-06 DIAGNOSIS — M79661 Pain in right lower leg: Secondary | ICD-10-CM | POA: Insufficient documentation

## 2020-05-06 DIAGNOSIS — Z86718 Personal history of other venous thrombosis and embolism: Secondary | ICD-10-CM | POA: Insufficient documentation

## 2020-05-06 DIAGNOSIS — R609 Edema, unspecified: Secondary | ICD-10-CM | POA: Diagnosis present

## 2020-05-08 ENCOUNTER — Other Ambulatory Visit: Payer: Self-pay | Admitting: Adult Health

## 2020-05-08 DIAGNOSIS — M79661 Pain in right lower leg: Secondary | ICD-10-CM

## 2020-05-08 DIAGNOSIS — R600 Localized edema: Secondary | ICD-10-CM

## 2021-03-20 ENCOUNTER — Emergency Department
Admission: EM | Admit: 2021-03-20 | Discharge: 2021-03-20 | Disposition: A | Discharge status: Home / Self Care | Payer: Medicaid Other | Attending: Emergency Medicine | Admitting: Emergency Medicine

## 2021-03-20 ENCOUNTER — Other Ambulatory Visit: Payer: Self-pay

## 2021-03-20 DIAGNOSIS — Z5321 Procedure and treatment not carried out due to patient leaving prior to being seen by health care provider: Secondary | ICD-10-CM | POA: Insufficient documentation

## 2021-03-20 DIAGNOSIS — R21 Rash and other nonspecific skin eruption: Secondary | ICD-10-CM | POA: Insufficient documentation

## 2021-03-20 NOTE — ED Triage Notes (Signed)
Pt to ED for rash for past 2 days on upper back. States looks like acne but she does not have acne like that, worried about monkey pox.  Small red papules noted.  Pt in NAD, ambulatory
